# Patient Record
Sex: Male | Born: 1945 | Race: White | Hispanic: No | Marital: Married | Smoking: Former smoker
Health system: Southern US, Community
[De-identification: ages and names within clinical notes are randomized; demographics above are authoritative.]

## PROBLEM LIST (undated history)

## (undated) DIAGNOSIS — R911 Solitary pulmonary nodule: Secondary | ICD-10-CM

## (undated) DIAGNOSIS — I8390 Asymptomatic varicose veins of unspecified lower extremity: Secondary | ICD-10-CM

## (undated) DIAGNOSIS — I700 Atherosclerosis of aorta: Secondary | ICD-10-CM

## (undated) DIAGNOSIS — J449 Chronic obstructive pulmonary disease, unspecified: Secondary | ICD-10-CM

## (undated) DIAGNOSIS — T8859XA Other complications of anesthesia, initial encounter: Secondary | ICD-10-CM

## (undated) DIAGNOSIS — T4145XA Adverse effect of unspecified anesthetic, initial encounter: Secondary | ICD-10-CM

## (undated) DIAGNOSIS — I219 Acute myocardial infarction, unspecified: Secondary | ICD-10-CM

## (undated) DIAGNOSIS — Z87442 Personal history of urinary calculi: Secondary | ICD-10-CM

## (undated) DIAGNOSIS — E785 Hyperlipidemia, unspecified: Secondary | ICD-10-CM

## (undated) DIAGNOSIS — I2510 Atherosclerotic heart disease of native coronary artery without angina pectoris: Secondary | ICD-10-CM

## (undated) DIAGNOSIS — I4510 Unspecified right bundle-branch block: Secondary | ICD-10-CM

## (undated) DIAGNOSIS — Z951 Presence of aortocoronary bypass graft: Secondary | ICD-10-CM

## (undated) DIAGNOSIS — I10 Essential (primary) hypertension: Secondary | ICD-10-CM

## (undated) DIAGNOSIS — I739 Peripheral vascular disease, unspecified: Secondary | ICD-10-CM

## (undated) HISTORY — PX: EYE SURGERY: SHX253

## (undated) HISTORY — DX: Asymptomatic varicose veins of unspecified lower extremity: I83.90

## (undated) HISTORY — PX: CHOLECYSTECTOMY: SHX55

---

## 1995-07-14 HISTORY — PX: OTHER SURGICAL HISTORY: SHX169

## 2000-07-13 HISTORY — PX: SHOULDER SURGERY: SHX246

## 2002-11-03 ENCOUNTER — Emergency Department (HOSPITAL_COMMUNITY): Admission: EM | Admit: 2002-11-03 | Discharge: 2002-11-03 | Payer: Self-pay | Admitting: Internal Medicine

## 2002-11-03 ENCOUNTER — Encounter: Payer: Self-pay | Admitting: Internal Medicine

## 2003-03-23 ENCOUNTER — Inpatient Hospital Stay (HOSPITAL_COMMUNITY): Admission: EM | Admit: 2003-03-23 | Discharge: 2003-03-27 | Payer: Self-pay | Admitting: *Deleted

## 2003-03-23 ENCOUNTER — Encounter: Payer: Self-pay | Admitting: Emergency Medicine

## 2003-03-26 HISTORY — PX: CARDIAC CATHETERIZATION: SHX172

## 2011-04-06 ENCOUNTER — Ambulatory Visit: Payer: Self-pay | Admitting: Family Medicine

## 2012-03-08 ENCOUNTER — Ambulatory Visit: Payer: Self-pay | Admitting: Emergency Medicine

## 2012-10-18 ENCOUNTER — Emergency Department (HOSPITAL_COMMUNITY): Payer: Medicare Other

## 2012-10-18 ENCOUNTER — Encounter (HOSPITAL_COMMUNITY): Payer: Self-pay

## 2012-10-18 ENCOUNTER — Inpatient Hospital Stay (HOSPITAL_COMMUNITY)
Admission: EM | Admit: 2012-10-18 | Discharge: 2012-10-25 | DRG: 234 | Disposition: A | Discharge status: Home / Self Care | Payer: Medicare Other | Attending: Thoracic Surgery (Cardiothoracic Vascular Surgery) | Admitting: Thoracic Surgery (Cardiothoracic Vascular Surgery)

## 2012-10-18 DIAGNOSIS — I200 Unstable angina: Secondary | ICD-10-CM | POA: Diagnosis present

## 2012-10-18 DIAGNOSIS — Z79899 Other long term (current) drug therapy: Secondary | ICD-10-CM

## 2012-10-18 DIAGNOSIS — J4489 Other specified chronic obstructive pulmonary disease: Secondary | ICD-10-CM | POA: Diagnosis present

## 2012-10-18 DIAGNOSIS — D62 Acute posthemorrhagic anemia: Secondary | ICD-10-CM | POA: Diagnosis not present

## 2012-10-18 DIAGNOSIS — J449 Chronic obstructive pulmonary disease, unspecified: Secondary | ICD-10-CM | POA: Diagnosis present

## 2012-10-18 DIAGNOSIS — R079 Chest pain, unspecified: Secondary | ICD-10-CM

## 2012-10-18 DIAGNOSIS — F17200 Nicotine dependence, unspecified, uncomplicated: Secondary | ICD-10-CM | POA: Diagnosis present

## 2012-10-18 DIAGNOSIS — Z951 Presence of aortocoronary bypass graft: Secondary | ICD-10-CM

## 2012-10-18 DIAGNOSIS — I4949 Other premature depolarization: Secondary | ICD-10-CM | POA: Diagnosis not present

## 2012-10-18 DIAGNOSIS — E8779 Other fluid overload: Secondary | ICD-10-CM | POA: Diagnosis not present

## 2012-10-18 DIAGNOSIS — E785 Hyperlipidemia, unspecified: Secondary | ICD-10-CM | POA: Diagnosis present

## 2012-10-18 DIAGNOSIS — I10 Essential (primary) hypertension: Secondary | ICD-10-CM | POA: Diagnosis present

## 2012-10-18 DIAGNOSIS — I2582 Chronic total occlusion of coronary artery: Secondary | ICD-10-CM | POA: Diagnosis present

## 2012-10-18 DIAGNOSIS — Z7982 Long term (current) use of aspirin: Secondary | ICD-10-CM

## 2012-10-18 DIAGNOSIS — J90 Pleural effusion, not elsewhere classified: Secondary | ICD-10-CM | POA: Diagnosis not present

## 2012-10-18 DIAGNOSIS — I2510 Atherosclerotic heart disease of native coronary artery without angina pectoris: Principal | ICD-10-CM | POA: Diagnosis present

## 2012-10-18 HISTORY — DX: Chronic obstructive pulmonary disease, unspecified: J44.9

## 2012-10-18 HISTORY — DX: Hyperlipidemia, unspecified: E78.5

## 2012-10-18 HISTORY — DX: Presence of aortocoronary bypass graft: Z95.1

## 2012-10-18 HISTORY — DX: Essential (primary) hypertension: I10

## 2012-10-18 HISTORY — DX: Atherosclerotic heart disease of native coronary artery without angina pectoris: I25.10

## 2012-10-18 LAB — CBC WITH DIFFERENTIAL/PLATELET
Basophils Absolute: 0 10*3/uL (ref 0.0–0.1)
HCT: 46.8 % (ref 39.0–52.0)
Lymphocytes Relative: 18 % (ref 12–46)
Lymphs Abs: 1.5 10*3/uL (ref 0.7–4.0)
Monocytes Absolute: 0.7 10*3/uL (ref 0.1–1.0)
Neutro Abs: 6.1 10*3/uL (ref 1.7–7.7)
Platelets: 210 10*3/uL (ref 150–400)
RBC: 5.07 MIL/uL (ref 4.22–5.81)
RDW: 12.2 % (ref 11.5–15.5)
WBC: 8.5 10*3/uL (ref 4.0–10.5)

## 2012-10-18 LAB — BASIC METABOLIC PANEL
CO2: 31 mEq/L (ref 19–32)
Chloride: 103 mEq/L (ref 96–112)
Glucose, Bld: 103 mg/dL — ABNORMAL HIGH (ref 70–99)
Sodium: 140 mEq/L (ref 135–145)

## 2012-10-18 LAB — TROPONIN I
Troponin I: <0.3 ng/mL (ref <0.30)
Troponin I: <0.3 ng/mL (ref <0.30)
Troponin I: <0.3 ng/mL (ref <0.30)

## 2012-10-18 LAB — HEPARIN LEVEL (UNFRACTIONATED): Heparin Unfractionated: 0.18 IU/mL — ABNORMAL LOW (ref 0.30–0.70)

## 2012-10-18 MED ORDER — ASPIRIN 325 MG PO TABS: 325.0000 mg | ORAL_TABLET | Freq: Once | ORAL | Status: DC

## 2012-10-18 MED ORDER — ASPIRIN 300 MG RE SUPP
300.0000 mg | RECTAL | Status: AC
  Filled 2012-10-18: qty 1

## 2012-10-18 MED ORDER — ACETAMINOPHEN 325 MG PO TABS
650.0000 mg | ORAL_TABLET | ORAL | Status: DC | PRN
  Administered 2012-10-19 – 2012-10-20 (×3): 650 mg via ORAL
  Filled 2012-10-18 (×3): qty 2

## 2012-10-18 MED ORDER — ASPIRIN 81 MG PO CHEW: 324.0000 mg | CHEWABLE_TABLET | ORAL | Status: AC

## 2012-10-18 MED ORDER — ONDANSETRON HCL 4 MG/2ML IJ SOLN: 4.0000 mg | Freq: Four times a day (QID) | INTRAMUSCULAR | Status: DC | PRN

## 2012-10-18 MED ORDER — ATORVASTATIN CALCIUM 20 MG PO TABS
20.0000 mg | ORAL_TABLET | Freq: Every day | ORAL | Status: DC
  Administered 2012-10-18 – 2012-10-19 (×2): 20 mg via ORAL
  Filled 2012-10-18 (×3): qty 1

## 2012-10-18 MED ORDER — SODIUM CHLORIDE 0.9 % IV SOLN
1.0000 mL/kg/h | INTRAVENOUS | Status: DC
Start: 2012-10-19 — End: 2012-10-19
  Administered 2012-10-19: 1 mL/kg/h via INTRAVENOUS

## 2012-10-18 MED ORDER — AMLODIPINE BESYLATE 5 MG PO TABS
5.0000 mg | ORAL_TABLET | Freq: Every day | ORAL | Status: DC
  Administered 2012-10-18: 5 mg via ORAL
  Filled 2012-10-18 (×3): qty 1

## 2012-10-18 MED ORDER — METOPROLOL TARTRATE 25 MG PO TABS
25.0000 mg | ORAL_TABLET | Freq: Every day | ORAL | Status: DC
  Administered 2012-10-18: 25 mg via ORAL
  Filled 2012-10-18 (×3): qty 1

## 2012-10-18 MED ORDER — NITROGLYCERIN IN D5W 200-5 MCG/ML-% IV SOLN
5.0000 ug/min | INTRAVENOUS | Status: DC
  Administered 2012-10-18: 5 ug/min via INTRAVENOUS
  Filled 2012-10-18: qty 250

## 2012-10-18 MED ORDER — HEPARIN BOLUS VIA INFUSION
4000.0000 [IU] | Freq: Once | INTRAVENOUS | Status: AC
  Administered 2012-10-18: 4000 [IU] via INTRAVENOUS

## 2012-10-18 MED ORDER — HEPARIN BOLUS VIA INFUSION
2500.0000 [IU] | Freq: Once | INTRAVENOUS | Status: AC
  Administered 2012-10-18: 2500 [IU] via INTRAVENOUS
  Filled 2012-10-18: qty 2500

## 2012-10-18 MED ORDER — SODIUM CHLORIDE 0.9 % IJ SOLN: 3.0000 mL | INTRAMUSCULAR | Status: DC | PRN

## 2012-10-18 MED ORDER — HEPARIN (PORCINE) IN NACL 100-0.45 UNIT/ML-% IJ SOLN
10.0000 [IU]/kg/h | Freq: Once | INTRAMUSCULAR | Status: AC
  Administered 2012-10-18: 10 [IU]/kg/h via INTRAVENOUS
  Filled 2012-10-18: qty 250

## 2012-10-18 MED ORDER — SODIUM CHLORIDE 0.9 % IJ SOLN
3.0000 mL | Freq: Two times a day (BID) | INTRAMUSCULAR | Status: DC
  Administered 2012-10-18: 3 mL via INTRAVENOUS

## 2012-10-18 MED ORDER — HEPARIN (PORCINE) IN NACL 100-0.45 UNIT/ML-% IJ SOLN
1250.0000 [IU]/h | INTRAMUSCULAR | Status: DC
  Administered 2012-10-18 – 2012-10-19 (×2): 1250 [IU]/h via INTRAVENOUS
  Filled 2012-10-18 (×3): qty 250

## 2012-10-18 MED ORDER — ASPIRIN 81 MG PO CHEW
324.0000 mg | CHEWABLE_TABLET | ORAL | Status: AC
  Administered 2012-10-19: 324 mg via ORAL
  Filled 2012-10-18: qty 4

## 2012-10-18 MED ORDER — ISOSORBIDE MONONITRATE ER 30 MG PO TB24
30.0000 mg | ORAL_TABLET | Freq: Every day | ORAL | Status: DC
  Administered 2012-10-18: 30 mg via ORAL
  Filled 2012-10-18 (×3): qty 1

## 2012-10-18 MED ORDER — SODIUM CHLORIDE 0.9 % IV SOLN: 250.0000 mL | INTRAVENOUS | Status: DC | PRN

## 2012-10-18 MED ORDER — QUINAPRIL HCL 10 MG PO TABS
20.0000 mg | ORAL_TABLET | Freq: Every day | ORAL | Status: DC
  Administered 2012-10-18 – 2012-10-19 (×2): 20 mg via ORAL
  Filled 2012-10-18 (×3): qty 2

## 2012-10-18 NOTE — Progress Notes (Signed)
Utilization Review Completed.[name] T4/02/2013

## 2012-10-18 NOTE — Progress Notes (Addendum)
ANTICOAGULATION CONSULT NOTE - Initial Consult  Pharmacy Consult for Heparin Indication: chest pain/ACS  No Known Allergies  Patient Measurements: Height: 5\' 7"  (170.2 cm) Weight: 210 lb (95.255 kg) IBW/kg (Calculated) : 66.1 Heparin Dosing Weight: 86.4kg  Vital Signs: Temp: 97.7 F (36.5 C) (04/08 1516) Temp src: Oral (04/08 1700) BP: 158/90 mmHg (04/08 1700) Pulse Rate: 70 (04/08 1700)  Labs:  Recent Labs  10/18/12 1029  HGB 15.5  HCT 46.8  PLT 210  CREATININE 0.80  TROPONINI <0.30    Estimated Creatinine Clearance: 97.3 ml/min (by C-G formula based on Cr of 0.8).   Medical History: Past Medical History  Diagnosis Date  . Coronary artery disease   . Hypertension     Medications:  Prescriptions prior to admission  Medication Sig Dispense Refill  . amLODipine (NORVASC) 5 MG tablet Take 5 mg by mouth daily.      Marland Kitchen aspirin 81 MG chewable tablet Chew 81 mg by mouth daily.      Marland Kitchen atorvastatin (LIPITOR) 20 MG tablet Take 20 mg by mouth daily.      . isosorbide mononitrate (IMDUR) 30 MG 24 hr tablet Take 30 mg by mouth daily.      . metoprolol (LOPRESSOR) 50 MG tablet Take 25 mg by mouth daily.      . nitroGLYCERIN (NITROSTAT) 0.4 MG SL tablet Place 0.4 mg under the tongue every 5 (five) minutes as needed for chest pain.      Marland Kitchen quinapril (ACCUPRIL) 20 MG tablet Take 20 mg by mouth at bedtime.        Assessment: 66yom continuing heparin for CP/ACS. Patient was started on heparin (4000 unit bolus, then 950 units/hr) at [facility] ~ 1330 today. Patient reports no bleeding and not on any anticoagulants at home.  - H/H and Plts wnl - No baseline INR - Heparin weight: 86.4kg - CrCl 97 ml/min  Goal of Therapy:  Heparin level 0.3-0.7 units/ml Monitor platelets by anticoagulation protocol: Yes   Plan:  1. Continue heparin drip 950 units/hr (9.5 ml/hr) 2. Check heparin level 6 hours after initiation (~1930) 3. Daily heparin level and CBC  Cleon Dew [phone] 10/18/2012,5:20 PM   Addendum: Initial heparin level (0.18) is subtherapeutic for ACS dosing - will bolus and increase heparin rate. - No problems with line/infusion per RN  Plan: 1. Heparin IV bolus 2500 units x 1 2. Increase heparin drip to 1250 units/hr (12.5 ml/hr) 3. Check heparin level 6 hours after rate increase  Wilfred Lacy, PharmD Clinical Pharmacist [phone] 10/18/2012, 8:26 PM

## 2012-10-18 NOTE — ED Provider Notes (Signed)
History     This chart was scribed for Donnetta Hutching, MD, MD by Smitty Pluck, ED Scribe. The patient was seen in room APA07/APA07 and the patient's care was started at 10:50 AM.   CSN: [id number]  Arrival date & time 10/18/12  1010       Chief Complaint  Patient presents with  . Chest Pain     The history is provided by the patient, medical records and a relative. No language interpreter was used.   [name] is a 67 y.o. male with h/o CAD and HTN who presents to the Emergency Department via EMS complaining of intermittent, sharp, substernal chest pain radiating to neck for 2-3 weeks. 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 He reports that excersion aggravates the pain. He reports hx of catheretization in 2004 by [facility] showing a blockage of uncertain anatomical location and severity. He denies hx of stents, MI, stroke and any other heart complications. He states that he takes nitro sometimes but it is very rare. Pt denies diaphoresis, fever, chills, nausea, vomiting, diarrhea, weakness, cough, SOB and any other pain. He reports that he smokes cigarettes.    Cardiologist is Dr. Rennis Golden    Past Medical History  Diagnosis Date  . Coronary artery disease   . Hypertension     Past Surgical History  Procedure Laterality Date  . Cholecystectomy      No family history on file.  History  Substance Use Topics  . Smoking status: Current Every Day Smoker  . Smokeless tobacco: Not on file  . Alcohol Use: No      Review of Systems 10 Systems reviewed and all are negative for acute change except as noted in the HPI.   Allergies  Review of patient's allergies indicates no known allergies.  Home Medications  No current outpatient prescriptions on file.  BP 159/88  Pulse 65  Temp(Src) 97.9 F (36.6 C) (Oral)  Resp 20  Ht 5\' 7"  (1.702 m)  Wt 210 lb (95.255 kg)  BMI 32.88 kg/m2  SpO2  96%  Physical Exam  Nursing note and vitals reviewed. Constitutional: He is oriented to person, place, and time. He appears well-developed and well-nourished.  HENT:  Head: Normocephalic and atraumatic.  Eyes: Conjunctivae and EOM are normal. Pupils are equal, round, and reactive to light.  Neck: Normal range of motion. Neck supple.  Cardiovascular: Normal rate, regular rhythm and normal heart sounds.   Pulmonary/Chest: Effort normal and breath sounds normal.  Abdominal: Soft. Bowel sounds are normal.  Musculoskeletal: Normal range of motion.  Neurological: He is alert and oriented to person, place, and time.  Skin: Skin is warm and dry.  Psychiatric: He has a normal mood and affect.    ED Course  Procedures (including critical care time) DIAGNOSTIC STUDIES: Oxygen Saturation is 96% on [place], adequate by my interpretation.    COORDINATION OF CARE: 10:57 AM Discussed ED treatment with pt and pt agrees.    Results for orders placed during the hospital encounter of 10/18/12  CBC WITH DIFFERENTIAL      Result Value Range   WBC 8.5  4.0 - 10.5 K/uL   RBC 5.07  4.22 - 5.81 MIL/uL   Hemoglobin 15.5  13.0 - 17.0 g/dL   HCT 40.9  81.1 - 91.4 %   MCV 92.3  78.0 - 100.0 fL   MCH 30.6  26.0 - 34.0 pg  MCHC 33.1  30.0 - 36.0 g/dL   RDW 46.9  62.9 - 52.8 %   Platelets 210  150 - 400 K/uL   Neutrophils Relative 72  43 - 77 %   Neutro Abs 6.1  1.7 - 7.7 K/uL   Lymphocytes Relative 18  12 - 46 %   Lymphs Abs 1.5  0.7 - 4.0 K/uL   Monocytes Relative 8  3 - 12 %   Monocytes Absolute 0.7  0.1 - 1.0 K/uL   Eosinophils Relative 2  0 - 5 %   Eosinophils Absolute 0.2  0.0 - 0.7 K/uL   Basophils Relative 0  0 - 1 %   Basophils Absolute 0.0  0.0 - 0.1 K/uL  BASIC METABOLIC PANEL      Result Value Range   Sodium 140  135 - 145 mEq/L   Potassium 4.0  3.5 - 5.1 mEq/L   Chloride 103  96 - 112 mEq/L   CO2 31  19 - 32 mEq/L   Glucose, Bld 103 (*) 70 - 99 mg/dL   BUN 9  6 - 23 mg/dL    Creatinine, Ser 4.13  0.50 - 1.35 mg/dL   Calcium 9.1  8.4 - 24.4 mg/dL   GFR calc non Af Amer 90 (*) >90 mL/min   GFR calc Af Amer >90  >90 mL/min  TROPONIN I      Result Value Range   Troponin I <0.30  <0.30 ng/mL     Dg Chest Portable 1 View  10/18/2012  *RADIOLOGY REPORT*  Clinical Data: Chest pain.  Current history of hypertension and coronary artery disease.  PORTABLE CHEST - 1 VIEW 1014 1030 hours:  Comparison: None.  Findings: Cardiac silhouette enlarged.  Thoracic aorta atherosclerotic.  Hilar and mediastinal contours otherwise unremarkable.  Lungs clear.  Bronchovascular markings normal. Pulmonary vascularity normal.  No pneumothorax.  No pleural effusions.  Elevation of the right hemidiaphragm which is likely chronic.  IMPRESSION: Cardiomegaly.  No acute cardiopulmonary disease.   Original Report Authenticated By: Hulan Saas, M.D.      No diagnosis found.  Date: 10/18/2012  Rate: 65  Rhythm: normal sinus rhythm  QRS Axis: rightward  Intervals: normal  ST/T Wave abnormalities: normal  Conduction Disutrbances: none  Narrative Interpretation: unremarkable  CRITICAL CARE Performed by: Donnetta Hutching  ?  Total critical care time: 30  Critical care time was exclusive of separately billable procedures and treating other patients.  Critical care was necessary to treat or prevent imminent or life-threatening deterioration.  Critical care was time spent personally by me on the following activities: development of treatment plan with patient and/or surrogate as well as nursing, discussions with consultants, evaluation of patient's response to treatment, examination of patient, obtaining history from patient or surrogate, ordering and performing treatments and interventions, ordering and review of laboratory studies, ordering and review of radiographic studies, pulse oximetry and re-evaluation of patient's condition.    MDM  Patient with known coronary artery disease and risk  factors presents with substernal chest pain for 3 weeks, worse with exertion. EKG and troponin negative. Discussed with Dr. Allyson Sabal.  IV nitroglycerin and IV heparin started. Transfer to [facility].   I personally performed the services described in this documentation, which was scribed in my presence. The recorded information has been reviewed and is accurate.       Donnetta Hutching, MD 10/18/12 [id number]

## 2012-10-18 NOTE — H&P (Signed)
[name] is an 67 y.o. male.   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  He has a history of CAD, HTN, dyslipidemia, varicose veins with LEE and kidney stones.  He had a coronary angiogram in 2004 which showed an occluded RCA with left to right collaterals.  He also had a 60% lesion in the mid circumflex and 40% in the OM.  He presented to [facility] with chest pain.   He reports progressive anginal symptoms for the last 3-4 weeks which are responsive to NTG.  He reports radiation to neck and jaw.  He denies N, V, fever, diaphoresis, dizziness, orthopnea, PND, cough congestion, Abd pain, hematuria, hematochezia, melena.   EKG wit subtle ST depression in V5-6.  Medications: Prior to Admission medications   Medication Sig Start Date End Date Taking? Authorizing Provider  amLODipine (NORVASC) 5 MG tablet Take 5 mg by mouth daily.   Yes Historical Provider, MD  aspirin 81 MG chewable tablet Chew 81 mg by mouth daily.   Yes Historical Provider, MD  atorvastatin (LIPITOR) 20 MG tablet Take 20 mg by mouth daily.   Yes Historical Provider, MD  isosorbide mononitrate (IMDUR) 30 MG 24 hr tablet Take 30 mg by mouth daily.   Yes Historical Provider, MD  metoprolol (LOPRESSOR) 50 MG tablet Take 25 mg by mouth daily.   Yes Historical Provider, MD  nitroGLYCERIN (NITROSTAT) 0.4 MG SL tablet Place 0.4 mg under the tongue every 5 (five) minutes as needed for chest pain.   Yes Historical Provider, MD  quinapril (ACCUPRIL) 20 MG tablet Take 20 mg by mouth at bedtime.   Yes Historical Provider, MD    Past Medical History  Diagnosis Date  . Coronary artery disease   . Hypertension     Past Surgical History  Procedure Laterality Date  . Cholecystectomy      No family history on file. Social History:  reports that he has been smoking.  He does not have any smokeless tobacco history on file. He reports that he does not drink alcohol  or use illicit drugs.  Allergies: No Known Allergies   (Not in a hospital admission)  Results for orders placed during the hospital encounter of 10/18/12 (from the past 48 hour(s))  CBC WITH DIFFERENTIAL     Status: None   Collection Time    10/18/12 10:29 AM      Result Value Range   WBC 8.5  4.0 - 10.5 K/uL   RBC 5.07  4.22 - 5.81 MIL/uL   Hemoglobin 15.5  13.0 - 17.0 g/dL   HCT 16.1  09.6 - 04.5 %   MCV 92.3  78.0 - 100.0 fL   MCH 30.6  26.0 - 34.0 pg   MCHC 33.1  30.0 - 36.0 g/dL   RDW 40.9  81.1 - 91.4 %   Platelets 210  150 - 400 K/uL   Neutrophils Relative 72  43 - 77 %   Neutro Abs 6.1  1.7 - 7.7 K/uL   Lymphocytes Relative 18  12 - 46 %   Lymphs Abs 1.5  0.7 - 4.0 K/uL   Monocytes Relative 8  3 - 12 %   Monocytes Absolute 0.7  0.1 - 1.0 K/uL   Eosinophils Relative 2  0 - 5 %   Eosinophils Absolute 0.2  0.0 - 0.7 K/uL   Basophils Relative 0  0 - 1 %   Basophils Absolute 0.0  0.0 - 0.1 K/uL  BASIC METABOLIC PANEL     Status: Abnormal   Collection Time    10/18/12 10:29 AM      Result Value Range   Sodium 140  135 - 145 mEq/L   Potassium 4.0  3.5 - 5.1 mEq/L   Chloride 103  96 - 112 mEq/L   CO2 31  19 - 32 mEq/L   Glucose, Bld 103 (*) 70 - 99 mg/dL   BUN 9  6 - 23 mg/dL   Creatinine, Ser 4.74  0.50 - 1.35 mg/dL   Calcium 9.1  8.4 - 25.9 mg/dL   GFR calc non Af Amer 90 (*) >90 mL/min   GFR calc Af Amer >90  >90 mL/min   Comment:            The eGFR has been calculated     using the CKD EPI equation.     This calculation has not been     validated in all clinical     situations.     eGFR's persistently     <90 mL/min signify     possible Chronic Kidney Disease.  TROPONIN I     Status: None   Collection Time    10/18/12 10:29 AM      Result Value Range   Troponin I <0.30  <0.30 ng/mL   Comment:            Due to the release kinetics of cTnI,     a negative result within the first hours     of the onset of symptoms does not rule out     myocardial  infarction with certainty.     If myocardial infarction is still suspected,     repeat the test at appropriate intervals.   Dg Chest Portable 1 View  10/18/2012  *RADIOLOGY REPORT*  Clinical Data: Chest pain.  Current history of hypertension and coronary artery disease.  PORTABLE CHEST - 1 VIEW 1014 1030 hours:  Comparison: None.  Findings: Cardiac silhouette enlarged.  Thoracic aorta atherosclerotic.  Hilar and mediastinal contours otherwise unremarkable.  Lungs clear.  Bronchovascular markings normal. Pulmonary vascularity normal.  No pneumothorax.  No pleural effusions.  Elevation of the right hemidiaphragm which is likely chronic.  IMPRESSION: Cardiomegaly.  No acute cardiopulmonary disease.   Original Report Authenticated By: Hulan Saas, M.D.     Review of Systems  Constitutional: Negative for fever and diaphoresis.  HENT: Positive for neck pain. Negative for congestion and sore throat.   Respiratory: Negative for cough and shortness of breath.   Cardiovascular: Positive for chest pain and leg swelling. Negative for orthopnea and PND.  Gastrointestinal: Negative for nausea, vomiting, abdominal pain, diarrhea, constipation, blood in stool and melena.  Genitourinary: Negative for dysuria and hematuria.  Neurological: Negative for dizziness.    Blood pressure 152/77, pulse 68, temperature 97.9 F (36.6 C), temperature source Oral, resp. rate 20, height 5\' 7"  (1.702 m), weight 95.255 kg (210 lb), SpO2 96.00%. Physical Exam  Constitutional: He is oriented to person, place, and time. He appears well-developed and well-nourished. No distress.  HENT:  Head: Normocephalic and atraumatic.  Eyes: EOM are normal. Pupils are equal, round, and reactive to light.  Neck: Normal range of motion. Neck supple. No JVD present.  Cardiovascular: Normal rate, regular rhythm and S1 normal.  Exam reveals gallop and S3.   No murmur heard. Pulses:  Radial pulses are 2+ on the right side, and 2+ on the  left side.       Dorsalis pedis pulses are 2+ on the right side, and 2+ on the left side.  No Carotid Bruits  Respiratory: Effort normal. He has wheezes (Left base).  GI: Soft. Bowel sounds are normal. He exhibits no distension. There is no tenderness.  Musculoskeletal: He exhibits no edema.  Lymphadenopathy:    He has no cervical adenopathy.  Neurological: He is alert and oriented to person, place, and time. He exhibits normal muscle tone.  Skin: Skin is warm and dry.  Psychiatric: He has a normal mood and affect.     Assessment/Plan  Principal Problem:   Chest pain Active Problems:   HTN (hypertension)   Dyslipidemia   CAD (coronary artery disease)  Plan:  The patient was transferred to [facility] stepdown on IV heparin and NTG.  We will have the lab draw troponin x3 Q6hr, TSH, lipids, A1C.  EKG in the morning.   Left heart cath tomorrow.    Wilburt Finlay 10/18/2012, 2:47 PM    Agree with note written by Jones Skene PAC  Pt with known CAD by cath 10 years ago, + CRF including ongoing tobacco abuse and recent crescendo CP c/w [place]. Currently pain free on iv hep/ntg. Exam benign. Labs OK. Enz neg. EKG w/o acute change. Plan cardiac cath Wed. Pt agreeable.   Runell Gess 10/19/2012 5:45 AM

## 2012-10-18 NOTE — ED Notes (Addendum)
Pt reports had cath done in 2004.  Reports has had very little chest pain since then.  Reports once in a while would have some chest pain with exertion.  Reports for the past couple of weeks has had chest pain more frequently.  Reports has taken more nitro in the past 2 weeks than he has since 2004.  EMS reports pt took one nitro at home and went to his doctor's office.   His pcp gave him one nitro and 4 baby aspirins  in the office today and called EMS.    BP 158/80.  HR 69, 100% on 2liters.  PT says pain has been radiating into both jaws.

## 2012-10-19 ENCOUNTER — Encounter (HOSPITAL_COMMUNITY): Payer: Self-pay | Admitting: Thoracic Surgery (Cardiothoracic Vascular Surgery)

## 2012-10-19 ENCOUNTER — Inpatient Hospital Stay (HOSPITAL_COMMUNITY): Payer: Medicare Other

## 2012-10-19 ENCOUNTER — Encounter (HOSPITAL_COMMUNITY)
Admission: EM | Disposition: A | Discharge status: Home / Self Care | Payer: Self-pay | Source: Home / Self Care | Attending: Thoracic Surgery (Cardiothoracic Vascular Surgery)

## 2012-10-19 ENCOUNTER — Other Ambulatory Visit: Payer: Self-pay | Admitting: *Deleted

## 2012-10-19 DIAGNOSIS — Z01810 Encounter for preprocedural cardiovascular examination: Secondary | ICD-10-CM

## 2012-10-19 DIAGNOSIS — I2510 Atherosclerotic heart disease of native coronary artery without angina pectoris: Secondary | ICD-10-CM

## 2012-10-19 DIAGNOSIS — F17200 Nicotine dependence, unspecified, uncomplicated: Secondary | ICD-10-CM | POA: Diagnosis present

## 2012-10-19 HISTORY — PX: LEFT HEART CATHETERIZATION WITH CORONARY ANGIOGRAM: SHX5451

## 2012-10-19 LAB — CBC
HCT: 44.5 % (ref 39.0–52.0)
Hemoglobin: 14.9 g/dL (ref 13.0–17.0)
MCV: 91.6 fL (ref 78.0–100.0)
RDW: 12.3 % (ref 11.5–15.5)
WBC: 10.6 10*3/uL — ABNORMAL HIGH (ref 4.0–10.5)

## 2012-10-19 LAB — URINALYSIS, ROUTINE W REFLEX MICROSCOPIC
Bilirubin Urine: NEGATIVE
Hgb urine dipstick: NEGATIVE
Ketones, ur: NEGATIVE mg/dL
Specific Gravity, Urine: 1.012 (ref 1.005–1.030)
Urobilinogen, UA: 1 mg/dL (ref 0.0–1.0)
pH: 6.5 (ref 5.0–8.0)

## 2012-10-19 LAB — BASIC METABOLIC PANEL
BUN: 10 mg/dL (ref 6–23)
CO2: 29 mEq/L (ref 19–32)
Calcium: 8.6 mg/dL (ref 8.4–10.5)
Creatinine, Ser: 0.82 mg/dL (ref 0.50–1.35)
Glucose, Bld: 97 mg/dL (ref 70–99)

## 2012-10-19 LAB — HEPARIN LEVEL (UNFRACTIONATED): Heparin Unfractionated: 0.42 IU/mL (ref 0.30–0.70)

## 2012-10-19 LAB — LIPID PANEL
HDL: 33 mg/dL — ABNORMAL LOW (ref >39)
LDL Cholesterol: 67 mg/dL (ref 0–99)
Total CHOL/HDL Ratio: 3.5 RATIO
Triglycerides: 83 mg/dL (ref <150)
VLDL: 17 mg/dL (ref 0–40)

## 2012-10-19 LAB — TROPONIN I: Troponin I: <0.3 ng/mL (ref <0.30)

## 2012-10-19 LAB — TSH: TSH: 0.985 u[IU]/mL (ref 0.350–4.500)

## 2012-10-19 LAB — POCT ACTIVATED CLOTTING TIME: Activated Clotting Time: 138 seconds

## 2012-10-19 LAB — HEMOGLOBIN A1C: Hgb A1c MFr Bld: 5.5 % (ref <5.7)

## 2012-10-19 LAB — SURGICAL PCR SCREEN: Staphylococcus aureus: NEGATIVE

## 2012-10-19 LAB — TYPE AND SCREEN: ABO/RH(D): A POS

## 2012-10-19 SURGERY — LEFT HEART CATHETERIZATION WITH CORONARY ANGIOGRAM
Anesthesia: LOCAL

## 2012-10-19 MED ORDER — EPINEPHRINE HCL 1 MG/ML IJ SOLN
0.5000 ug/min | INTRAVENOUS | Status: DC
  Filled 2012-10-19: qty 4

## 2012-10-19 MED ORDER — VANCOMYCIN HCL 10 G IV SOLR
1250.0000 mg | INTRAVENOUS | Status: AC
  Administered 2012-10-20: 1250 mg via INTRAVENOUS
  Filled 2012-10-19: qty 1250

## 2012-10-19 MED ORDER — CHLORHEXIDINE GLUCONATE 4 % EX LIQD
60.0000 mL | Freq: Once | CUTANEOUS | Status: AC
  Administered 2012-10-19: 4 via TOPICAL
  Filled 2012-10-19: qty 60

## 2012-10-19 MED ORDER — FENTANYL CITRATE 0.05 MG/ML IJ SOLN
INTRAMUSCULAR | Status: AC
  Filled 2012-10-19: qty 2

## 2012-10-19 MED ORDER — SODIUM CHLORIDE 0.9 % IJ SOLN
3.0000 mL | Freq: Two times a day (BID) | INTRAMUSCULAR | Status: DC
  Administered 2012-10-19: 19:00:00 via INTRAVENOUS

## 2012-10-19 MED ORDER — LIDOCAINE HCL (PF) 1 % IJ SOLN
INTRAMUSCULAR | Status: AC
  Filled 2012-10-19: qty 30

## 2012-10-19 MED ORDER — PHENYLEPHRINE HCL 10 MG/ML IJ SOLN
30.0000 ug/min | INTRAVENOUS | Status: DC
  Filled 2012-10-19: qty 2

## 2012-10-19 MED ORDER — VANCOMYCIN HCL 1000 MG IV SOLR
INTRAVENOUS | Status: AC
  Administered 2012-10-20: 12:00:00
  Filled 2012-10-19: qty 1000

## 2012-10-19 MED ORDER — SODIUM CHLORIDE 0.9 % IV SOLN
INTRAVENOUS | Status: AC
  Administered 2012-10-20: 1 [IU]/h via INTRAVENOUS
  Filled 2012-10-19: qty 1

## 2012-10-19 MED ORDER — HEPARIN (PORCINE) IN NACL 2-0.9 UNIT/ML-% IJ SOLN
INTRAMUSCULAR | Status: AC
  Filled 2012-10-19: qty 1000

## 2012-10-19 MED ORDER — HEPARIN (PORCINE) IN NACL 100-0.45 UNIT/ML-% IJ SOLN
1250.0000 [IU]/h | INTRAMUSCULAR | Status: DC
Start: 2012-10-19 — End: 2012-10-20
  Administered 2012-10-19: 1250 [IU]/h via INTRAVENOUS
  Filled 2012-10-19: qty 250

## 2012-10-19 MED ORDER — OXYCODONE-ACETAMINOPHEN 5-325 MG PO TABS: 1.0000 | ORAL_TABLET | ORAL | Status: DC | PRN

## 2012-10-19 MED ORDER — NITROGLYCERIN IN D5W 200-5 MCG/ML-% IV SOLN
2.0000 ug/min | INTRAVENOUS | Status: AC
  Administered 2012-10-20 (×2): 10 ug/min via INTRAVENOUS
  Filled 2012-10-19: qty 250

## 2012-10-19 MED ORDER — SODIUM CHLORIDE 0.9 % IV SOLN
INTRAVENOUS | Status: AC
  Administered 2012-10-20: 14 mL/h via INTRAVENOUS
  Filled 2012-10-19: qty 40

## 2012-10-19 MED ORDER — DEXTROSE 5 % IV SOLN
750.0000 mg | INTRAVENOUS | Status: DC
  Filled 2012-10-19 (×2): qty 750

## 2012-10-19 MED ORDER — MIDAZOLAM HCL 2 MG/2ML IJ SOLN
INTRAMUSCULAR | Status: AC
  Filled 2012-10-19: qty 2

## 2012-10-19 MED ORDER — CHLORHEXIDINE GLUCONATE 4 % EX LIQD
60.0000 mL | Freq: Once | CUTANEOUS | Status: AC
  Administered 2012-10-20: 4 via TOPICAL
  Filled 2012-10-19 (×2): qty 60

## 2012-10-19 MED ORDER — PLASMA-LYTE 148 IV SOLN
INTRAVENOUS | Status: AC
  Administered 2012-10-20: 10:00:00
  Filled 2012-10-19: qty 2.5

## 2012-10-19 MED ORDER — DEXMEDETOMIDINE HCL IN NACL 400 MCG/100ML IV SOLN
0.1000 ug/kg/h | INTRAVENOUS | Status: AC
  Administered 2012-10-20: 0.2 ug/kg/h via INTRAVENOUS
  Filled 2012-10-19: qty 100

## 2012-10-19 MED ORDER — ALBUTEROL SULFATE (5 MG/ML) 0.5% IN NEBU
2.5000 mg | INHALATION_SOLUTION | Freq: Once | RESPIRATORY_TRACT | Status: AC
  Administered 2012-10-19: 2.5 mg via RESPIRATORY_TRACT

## 2012-10-19 MED ORDER — POTASSIUM CHLORIDE 2 MEQ/ML IV SOLN
80.0000 meq | INTRAVENOUS | Status: DC
  Filled 2012-10-19: qty 40

## 2012-10-19 MED ORDER — SODIUM CHLORIDE 0.9 % IJ SOLN
3.0000 mL | INTRAMUSCULAR | Status: DC | PRN
  Administered 2012-10-19: 19:00:00 via INTRAVENOUS

## 2012-10-19 MED ORDER — CEFUROXIME SODIUM 1.5 G IJ SOLR
1.5000 g | INTRAMUSCULAR | Status: AC
  Administered 2012-10-20: 1.5 g via INTRAVENOUS
  Administered 2012-10-20: .75 g via INTRAVENOUS
  Filled 2012-10-19: qty 1.5

## 2012-10-19 MED ORDER — BISACODYL 5 MG PO TBEC: 5.0000 mg | DELAYED_RELEASE_TABLET | Freq: Once | ORAL | Status: DC

## 2012-10-19 MED ORDER — METOPROLOL TARTRATE 12.5 MG HALF TABLET
12.5000 mg | ORAL_TABLET | Freq: Once | ORAL | Status: AC
  Administered 2012-10-20: 12.5 mg via ORAL
  Filled 2012-10-19: qty 1

## 2012-10-19 MED ORDER — MAGNESIUM SULFATE 50 % IJ SOLN
40.0000 meq | INTRAMUSCULAR | Status: DC
  Filled 2012-10-19: qty 10

## 2012-10-19 MED ORDER — SODIUM CHLORIDE 0.9 % IV SOLN: 1.0000 mL/kg/h | INTRAVENOUS | Status: AC

## 2012-10-19 MED ORDER — SODIUM CHLORIDE 0.9 % IV SOLN: 250.0000 mL | INTRAVENOUS | Status: DC | PRN

## 2012-10-19 MED ORDER — DOPAMINE-DEXTROSE 3.2-5 MG/ML-% IV SOLN
2.0000 ug/kg/min | INTRAVENOUS | Status: DC
  Filled 2012-10-19: qty 250

## 2012-10-19 MED ORDER — TEMAZEPAM 15 MG PO CAPS: 15.0000 mg | ORAL_CAPSULE | Freq: Once | ORAL | Status: AC | PRN

## 2012-10-19 MED ORDER — NITROGLYCERIN IN D5W 200-5 MCG/ML-% IV SOLN
2.0000 ug/min | INTRAVENOUS | Status: AC
  Administered 2012-10-19: 20 ug/min via INTRAVENOUS

## 2012-10-19 NOTE — Progress Notes (Signed)
ANTICOAGULATION CONSULT NOTE - Follow-Up Consult  Pharmacy Consult for Heparin Indication: chest pain/ACS  No Known Allergies  Patient Measurements: Height: 5\' 7"  (170.2 cm) Weight: 208 lb 15.9 oz (94.8 kg) IBW/kg (Calculated) : 66.1 Heparin Dosing Weight: 86.4kg  Vital Signs: Temp: 97.6 F (36.4 C) (04/09 1140) Temp src: Oral (04/09 1140) BP: 144/81 mmHg (04/09 1230) Pulse Rate: 63 (04/09 1245)  Labs:  Recent Labs  10/18/12 1029 10/18/12 1731 10/18/12 1913 10/18/12 2213 10/19/12 0455  HGB 15.5  --   --   --  14.9  HCT 46.8  --   --   --  44.5  PLT 210  --   --   --  214  LABPROT  --   --   --   --  13.4  INR  --   --   --   --  1.03  HEPARINUNFRC  --   --  0.18*  --  0.42  CREATININE 0.80  --   --   --  0.82  TROPONINI <0.30 <0.30  --  <0.30 <0.30    Estimated Creatinine Clearance: 94.6 ml/min (by C-G formula based on Cr of 0.82).   Medical History: Past Medical History  Diagnosis Date  . Coronary artery disease   . Hypertension     Medications:  Prescriptions prior to admission  Medication Sig Dispense Refill  . amLODipine (NORVASC) 5 MG tablet Take 5 mg by mouth daily.      Marland Kitchen aspirin 81 MG chewable tablet Chew 81 mg by mouth daily.      Marland Kitchen atorvastatin (LIPITOR) 20 MG tablet Take 20 mg by mouth daily.      . isosorbide mononitrate (IMDUR) 30 MG 24 hr tablet Take 30 mg by mouth daily.      . metoprolol (LOPRESSOR) 50 MG tablet Take 25 mg by mouth daily.      . nitroGLYCERIN (NITROSTAT) 0.4 MG SL tablet Place 0.4 mg under the tongue every 5 (five) minutes as needed for chest pain.      Marland Kitchen quinapril (ACCUPRIL) 20 MG tablet Take 20 mg by mouth at bedtime.       . sodium chloride    . heparin    . nitroGLYCERIN 10 mcg/min (10/19/12 0440)  . [DISCONTINUED] sodium chloride 1 mL/kg/hr (10/19/12 0353)  . [DISCONTINUED] heparin 1,250 Units/hr (10/19/12 0353)    Assessment: 66yom to resume heparin post-cath due to CAD, CABG referral.  Heparin level previously  therapeutic this AM on 1250 units/hr.  Heparin was turned off prior to cath lab.  Per RN, groin site looks okay so far.  No other bleeding or complications noted, CBC stable.  Sheath pulled in cath lab holding area at 1040 AM.  Goal of Therapy:  Heparin level 0.3-0.7 units/ml Monitor platelets by anticoagulation protocol: Yes   Plan:  1. Will resume IV heparin at 1845 PM at 1250 units/hr.  No bolus. 2. Check a heparin level 6 hrs after gtt resumes. 3. Daily heparin level and CBC. 4. F/U for plans for CABG.  Tad Moore, BCPS  Clinical Pharmacist Pager [phone]  10/19/2012 12:52 PM

## 2012-10-19 NOTE — Progress Notes (Signed)
PFT completed at bedside w [facility] @ 27 degrees due to Femoral Cath requirements. Unconfirmed results to be  placed in Shadow Chart.

## 2012-10-19 NOTE — Progress Notes (Addendum)
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  Left:  No evidence of hemodynamically significant internal carotid artery stenosis.  Bilateral:  Vertebral artery flow is antegrade.     [name] , RVT 10/19/2012 1:21 PM     Upper Extremity Right Left  Brachial Pressures 147  Triphasic  147  Triphasic   Radial Waveforms Triphasic  Triphasic   Ulnar Waveforms Monophasic  Monophasic   Palmar Arch (Allen's Test) Doppler obliterates with radial compression, normal with ulnar compression. Doppler obliterates with radial compression, normal with ulnar compression.   [name], RVT 10/19/2012 4:44 PM

## 2012-10-19 NOTE — CV Procedure (Addendum)
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 He has a history of CAD, HTN, dyslipidemia, varicose veins with LEE and kidney stones. He had a coronary angiogram in 2004 which showed an occluded RCA with left to right collaterals. He also had a 60% lesion in the mid circumflex and 40% in the OM. He presented to [name] with chest pain. He reports progressive anginal symptoms for the last 3-4 weeks which are responsive to NTG. He reports radiation to neck and jaw. He denies N, V, fever, diaphoresis, dizziness, orthopnea, PND, cough congestion, Abd pain, hematuria, hematochezia, melena. EKG wit subtle ST depression in V5-6.  Consent: The procedure with Risks/Benefits/Alternatives and Indications were reviewed with the patient (and family).  All questions were answered.    Risks / Complications include, but not limited to: Death, MI, CVA/TIA, VF/VT (with defibrillation), Bradycardia (need for temporary pacer placement), contrast induced nephropathy, bleeding / bruising / hematoma / pseudoaneurysm, vascular or coronary injury (with possible emergent CT or Vascular Surgery), adverse medication reactions, infection.    Consent: Risks of procedure as well as the alternatives and risks of each were explained to the (patient/caregiver).  Consent for procedure obtained.  Procedure: The patient was brought to the 2nd Floor [name] in the fasting state and prepped and draped in the usual sterile fashion for (Left groin access).    Time Out: Verified patient identification, verified procedure, site/side was marked, verified correct patient position, special equipment/implants available, radiation safety measures in place (including badges and shielding), medications/allergies/relevent history reviewed, required imaging and test results available.  Performed  Procedure: The right femoral head was identified using tactile and fluoroscopic technique.  The right groin was anesthetized with 1% subcutaneous Lidocaine.  The right Common Femoral Artery was accessed using the Modified Seldinger Technique. The wire was advanced however it was noted to have some resistance.  Fluoroscopy demonstrated the wire was tortuous with a 90 degree bend that blocked advancement of the dilator and sheath.  The wire was then withdrawn and direct pressure was held on the right femoral artery for 10 minutes.  Once hemostasis was achieved, the left groin was prepped and draped in the usual sterile fashion. Using the same described technique, the vessel was easily accessed and placement of (5 Fr) sheath was performed using the Seldinger technique.  The sheath was aspirated and flushed.  A 5 Fr JL4 Catheter was advanced of over a Standard J wire into the ascending Aorta.  The catheter was used to engage the left coronary artery.  Multiple cineangiographic views of the left coronary artery system(s) were performed. The right coronary artery was not engaged as it is known to be occluded.  This catheter was then exchanged over the Standard J wire for an angled Pigtail catheter that was advanced across the Aortic Valve.  LV hemodynamics were measured (and Left Ventriculography was performed).  LV hemodynamics were then re-sampled, and the catheter was pulled back across the Aortic Valve for  measurement of "pull-back" gradient.  The catheter and the  wire was removed completely out of the body. The patient was transferred to the holding area where the sheath was removed with manual pressure held for hemostasis.   Recovery: The patient was transported to the cath lab holding area in stable condition.   The patient  was stable before, during and following the procedure.   Patient did tolerate procedure well. There were not complications.  EBL: Minimal  Medications:  Premedication: None  Sedation:  1 mg IV Versed, 50 mcg IV Fentanyl  Contrast:  100 ml Omnipaque  5 cc 1% lidocaine  Hemodynamics:  Central Aortic Pressure / Mean Aortic Pressure: 130/71  LV Pressure / LV End diastolic Pressure:  20  Left Ventriculography:  EF:  55-60%  Wall Motion: Normal wall motion, 1+ MR  Coronary Angiographic Data:  Left Main:  Large caliber 4.21mm vessel, tapers distally to >90% stenosis.  Left Anterior Descending (LAD):  There is 95% ostial stenosis of the LAD which reaches the apex. There is diffuse mild stenosis of the LAD. There are 3 large septal perforator branches which give off collaterals to the right coronary artery system.  1st diagonal (D1): Large diagonal branch which does not appear to have significant stenosis.  Circumflex (LCx):  There is a 95% ostial bifurcation stenosis.  1st obtuse marginal:  Large mid-vessel branch which has minor luminal irregularities.  2nd obtuse marginal:  Distal moderate sized branch without significant stenosis.  Ramus Intermedius:  Small ramus branch with severe ostial stenosis.  Right Coronary Artery: (Not engaged due to known mid-vessel occlusion)  Impression: 1.  Severe distal LM / ostial LAD/LCX bifurcation stenosis. 2.  Known occluded RCA with left to right collaterals, appears on old films to be a smaller, probably non-dominant vessel. 3.  LVEF 55-60%, 1+ MR, normal wall motion 4.  LVEDP = 20 mmHg  Plan: 1.  Mr. [name] has surgical disease with a preserved LVEF.  Would benefit most from  CABG. 2.  Family and patient have requested Dr. Cornel[name] - I will contact him for consultation. 3.  Bedrest for 5 hours to keep both legs straight. Return to 2900.  Keep on heparin and nitroglycerin.   The case and results was discussed with the patient and family if available.  The case and results was not discussed with the patient's PCP. The case and results was discussed with the patient's Cardiologist.  Time Spent Directly with the Patient:  60 minutes  Levi Nose, MD, [facility] Attending Cardiologist [name]  Gaylon Bentz C 10/19/2012, 10:42 AM

## 2012-10-19 NOTE — Consult Note (Signed)
CARDIOTHORACIC SURGERY CONSULTATION REPORT  PCP is Leanna Sato, MD Referring Provider is Chrystie Nose, MD   Reason for consultation:  Left main disease, 3-vessel coronary artery disease  HPI:  Patient is a 67 year old male with known history of coronary artery disease, hypertension, hyperlipidemia, and long-standing tobacco abuse. The patient underwent cardiac catheterization in 2004 with symptoms of stable angina and was found to have chronic occlusion of the right coronary artery with left-to-right collaterals and insignificant disease in the left coronary system. He was treated medically. He has done well until approximately 3 or 4 weeks ago when he first began to develop classical symptoms of exertional angina. At the time he was helping a friend frame a house and he began to experience some substernal chest pain radiating to the jaw that was usually promptly relieved by either rest or sublingual nitroglycerin. Over the last 3 weeks symptoms continued to accelerate until yesterday when the patient developed more severe episode of pain just while walking a short distance. The pain again was relieved by sublingual nitroglycerin.  He subsequently presented to the emergency department at [facility] where EKG was performed demonstrating subtle ST segment depression in the anterolateral leads. Troponin levels have remained negative. He underwent diagnostic cardiac catheterization earlier today by Dr. Rennis Golden demonstrating left main disease with severe three-vessel coronary artery disease.  Left ventricular function is fairly well preserved.  Cardiothoracic surgical consultation was requested.  Patient reports that he otherwise has been feeling well. He denies any problems with exertional shortness of breath, resting shortness of breath, PND, orthopnea, or lower extremity edema. He has not had palpitations no syncope.  Past Medical History  Diagnosis Date  . Coronary artery  disease   . Hypertension   . Dyslipidemia 10/18/2012    Past Surgical History  Procedure Laterality Date  . Cholecystectomy      No family history on file.  History   Social History  . Marital Status: Married    Spouse Name: N/A    Number of Children: N/A  . Years of Education: N/A   Occupational History  . Not on file.   Social History Main Topics  . Smoking status: Current Every Day Smoker  . Smokeless tobacco: Not on file  . Alcohol Use: No  . Drug Use: No  . Sexually Active: Not on file   Other Topics Concern  . Not on file   Social History Narrative  . No narrative on file    Prior to Admission medications   Medication Sig Start Date End Date Taking? Authorizing Provider  amLODipine (NORVASC) 5 MG tablet Take 5 mg by mouth daily.   Yes Historical Provider, MD  aspirin 81 MG chewable tablet Chew 81 mg by mouth daily.   Yes Historical Provider, MD  atorvastatin (LIPITOR) 20 MG tablet Take 20 mg by mouth daily.   Yes Historical Provider, MD  isosorbide mononitrate (IMDUR) 30 MG 24 hr tablet Take 30 mg by mouth daily.   Yes Historical Provider, MD  metoprolol (LOPRESSOR) 50 MG tablet Take 25 mg by mouth daily.   Yes Historical Provider, MD  nitroGLYCERIN (NITROSTAT) 0.4 MG SL tablet Place 0.4 mg under the tongue every 5 (five) minutes as needed for chest pain.   Yes Historical Provider, MD  quinapril (ACCUPRIL) 20 MG tablet Take 20 mg by mouth at bedtime.   Yes Historical Provider, MD    Current Facility-Administered Medications  Medication Dose Route Frequency  Provider Last Rate Last Dose  . 0.9 %  sodium chloride infusion  250 mL Intravenous PRN Chrystie Nose, MD      . acetaminophen (TYLENOL) tablet 650 mg  650 mg Oral Q4H PRN Wilburt Finlay, PA-C   650 mg at 10/19/12 1252  . [START ON 10/20/2012] aminocaproic acid (AMICAR) 10 g in sodium chloride 0.9 % 100 mL infusion   Intravenous To OR Runell Gess, MD      . amLODipine (NORVASC) tablet 5 mg  5 mg Oral  Daily Wilburt Finlay, PA-C   5 mg at 10/18/12 1755  . atorvastatin (LIPITOR) tablet 20 mg  20 mg Oral q1800 Wilburt Finlay, PA-C   20 mg at 10/18/12 1755  . [START ON 10/20/2012] cefUROXime (ZINACEF) 1.5 g in dextrose 5 % 50 mL IVPB  1.5 g Intravenous To OR Runell Gess, MD      . Melene [facility] ON 10/20/2012] cefUROXime (ZINACEF) 750 mg in dextrose 5 % 50 mL IVPB  750 mg Intravenous To OR Runell Gess, MD      . Melene [facility] ON 10/20/2012] dexmedetomidine (PRECEDEX) 400 MCG/100ML infusion  0.1-0.7 mcg/kg/hr Intravenous To OR Runell Gess, MD      . Melene [facility] ON 10/20/2012] DOPamine (INTROPIN) 800 mg in dextrose 5 % 250 mL infusion  2-20 mcg/kg/min Intravenous To OR Runell Gess, MD      . Melene [facility] ON 10/20/2012] EPINEPHrine (ADRENALIN) 4,000 mcg in dextrose 5 % 250 mL infusion  0.5-20 mcg/min Intravenous To OR Runell Gess, MD      . Melene [facility] ON 10/20/2012] heparin 2,500 Units, papaverine 30 mg in electrolyte-148 (PLASMALYTE-148) 500 mL irrigation   Irrigation To OR Runell Gess, MD      . heparin ADULT infusion 100 units/mL (25000 units/250 mL)  1,250 Units/hr Intravenous Continuous Purcell Nails, MD      . Melene [facility]er ON 10/20/2012] insulin regular (NOVOLIN R,HUMULIN R) 1 Units/mL in sodium chloride 0.9 % 100 mL infusion   Intravenous To OR Runell Gess, MD      . isosorbide mononitrate (IMDUR) 24 hr tablet 30 mg  30 mg Oral Daily Wilburt Finlay, PA-C   30 mg at 10/18/12 1755  . [START ON 10/20/2012] magnesium sulfate (IV Push/IM) injection 40 mEq  40 mEq Other To OR Runell Gess, MD      . metoprolol tartrate (LOPRESSOR) tablet 25 mg  25 mg Oral Daily Wilburt Finlay, PA-C   25 mg at 10/18/12 1757  . nitroGLYCERIN 0.2 mg/mL in dextrose 5 % infusion  5 mcg/min Intravenous Titrated Donnetta Hutching, MD 3 mL/hr at 10/19/12 0440 10 mcg/min at 10/19/12 0440  . [START ON 10/20/2012] nitroGLYCERIN 0.2 mg/mL in dextrose 5 % infusion  2-200 mcg/min Intravenous To OR Runell Gess, MD      . ondansetron [facility]) injection 4  mg  4 mg Intravenous Q6H PRN Wilburt Finlay, PA-C      . oxyCODONE-acetaminophen (PERCOCET/ROXICET) 5-325 MG per tablet 1-2 tablet  1-2 tablet Oral Q4H PRN Chrystie Nose, MD      . Melene Muller ON 10/20/2012] phenylephrine (NEO-SYNEPHRINE) 20,000 mcg in dextrose 5 % 250 mL infusion  30-200 mcg/min Intravenous To OR Runell Gess, MD      . Melene Muller ON 10/20/2012] potassium chloride injection 80 mEq  80 mEq Other To OR Runell Gess, MD      . quinapril (ACCUPRIL) tablet 20 mg  20 mg Oral QHS Wilburt Finlay, PA-C  20 mg at 10/18/12 2140  . sodium chloride 0.9 % injection 3 mL  3 mL Intravenous Q12H Chrystie Nose, MD      . sodium chloride 0.9 % injection 3 mL  3 mL Intravenous PRN Chrystie Nose, MD      . Melene Muller ON 10/20/2012] vancomycin (VANCOCIN) 1,000 mg in sodium chloride 0.9 % 1,000 mL irrigation   Irrigation To OR Runell Gess, MD      . Melene Muller ON 10/20/2012] vancomycin (VANCOCIN) 1,250 mg in sodium chloride 0.9 % 250 mL IVPB  1,250 mg Intravenous To OR Runell Gess, MD        No Known Allergies    Review of Systems:   General:  normal appetite, normal energy, no weight gain, no weight loss, no fever  Cardiac:  + chest pain with exertion, + chest pain at rest, no SOB with exertion, no resting SOB, no PND, no orthopnea, no palpitations, no arrhythmia, no atrial fibrillation, no LE edema, no dizzy spells, no syncope  Respiratory:  no shortness of breath, no home oxygen, no productive cough, + occasional dry cough, no bronchitis, no wheezing, no hemoptysis, no asthma, no pain with inspiration or cough, no sleep apnea, no CPAP at night  GI:   no difficulty swallowing, no reflux, no frequent heartburn, no hiatal hernia, no abdominal pain, no constipation, no diarrhea, no hematochezia, no hematemesis, no melena  GU:   no dysuria,  no frequency, no urinary tract infection, no hematuria, no enlarged prostate, no kidney stones, no kidney disease  Vascular:  no pain suggestive of claudication,  no pain in feet, no leg cramps, + varicose veins, no DVT, no non-healing foot ulcer  Neuro:   no stroke, no TIA's, no seizures, no headaches, no temporary blindness one eye,  no slurred speech, no peripheral neuropathy, no chronic pain, no instability of gait, no memory/cognitive dysfunction  Musculoskeletal: no arthritis, no joint swelling, no myalgias, no difficulty walking, normal mobility   Skin:   no rash, no itching, no skin infections, no pressure sores or ulcerations  Psych:   no anxiety, no depression, no nervousness, no unusual recent stress  Eyes:   no blurry vision, no floaters, no recent vision changes, + wears glasses or contacts  ENT:   no hearing loss, no loose or painful teeth, no dentures  Hematologic:  no easy bruising, no abnormal bleeding, no clotting disorder, no frequent epistaxis  Endocrine:  no diabetes, does not check CBG's at home     Physical Exam:   BP 149/73  Pulse 65  Temp(Src) 97.6 F (36.4 C) (Oral)  Resp 21  Ht 5\' 7"  (1.702 m)  Wt 94.8 kg (208 lb 15.9 oz)  BMI 32.73 kg/m2  SpO2 92%  General:  Mildly obese,  well-appearing  HEENT:  Unremarkable   Neck:   no JVD, no bruits, no adenopathy   Chest:   clear to auscultation, symmetrical breath sounds, no wheezes, no rhonchi   CV:   RRR, no  murmur   Abdomen:  soft, non-tender, no masses   Extremities:  warm, well-perfused, pulses palpable, mild-moderate varicosities both lower legs  Rectal/GU  Deferred  Neuro:   Grossly non-focal and symmetrical throughout  Skin:   Clean and dry, no rashes, no breakdown  Diagnostic Tests:  CARDIAC CATHETERIZATION REPORT  [name] [id number]  11-15-1945  Performing Cardiologist: Chrystie Nose  Primary Physician: Leanna Sato, MD  Primary Cardiologist: [place]  Procedures Performed:  Left Heart Catheterization  via 5 Fr right femoral artery access  Left Ventriculography, (RAO/LAO) 15 ml/sec for 30 ml total contrast  Native Coronary Angiography  Internal  Mammary Graft Angiography  Saphenous Vein Graft / Free Radial Artery Graft Angiography Indication(s): unstable angina  Pre-Procedural Non-invasive testing: known CAD with occluded RCA and left to right collaterals  History: 67 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 He has a history of CAD, HTN, dyslipidemia, varicose veins with LEE and kidney stones. He had a coronary angiogram in 2004 which showed an occluded RCA with left to right collaterals. He also had a 60% lesion in the mid circumflex and 40% in the OM. He presented to [facility] with chest pain. He reports progressive anginal symptoms for the last 3-4 weeks which are responsive to NTG. He reports radiation to neck and jaw. He denies N, V, fever, diaphoresis, dizziness, orthopnea, PND, cough congestion, Abd pain, hematuria, hematochezia, melena. EKG wit subtle ST depression in V5-6.  Consent: The procedure with Risks/Benefits/Alternatives and Indications were reviewed with the patient (and family). All questions were answered.  Risks / Complications include, but not limited to: Death, MI, CVA/TIA, VF/VT (with defibrillation), Bradycardia (need for temporary pacer placement), contrast induced nephropathy, bleeding / bruising / hematoma / pseudoaneurysm, vascular or coronary injury (with possible emergent CT or Vascular Surgery), adverse medication reactions, infection.  Consent:  Risks of procedure as well as the alternatives and risks of each were explained to the (patient/caregiver). Consent for procedure obtained.  Procedure: The patient was brought to the 2nd Floor [facility] in the fasting state and prepped and draped in the usual sterile fashion for (Left groin access).  Time Out: Verified patient identification, verified procedure, site/side was marked, verified correct patient position, special equipment/implants available, radiation safety measures in place (including badges and shielding),  medications/allergies/relevent history reviewed, required imaging and test results available. Performed  Procedure:  The right femoral head was identified using tactile and fluoroscopic technique. The right groin was anesthetized with 1% subcutaneous Lidocaine. The right Common Femoral Artery was accessed using the Modified Seldinger Technique. The wire was advanced however it was noted to have some resistance. Fluoroscopy demonstrated the wire was tortuous with a 90 degree bend that blocked advancement of the dilator and sheath. The wire was then withdrawn and direct pressure was held on the right femoral artery for 10 minutes. Once hemostasis was achieved, the left groin was prepped and draped in the usual sterile fashion. Using the same described technique, the vessel was easily accessed and placement of (5 Fr) sheath was performed using the Seldinger technique. The sheath was aspirated and flushed. A 5 Fr JL4 Catheter was advanced of over a Standard J wire into the ascending Aorta. The catheter was used to engage the left coronary artery. Multiple cineangiographic views of the left coronary artery system(s) were performed. The right coronary artery was not engaged as it is known to be occluded. This catheter was then exchanged over the Standard J wire for an angled Pigtail catheter that was advanced across the Aortic Valve. LV hemodynamics were measured (and Left Ventriculography was performed). LV hemodynamics were then re-sampled, and the catheter was pulled back across the Aortic Valve for measurement of "pull-back" gradient. The catheter and the wire was removed completely out of the body. The patient was transferred to the holding area where the sheath was removed with manual pressure held for hemostasis.  Recovery:  The patient was transported to the  cath lab holding area in stable condition.  The patient was stable before, during and following the procedure.  Patient did tolerate procedure well.    There were not complications.  EBL: Minimal  Medications:  Premedication: None  Sedation: 1 mg IV Versed, 50 mcg IV Fentanyl  Contrast: 100 ml Omnipaque  5 cc 1% lidocaine  Hemodynamics:  Central Aortic Pressure / Mean Aortic Pressure: 130/71  LV Pressure / LV End diastolic Pressure: 20  Left Ventriculography:  EF: 55-60%  Wall Motion: Normal wall motion, 1+ MR  Coronary Angiographic Data:  Left Main: Large caliber 4.75mm vessel, tapers distally to >90% stenosis.  Left Anterior Descending (LAD): There is 95% ostial stenosis of the LAD which reaches the apex. There is diffuse mild stenosis of the LAD. There are 3 large septal perforator branches which give off collaterals to the right coronary artery system.  1st diagonal (D1): Large diagonal branch which does not appear to have significant stenosis.  Circumflex (LCx): There is a 95% ostial bifurcation stenosis.  1st obtuse marginal: Large mid-vessel branch which has minor luminal irregularities.  2nd obtuse marginal: Distal moderate sized branch without significant stenosis.  Ramus Intermedius: Small ramus branch with severe ostial stenosis.  Right Coronary Artery: (Not engaged due to known mid-vessel occlusion) Impression:  1. Severe distal LM / ostial LAD/LCX bifurcation stenosis.  2. Known occluded RCA with left to right collaterals, appears on old films to be a smaller, probably non-dominant vessel.  3. LVEF 55-60%, 1+ MR, normal wall motion  4. LVEDP = 20 mmHg  Plan:  1. [name] has surgical disease with a preserved LVEF. Would benefit most from CABG.  2. Family and patient have requested Dr. Cornelius Moras - I will contact him for consultation.  3. Bedrest for 5 hours to keep both legs straight. Return to 2900. Keep on heparin and nitroglycerin.  The case and results was discussed with the patient and family if available.  The case and results was not discussed with the patient's PCP.  The case and results was discussed with the  patient's Cardiologist.  Time Spent Directly with the Patient:  60 minutes  Chrystie Nose, MD, [facility]  Attending Cardiologist  [facility]  HILTY,Kenneth C  10/19/2012, 10:42 AM       Impression:  Left main disease with high grade ostial left anterior descending coronary artery and left circumflex coronary artery stenosis and chronic occlusion of the right coronary artery. The patient presents with acute coronary syndrome. Left ventricular function is reasonably well preserved. I agree that prompt surgical revascularization is the best course of treatment.    Plan:  I reviewed the indications, risks, and potential benefits of surgery at length with the patient and his family this evening. Alternative treatment strategies been discussed. They understand and accept all potential associated risks of surgery including but not limited to risk of death, stroke, myocardial infarction, congestive heart failure, respiratory failure, renal failure, bleeding requiring blood transfusion, arrhythmia, infection, and late recurrence of symptomatic coronary artery disease. We have discussed the need for smoking cessation. All their questions been addressed. We plan to proceed with surgery first thing tomorrow morning.    Salvatore Decent. Cornelius Moras, MD 10/19/2012 6:26 PM  I spent in excess of 60 minutes of time directly involved in the conduct of this consultation.

## 2012-10-19 NOTE — H&P (Signed)
     [facility]          INTERVAL PROCEDURE H&P   History and Physical Interval Note:  10/19/2012 8:03 AM  [name] has presented today for their planned procedure. The various methods of treatment have been discussed with the patient and family. After consideration of risks, benefits and other options for treatment, the patient has consented to the procedure.  The patients' outpatient history has been reviewed, patient examined, and no change in status from most recent office note within the past 30 days. I have reviewed the patients' chart and labs and will proceed as planned. Questions were answered to the patient's satisfaction.   Chrystie Nose, MD, [facility] Attending Cardiologist [facility]  [name] C 10/19/2012, 8:03 AM

## 2012-10-20 ENCOUNTER — Encounter (HOSPITAL_COMMUNITY)
Admission: EM | Disposition: A | Discharge status: Home / Self Care | Payer: Self-pay | Source: Home / Self Care | Attending: Thoracic Surgery (Cardiothoracic Vascular Surgery)

## 2012-10-20 ENCOUNTER — Encounter (HOSPITAL_COMMUNITY): Payer: Self-pay | Admitting: Certified Registered Nurse Anesthetist

## 2012-10-20 ENCOUNTER — Inpatient Hospital Stay (HOSPITAL_COMMUNITY): Payer: Medicare Other

## 2012-10-20 ENCOUNTER — Inpatient Hospital Stay (HOSPITAL_COMMUNITY): Payer: Medicare Other | Admitting: Anesthesiology

## 2012-10-20 ENCOUNTER — Encounter (HOSPITAL_COMMUNITY): Payer: Self-pay | Admitting: Anesthesiology

## 2012-10-20 DIAGNOSIS — I2510 Atherosclerotic heart disease of native coronary artery without angina pectoris: Secondary | ICD-10-CM

## 2012-10-20 DIAGNOSIS — Z951 Presence of aortocoronary bypass graft: Secondary | ICD-10-CM

## 2012-10-20 DIAGNOSIS — J449 Chronic obstructive pulmonary disease, unspecified: Secondary | ICD-10-CM

## 2012-10-20 HISTORY — PX: INTRAOPERATIVE TRANSESOPHAGEAL ECHOCARDIOGRAM: SHX5062

## 2012-10-20 HISTORY — DX: Chronic obstructive pulmonary disease, unspecified: J44.9

## 2012-10-20 HISTORY — DX: Presence of aortocoronary bypass graft: Z95.1

## 2012-10-20 HISTORY — PX: CORONARY ARTERY BYPASS GRAFT: SHX141

## 2012-10-20 LAB — POCT I-STAT 3, ART BLOOD GAS (G3+)
Acid-Base Excess: 2 mmol/L (ref 0.0–2.0)
Acid-base deficit: 3 mmol/L — ABNORMAL HIGH (ref 0.0–2.0)
Acid-base deficit: 3 mmol/L — ABNORMAL HIGH (ref 0.0–2.0)
Bicarbonate: 27 mEq/L — ABNORMAL HIGH (ref 20.0–24.0)
Bicarbonate: 25.7 mEq/L — ABNORMAL HIGH (ref 20.0–24.0)
O2 Saturation: 100 %
Patient temperature: 36.5
TCO2: 28 mmol/L (ref 0–100)
pCO2 arterial: 59.5 mmHg (ref 35.0–45.0)
pCO2 arterial: 47.2 mmHg — ABNORMAL HIGH (ref 35.0–45.0)
pO2, Arterial: 400 mmHg — ABNORMAL HIGH (ref 80.0–100.0)
pO2, Arterial: 444 mmHg — ABNORMAL HIGH (ref 80.0–100.0)
pO2, Arterial: 70 mmHg — ABNORMAL LOW (ref 80.0–100.0)
pO2, Arterial: 66 mmHg — ABNORMAL LOW (ref 80.0–100.0)
pO2, Arterial: 76 mmHg — ABNORMAL LOW (ref 80.0–100.0)

## 2012-10-20 LAB — BLOOD GAS, ARTERIAL
Drawn by: 331761
FIO2: 0.36 %
Patient temperature: 98
TCO2: 29.9 mmol/L (ref 0–100)
pCO2 arterial: 54 mmHg — ABNORMAL HIGH (ref 35.0–45.0)
pH, Arterial: 7.336 — ABNORMAL LOW (ref 7.350–7.450)

## 2012-10-20 LAB — BASIC METABOLIC PANEL
BUN: 8 mg/dL (ref 6–23)
CO2: 31 mEq/L (ref 19–32)
Chloride: 103 mEq/L (ref 96–112)
GFR calc non Af Amer: >90 mL/min (ref >90)
Glucose, Bld: 99 mg/dL (ref 70–99)
Potassium: 3.9 mEq/L (ref 3.5–5.1)
Sodium: 137 mEq/L (ref 135–145)

## 2012-10-20 LAB — CBC
HCT: 43.1 % (ref 39.0–52.0)
Hemoglobin: 13.1 g/dL (ref 13.0–17.0)
MCH: 31.5 pg (ref 26.0–34.0)
MCHC: 34.1 g/dL (ref 30.0–36.0)
MCV: 88.7 fL (ref 78.0–100.0)
Platelets: 206 10*3/uL (ref 150–400)
Platelets: 127 10*3/uL — ABNORMAL LOW (ref 150–400)
RBC: 4.19 MIL/uL — ABNORMAL LOW (ref 4.22–5.81)
RDW: 12.2 % (ref 11.5–15.5)
RDW: 12.1 % (ref 11.5–15.5)
WBC: 9.1 10*3/uL (ref 4.0–10.5)
WBC: 17.9 10*3/uL — ABNORMAL HIGH (ref 4.0–10.5)
WBC: 15.5 10*3/uL — ABNORMAL HIGH (ref 4.0–10.5)

## 2012-10-20 LAB — APTT: aPTT: 29 seconds (ref 24–37)

## 2012-10-20 LAB — POCT I-STAT 4, (NA,K, GLUC, HGB,HCT)
Glucose, Bld: 101 mg/dL — ABNORMAL HIGH (ref 70–99)
Glucose, Bld: 114 mg/dL — ABNORMAL HIGH (ref 70–99)
HCT: 41 % (ref 39.0–52.0)
HCT: 36 % — ABNORMAL LOW (ref 39.0–52.0)
HCT: 33 % — ABNORMAL LOW (ref 39.0–52.0)
HCT: 38 % — ABNORMAL LOW (ref 39.0–52.0)
Hemoglobin: 13.9 g/dL (ref 13.0–17.0)
Hemoglobin: 12.2 g/dL — ABNORMAL LOW (ref 13.0–17.0)
Hemoglobin: 11.2 g/dL — ABNORMAL LOW (ref 13.0–17.0)
Hemoglobin: 12.9 g/dL — ABNORMAL LOW (ref 13.0–17.0)
Potassium: 3.9 mEq/L (ref 3.5–5.1)
Potassium: 4.3 mEq/L (ref 3.5–5.1)
Sodium: 139 mEq/L (ref 135–145)
Sodium: 138 mEq/L (ref 135–145)

## 2012-10-20 LAB — POCT I-STAT, CHEM 8
Calcium, Ion: 1.21 mmol/L (ref 1.13–1.30)
HCT: 37 % — ABNORMAL LOW (ref 39.0–52.0)
Hemoglobin: 12.6 g/dL — ABNORMAL LOW (ref 13.0–17.0)
TCO2: 25 mmol/L (ref 0–100)

## 2012-10-20 LAB — CREATININE, SERUM
Creatinine, Ser: 0.73 mg/dL (ref 0.50–1.35)
GFR calc Af Amer: >90 mL/min (ref >90)
GFR calc non Af Amer: >90 mL/min (ref >90)

## 2012-10-20 LAB — HEMOGLOBIN AND HEMATOCRIT, BLOOD: Hemoglobin: 11.2 g/dL — ABNORMAL LOW (ref 13.0–17.0)

## 2012-10-20 LAB — ABO/RH: ABO/RH(D): A POS

## 2012-10-20 LAB — PROTIME-INR: Prothrombin Time: 15.9 seconds — ABNORMAL HIGH (ref 11.6–15.2)

## 2012-10-20 LAB — MAGNESIUM: Magnesium: 3.1 mg/dL — ABNORMAL HIGH (ref 1.5–2.5)

## 2012-10-20 SURGERY — CORONARY ARTERY BYPASS GRAFTING (CABG)
Anesthesia: General | Site: Chest | Wound class: Clean

## 2012-10-20 MED ORDER — METOPROLOL TARTRATE 25 MG/10 ML ORAL SUSPENSION
12.5000 mg | Freq: Two times a day (BID) | ORAL | Status: DC
  Filled 2012-10-20 (×3): qty 5

## 2012-10-20 MED ORDER — METOPROLOL TARTRATE 12.5 MG HALF TABLET
12.5000 mg | ORAL_TABLET | Freq: Two times a day (BID) | ORAL | Status: DC
  Administered 2012-10-21 – 2012-10-23 (×6): 12.5 mg via ORAL
  Filled 2012-10-20 (×9): qty 1

## 2012-10-20 MED ORDER — IPRATROPIUM BROMIDE 0.02 % IN SOLN: 0.5000 mg | Freq: Four times a day (QID) | RESPIRATORY_TRACT | Status: DC | PRN

## 2012-10-20 MED ORDER — LACTATED RINGERS IV SOLN
INTRAVENOUS | Status: DC | PRN
  Administered 2012-10-20: 07:00:00 via INTRAVENOUS

## 2012-10-20 MED ORDER — FAMOTIDINE IN NACL 20-0.9 MG/50ML-% IV SOLN
20.0000 mg | Freq: Two times a day (BID) | INTRAVENOUS | Status: AC
  Administered 2012-10-20 (×2): 20 mg via INTRAVENOUS
  Filled 2012-10-20: qty 50

## 2012-10-20 MED ORDER — BISACODYL 10 MG RE SUPP: 10.0000 mg | Freq: Every day | RECTAL | Status: DC

## 2012-10-20 MED ORDER — LACTATED RINGERS IV SOLN
INTRAVENOUS | Status: DC | PRN
  Administered 2012-10-20 (×3): via INTRAVENOUS

## 2012-10-20 MED ORDER — EPHEDRINE SULFATE 50 MG/ML IJ SOLN
INTRAMUSCULAR | Status: DC | PRN
  Administered 2012-10-20: 10 mg via INTRAVENOUS

## 2012-10-20 MED ORDER — 0.9 % SODIUM CHLORIDE (POUR BTL) OPTIME
TOPICAL | Status: DC | PRN
  Administered 2012-10-20: 6000 mL

## 2012-10-20 MED ORDER — VANCOMYCIN HCL IN DEXTROSE 1-5 GM/200ML-% IV SOLN
1000.0000 mg | Freq: Once | INTRAVENOUS | Status: AC
  Administered 2012-10-20: 1000 mg via INTRAVENOUS
  Filled 2012-10-20: qty 200

## 2012-10-20 MED ORDER — ASPIRIN 81 MG PO CHEW: 324.0000 mg | CHEWABLE_TABLET | Freq: Every day | ORAL | Status: DC

## 2012-10-20 MED ORDER — ONDANSETRON HCL 4 MG/2ML IJ SOLN: 4.0000 mg | Freq: Four times a day (QID) | INTRAMUSCULAR | Status: DC | PRN

## 2012-10-20 MED ORDER — PROPOFOL 10 MG/ML IV BOLUS
INTRAVENOUS | Status: DC | PRN
  Administered 2012-10-20: 40 mg via INTRAVENOUS
  Administered 2012-10-20: 50 mg via INTRAVENOUS
  Administered 2012-10-20: 100 mg via INTRAVENOUS

## 2012-10-20 MED ORDER — ASPIRIN EC 325 MG PO TBEC
325.0000 mg | DELAYED_RELEASE_TABLET | Freq: Every day | ORAL | Status: DC
  Administered 2012-10-21 – 2012-10-25 (×5): 325 mg via ORAL
  Filled 2012-10-20 (×5): qty 1

## 2012-10-20 MED ORDER — DOCUSATE SODIUM 100 MG PO CAPS
200.0000 mg | ORAL_CAPSULE | Freq: Every day | ORAL | Status: DC
  Administered 2012-10-21 – 2012-10-25 (×5): 200 mg via ORAL
  Filled 2012-10-20 (×5): qty 2

## 2012-10-20 MED ORDER — MICROFIBRILLAR COLL HEMOSTAT EX PADS
MEDICATED_PAD | CUTANEOUS | Status: DC | PRN
  Administered 2012-10-20: 1 via TOPICAL

## 2012-10-20 MED ORDER — POTASSIUM CHLORIDE 10 MEQ/50ML IV SOLN
10.0000 meq | INTRAVENOUS | Status: AC
  Administered 2012-10-20 (×3): 10 meq via INTRAVENOUS

## 2012-10-20 MED ORDER — ALBUMIN HUMAN 5 % IV SOLN
250.0000 mL | INTRAVENOUS | Status: AC | PRN
  Administered 2012-10-20 (×2): 250 mL via INTRAVENOUS

## 2012-10-20 MED ORDER — MAGNESIUM SULFATE 40 MG/ML IJ SOLN
4.0000 g | Freq: Once | INTRAMUSCULAR | Status: AC
  Administered 2012-10-20: 4 g via INTRAVENOUS
  Filled 2012-10-20: qty 100

## 2012-10-20 MED ORDER — NITROGLYCERIN IN D5W 200-5 MCG/ML-% IV SOLN: 0.0000 ug/min | INTRAVENOUS | Status: DC

## 2012-10-20 MED ORDER — ACETAMINOPHEN 160 MG/5ML PO SOLN
975.0000 mg | Freq: Four times a day (QID) | ORAL | Status: DC
  Administered 2012-10-21 (×2): 975 mg
  Filled 2012-10-20 (×2): qty 40.6

## 2012-10-20 MED ORDER — CALCIUM CHLORIDE 10 % IV SOLN
1.0000 g | Freq: Once | INTRAVENOUS | Status: AC | PRN
  Filled 2012-10-20: qty 10

## 2012-10-20 MED ORDER — HEPARIN SODIUM (PORCINE) 1000 UNIT/ML IJ SOLN
INTRAMUSCULAR | Status: DC | PRN
  Administered 2012-10-20: 27000 [IU] via INTRAVENOUS

## 2012-10-20 MED ORDER — SODIUM CHLORIDE 0.9 % IV SOLN
INTRAVENOUS | Status: AC
  Administered 2012-10-21: 2 [IU]/h via INTRAVENOUS
  Filled 2012-10-20: qty 1

## 2012-10-20 MED ORDER — LACTATED RINGERS IV SOLN
INTRAVENOUS | Status: DC | PRN
  Administered 2012-10-20 (×2): via INTRAVENOUS

## 2012-10-20 MED ORDER — THROMBIN 20000 UNITS EX SOLR
OROMUCOSAL | Status: DC | PRN
  Administered 2012-10-20: 10:00:00 via TOPICAL

## 2012-10-20 MED ORDER — BISACODYL 5 MG PO TBEC
10.0000 mg | DELAYED_RELEASE_TABLET | Freq: Every day | ORAL | Status: DC
  Administered 2012-10-21 – 2012-10-25 (×5): 10 mg via ORAL
  Filled 2012-10-20 (×5): qty 2

## 2012-10-20 MED ORDER — PROTAMINE SULFATE 10 MG/ML IV SOLN
INTRAVENOUS | Status: DC | PRN
  Administered 2012-10-20 (×5): 50 mg via INTRAVENOUS
  Administered 2012-10-20: 30 mg via INTRAVENOUS

## 2012-10-20 MED ORDER — SODIUM CHLORIDE 0.9 % IJ SOLN: 3.0000 mL | Freq: Two times a day (BID) | INTRAMUSCULAR | Status: DC

## 2012-10-20 MED ORDER — PANTOPRAZOLE SODIUM 40 MG PO TBEC
40.0000 mg | DELAYED_RELEASE_TABLET | Freq: Every day | ORAL | Status: DC
  Administered 2012-10-22 – 2012-10-25 (×4): 40 mg via ORAL
  Filled 2012-10-20 (×4): qty 1

## 2012-10-20 MED ORDER — VECURONIUM BROMIDE 10 MG IV SOLR
INTRAVENOUS | Status: DC | PRN
  Administered 2012-10-20: 5 mg via INTRAVENOUS
  Administered 2012-10-20: 10 mg via INTRAVENOUS
  Administered 2012-10-20: 5 mg via INTRAVENOUS

## 2012-10-20 MED ORDER — ALBUMIN HUMAN 5 % IV SOLN
INTRAVENOUS | Status: DC | PRN
  Administered 2012-10-20: 12:00:00 via INTRAVENOUS

## 2012-10-20 MED ORDER — MORPHINE SULFATE 2 MG/ML IJ SOLN
1.0000 mg | INTRAMUSCULAR | Status: AC | PRN
  Administered 2012-10-20 (×2): 4 mg via INTRAVENOUS
  Filled 2012-10-20 (×4): qty 2

## 2012-10-20 MED ORDER — LACTATED RINGERS IV SOLN: INTRAVENOUS | Status: DC

## 2012-10-20 MED ORDER — OXYCODONE HCL 5 MG PO TABS
5.0000 mg | ORAL_TABLET | ORAL | Status: DC | PRN
  Administered 2012-10-21 – 2012-10-22 (×7): 5 mg via ORAL
  Administered 2012-10-22: 10 mg via ORAL
  Administered 2012-10-22 – 2012-10-25 (×3): 5 mg via ORAL
  Filled 2012-10-20: qty 1
  Filled 2012-10-20: qty 2
  Filled 2012-10-20: qty 1
  Filled 2012-10-20: qty 2
  Filled 2012-10-20 (×7): qty 1

## 2012-10-20 MED ORDER — ROCURONIUM BROMIDE 100 MG/10ML IV SOLN
INTRAVENOUS | Status: DC | PRN
  Administered 2012-10-20: 50 mg via INTRAVENOUS
  Administered 2012-10-20: 30 mg via INTRAVENOUS
  Administered 2012-10-20: 50 mg via INTRAVENOUS

## 2012-10-20 MED ORDER — LIDOCAINE HCL (CARDIAC) 20 MG/ML IV SOLN
INTRAVENOUS | Status: DC | PRN
  Administered 2012-10-20: 100 mg via INTRAVENOUS

## 2012-10-20 MED ORDER — ACETAMINOPHEN 10 MG/ML IV SOLN
1000.0000 mg | Freq: Once | INTRAVENOUS | Status: AC
  Administered 2012-10-20: 1000 mg via INTRAVENOUS
  Filled 2012-10-20: qty 100

## 2012-10-20 MED ORDER — MORPHINE SULFATE 2 MG/ML IJ SOLN
2.0000 mg | INTRAMUSCULAR | Status: DC | PRN
  Administered 2012-10-21 (×4): 4 mg via INTRAVENOUS
  Filled 2012-10-20 (×2): qty 2

## 2012-10-20 MED ORDER — ACETAMINOPHEN 500 MG PO TABS
1000.0000 mg | ORAL_TABLET | Freq: Four times a day (QID) | ORAL | Status: DC
  Administered 2012-10-21 – 2012-10-25 (×16): 1000 mg via ORAL
  Filled 2012-10-20 (×19): qty 2

## 2012-10-20 MED ORDER — INSULIN REGULAR BOLUS VIA INFUSION
0.0000 [IU] | Freq: Three times a day (TID) | INTRAVENOUS | Status: AC
  Filled 2012-10-20: qty 10

## 2012-10-20 MED ORDER — DEXMEDETOMIDINE HCL IN NACL 200 MCG/50ML IV SOLN
0.1000 ug/kg/h | INTRAVENOUS | Status: DC
  Filled 2012-10-20: qty 50

## 2012-10-20 MED ORDER — MIDAZOLAM HCL 2 MG/2ML IJ SOLN: 2.0000 mg | INTRAMUSCULAR | Status: DC | PRN

## 2012-10-20 MED ORDER — SODIUM CHLORIDE 0.45 % IV SOLN
INTRAVENOUS | Status: DC
  Administered 2012-10-20 – 2012-10-21 (×2): via INTRAVENOUS

## 2012-10-20 MED ORDER — METOPROLOL TARTRATE 1 MG/ML IV SOLN: 2.5000 mg | INTRAVENOUS | Status: DC | PRN

## 2012-10-20 MED ORDER — DEXTROSE 5 % IV SOLN
1.5000 g | Freq: Two times a day (BID) | INTRAVENOUS | Status: AC
  Administered 2012-10-20 – 2012-10-22 (×4): 1.5 g via INTRAVENOUS
  Filled 2012-10-20 (×4): qty 1.5

## 2012-10-20 MED ORDER — AMINOCAPROIC ACID 250 MG/ML IV SOLN
INTRAVENOUS | Status: DC | PRN
  Administered 2012-10-20: 5 g via INTRAVENOUS

## 2012-10-20 MED ORDER — FENTANYL CITRATE 0.05 MG/ML IJ SOLN
INTRAMUSCULAR | Status: DC | PRN
  Administered 2012-10-20: 100 ug via INTRAVENOUS
  Administered 2012-10-20: 50 ug via INTRAVENOUS
  Administered 2012-10-20: 250 ug via INTRAVENOUS
  Administered 2012-10-20: 100 ug via INTRAVENOUS
  Administered 2012-10-20: 150 ug via INTRAVENOUS
  Administered 2012-10-20: 100 ug via INTRAVENOUS
  Administered 2012-10-20: 250 ug via INTRAVENOUS
  Administered 2012-10-20 (×2): 50 ug via INTRAVENOUS
  Administered 2012-10-20 (×2): 100 ug via INTRAVENOUS
  Administered 2012-10-20: 250 ug via INTRAVENOUS
  Administered 2012-10-20 (×2): 100 ug via INTRAVENOUS

## 2012-10-20 MED ORDER — PHENYLEPHRINE HCL 10 MG/ML IJ SOLN
0.0000 ug/min | INTRAVENOUS | Status: DC
  Filled 2012-10-20: qty 2

## 2012-10-20 MED ORDER — SODIUM CHLORIDE 0.9 % IV SOLN: INTRAVENOUS | Status: DC

## 2012-10-20 MED ORDER — SODIUM CHLORIDE 0.9 % IJ SOLN
3.0000 mL | INTRAMUSCULAR | Status: DC | PRN
  Administered 2012-10-21: 3 mL via INTRAVENOUS

## 2012-10-20 MED ORDER — MIDAZOLAM HCL 5 MG/5ML IJ SOLN
INTRAMUSCULAR | Status: DC | PRN
  Administered 2012-10-20 (×2): 5 mg via INTRAVENOUS
  Administered 2012-10-20: 2 mg via INTRAVENOUS
  Administered 2012-10-20: 3 mg via INTRAVENOUS

## 2012-10-20 MED ORDER — SODIUM CHLORIDE 0.9 % IV SOLN: 250.0000 mL | INTRAVENOUS | Status: DC

## 2012-10-20 MED ORDER — ALBUTEROL SULFATE (5 MG/ML) 0.5% IN NEBU
2.5000 mg | INHALATION_SOLUTION | RESPIRATORY_TRACT | Status: DC | PRN
  Administered 2012-10-20 – 2012-10-21 (×2): 2.5 mg via RESPIRATORY_TRACT
  Filled 2012-10-20 (×2): qty 0.5

## 2012-10-20 MED ORDER — LACTATED RINGERS IV SOLN
INTRAVENOUS | Status: DC | PRN
  Administered 2012-10-20: 09:00:00 via INTRAVENOUS

## 2012-10-20 SURGICAL SUPPLY — 118 items
ADAPTER CARDIO PERF ANTE/RETRO (ADAPTER) IMPLANT
APPLIER CLIP 9.375 MED OPEN (MISCELLANEOUS)
APPLIER CLIP 9.375 SM OPEN (CLIP)
ATTRACTOMAT 16X20 MAGNETIC DRP (DRAPES) ×3 IMPLANT
BAG DECANTER FOR FLEXI CONT (MISCELLANEOUS) ×3 IMPLANT
BANDAGE ELASTIC 4 VELCRO ST LF (GAUZE/BANDAGES/DRESSINGS) ×3 IMPLANT
BANDAGE ELASTIC 6 VELCRO ST LF (GAUZE/BANDAGES/DRESSINGS) ×3 IMPLANT
BANDAGE GAUZE ELAST BULKY 4 IN (GAUZE/BANDAGES/DRESSINGS) ×3 IMPLANT
BASKET HEART (ORDER IN 25'S) (MISCELLANEOUS) ×1
BASKET HEART (ORDER IN 25S) (MISCELLANEOUS) ×2 IMPLANT
BENZOIN TINCTURE PRP APPL 2/3 (GAUZE/BANDAGES/DRESSINGS) ×3 IMPLANT
BLADE STERNUM SYSTEM 6 (BLADE) ×3 IMPLANT
BLADE SURG 11 STRL SS (BLADE) ×3 IMPLANT
BLADE SURG ROTATE 9660 (MISCELLANEOUS) IMPLANT
CANISTER SUCTION 2500CC (MISCELLANEOUS) ×3 IMPLANT
CANNULA GUNDRY RCSP 15FR (MISCELLANEOUS) IMPLANT
CATH CPB KIT OWEN (MISCELLANEOUS) ×3 IMPLANT
CATH THORACIC 28FR (CATHETERS) IMPLANT
CATH THORACIC 28FR RT ANG (CATHETERS) IMPLANT
CATH THORACIC 36FR (CATHETERS) ×3 IMPLANT
CATH THORACIC 36FR RT ANG (CATHETERS) ×3 IMPLANT
CLIP APPLIE 9.375 MED OPEN (MISCELLANEOUS) IMPLANT
CLIP APPLIE 9.375 SM OPEN (CLIP) IMPLANT
CLIP FOGARTY SPRING 6M (CLIP) IMPLANT
CLIP TI MEDIUM 24 (CLIP) IMPLANT
CLIP TI WIDE RED SMALL 24 (CLIP) IMPLANT
CLOTH BEACON ORANGE TIMEOUT ST (SAFETY) ×3 IMPLANT
CLSR STERI-STRIP ANTIMIC 1/2X4 (GAUZE/BANDAGES/DRESSINGS) ×3 IMPLANT
CONN Y 3/8X3/8X3/8  BEN (MISCELLANEOUS)
CONN Y 3/8X3/8X3/8 BEN (MISCELLANEOUS) IMPLANT
COVER SURGICAL LIGHT HANDLE (MISCELLANEOUS) ×3 IMPLANT
CRADLE DONUT ADULT HEAD (MISCELLANEOUS) ×3 IMPLANT
DRAIN CHANNEL 32F RND 10.7 FF (WOUND CARE) ×3 IMPLANT
DRAPE CARDIOVASCULAR INCISE (DRAPES) ×1
DRAPE INCISE IOBAN 66X45 STRL (DRAPES) ×3 IMPLANT
DRAPE SLUSH/WARMER DISC (DRAPES) ×3 IMPLANT
DRAPE SRG 135X102X78XABS (DRAPES) ×2 IMPLANT
DRSG COVADERM 4X14 (GAUZE/BANDAGES/DRESSINGS) ×3 IMPLANT
ELECT BLADE 4.0 EZ CLEAN MEGAD (MISCELLANEOUS) ×3
ELECT REM PT RETURN 9FT ADLT (ELECTROSURGICAL) ×6
ELECTRODE BLDE 4.0 EZ CLN MEGD (MISCELLANEOUS) ×2 IMPLANT
ELECTRODE REM PT RTRN 9FT ADLT (ELECTROSURGICAL) ×4 IMPLANT
GLOVE BIO SURGEON STRL SZ 6 (GLOVE) ×18 IMPLANT
GLOVE BIO SURGEON STRL SZ 6.5 (GLOVE) IMPLANT
GLOVE BIO SURGEON STRL SZ7 (GLOVE) ×18 IMPLANT
GLOVE BIO SURGEON STRL SZ7.5 (GLOVE) IMPLANT
GLOVE BIOGEL PI IND STRL 6 (GLOVE) ×2 IMPLANT
GLOVE BIOGEL PI IND STRL 6.5 (GLOVE) IMPLANT
GLOVE BIOGEL PI IND STRL 7.0 (GLOVE) IMPLANT
GLOVE BIOGEL PI INDICATOR 6 (GLOVE) ×1
GLOVE BIOGEL PI INDICATOR 6.5 (GLOVE)
GLOVE BIOGEL PI INDICATOR 7.0 (GLOVE)
GLOVE EUDERMIC 7 POWDERFREE (GLOVE) IMPLANT
GLOVE ORTHO TXT STRL SZ7.5 (GLOVE) ×6 IMPLANT
GOWN STRL NON-REIN LRG LVL3 (GOWN DISPOSABLE) ×24 IMPLANT
HEMOSTAT POWDER SURGIFOAM 1G (HEMOSTASIS) ×9 IMPLANT
INSERT FOGARTY 61MM (MISCELLANEOUS) IMPLANT
INSERT FOGARTY XLG (MISCELLANEOUS) ×3 IMPLANT
KIT BASIN OR (CUSTOM PROCEDURE TRAY) ×3 IMPLANT
KIT ROOM TURNOVER OR (KITS) ×3 IMPLANT
KIT SUCTION CATH 14FR (SUCTIONS) ×15 IMPLANT
KIT VASOVIEW W/TROCAR VH 2000 (KITS) ×3 IMPLANT
LEAD PACING MYOCARDI (MISCELLANEOUS) ×3 IMPLANT
MARKER GRAFT CORONARY BYPASS (MISCELLANEOUS) ×9 IMPLANT
NS IRRIG 1000ML POUR BTL (IV SOLUTION) ×15 IMPLANT
PACK OPEN HEART (CUSTOM PROCEDURE TRAY) ×3 IMPLANT
PAD ARMBOARD 7.5X6 YLW CONV (MISCELLANEOUS) ×3 IMPLANT
PENCIL BUTTON HOLSTER BLD 10FT (ELECTRODE) ×3 IMPLANT
PUNCH AORTIC ROT 4.0MM RCL 40 (MISCELLANEOUS) ×3 IMPLANT
PUNCH AORTIC ROTATE 4.0MM (MISCELLANEOUS) IMPLANT
PUNCH AORTIC ROTATE 4.5MM 8IN (MISCELLANEOUS) IMPLANT
PUNCH AORTIC ROTATE 5MM 8IN (MISCELLANEOUS) IMPLANT
SOLUTION ANTI FOG 6CC (MISCELLANEOUS) IMPLANT
SPONGE GAUZE 4X4 12PLY (GAUZE/BANDAGES/DRESSINGS) ×6 IMPLANT
SPONGE LAP 18X18 X RAY DECT (DISPOSABLE) ×3 IMPLANT
SPONGE LAP 4X18 X RAY DECT (DISPOSABLE) ×6 IMPLANT
SUT BONE WAX W31G (SUTURE) ×6 IMPLANT
SUT ETHIBOND X763 2 0 SH 1 (SUTURE) ×6 IMPLANT
SUT MNCRL AB 3-0 PS2 18 (SUTURE) ×6 IMPLANT
SUT MNCRL AB 4-0 PS2 18 (SUTURE) ×3 IMPLANT
SUT PDS AB 1 CTX 36 (SUTURE) ×6 IMPLANT
SUT PROLENE 2 0 SH DA (SUTURE) IMPLANT
SUT PROLENE 3 0 SH DA (SUTURE) ×3 IMPLANT
SUT PROLENE 3 0 SH1 36 (SUTURE) IMPLANT
SUT PROLENE 4 0 RB 1 (SUTURE)
SUT PROLENE 4 0 SH DA (SUTURE) IMPLANT
SUT PROLENE 4-0 RB1 .5 CRCL 36 (SUTURE) IMPLANT
SUT PROLENE 5 0 C 1 36 (SUTURE) IMPLANT
SUT PROLENE 6 0 C 1 30 (SUTURE) ×12 IMPLANT
SUT PROLENE 7.0 RB 3 (SUTURE) ×18 IMPLANT
SUT PROLENE 8 0 BV175 6 (SUTURE) ×3 IMPLANT
SUT PROLENE BLUE 7 0 (SUTURE) ×6 IMPLANT
SUT PROLENE POLY MONO (SUTURE) ×3 IMPLANT
SUT SILK  1 MH (SUTURE) ×1
SUT SILK 1 MH (SUTURE) ×2 IMPLANT
SUT STEEL 6MS V (SUTURE) IMPLANT
SUT STEEL STERNAL CCS#1 18IN (SUTURE) ×3 IMPLANT
SUT STEEL SZ 6 DBL 3X14 BALL (SUTURE) ×6 IMPLANT
SUT VIC AB 1 CTX 18 (SUTURE) ×3 IMPLANT
SUT VIC AB 1 CTX 36 (SUTURE) ×1
SUT VIC AB 1 CTX36XBRD ANBCTR (SUTURE) ×2 IMPLANT
SUT VIC AB 2-0 CT1 27 (SUTURE) ×1
SUT VIC AB 2-0 CT1 TAPERPNT 27 (SUTURE) ×2 IMPLANT
SUT VIC AB 2-0 CTX 27 (SUTURE) IMPLANT
SUT VIC AB 3-0 SH 27 (SUTURE)
SUT VIC AB 3-0 SH 27X BRD (SUTURE) IMPLANT
SUT VIC AB 3-0 X1 27 (SUTURE) IMPLANT
SUT VICRYL 4-0 PS2 18IN ABS (SUTURE) IMPLANT
SUTURE E-PAK OPEN HEART (SUTURE) ×3 IMPLANT
SYSTEM SAHARA CHEST DRAIN ATS (WOUND CARE) ×3 IMPLANT
TAPE CLOTH SURG 4X10 WHT LF (GAUZE/BANDAGES/DRESSINGS) ×3 IMPLANT
TOWEL OR 17X24 6PK STRL BLUE (TOWEL DISPOSABLE) ×6 IMPLANT
TOWEL OR 17X26 10 PK STRL BLUE (TOWEL DISPOSABLE) ×6 IMPLANT
TRAY FOLEY IC TEMP SENS 14FR (CATHETERS) ×3 IMPLANT
TUBE SUCT INTRACARD DLP 20F (MISCELLANEOUS) ×3 IMPLANT
TUBING INSUFFLATION 10FT LAP (TUBING) ×3 IMPLANT
UNDERPAD 30X30 INCONTINENT (UNDERPADS AND DIAPERS) ×3 IMPLANT
WATER STERILE IRR 1000ML POUR (IV SOLUTION) ×6 IMPLANT

## 2012-10-20 NOTE — Progress Notes (Signed)
Patient placed back on full support, failed SICU wean again due to abg results pH (7.26).  Will reassess in one hour.

## 2012-10-20 NOTE — Progress Notes (Signed)
Dr Cornelius Moras called and updated on pre - extubation ABG, pH 7.23, CO2 62.2, spO2 66, BEB -3. Patient flipped over to full support and rate adjusted to 14 per MD.

## 2012-10-20 NOTE — Op Note (Signed)
CARDIOTHORACIC SURGERY OPERATIVE NOTE  Date of Procedure: 10/20/2012  Preoperative Diagnosis: Severe 3-vessel Coronary Artery Disease  Postoperative Diagnosis: Same  Procedure:   Coronary Artery Bypass Grafting x 3  Left Internal Mammary Artery to Distal Left Anterior Descending Coronary Artery Saphenous Vein Graft to Posterior Descending Coronary Artery Saphenous Vein Graft to Obtuse Marginal Branch of Left Circumflex Coronary Artery Endoscopic Vein Harvest from Right Thigh and Lower Leg  Surgeon: Salvatore Decent. Cornelius Moras, MD  Assistant: Coral Ceo, PA-C  Anesthesia: Judie Petit, MD   Operative Findings:  Normal LV systolic function  Mild-moderate LV hypertrophy  Fair to good quality LIMA for grafting  Good quality SVG for grafting  Intramyocardial LAD  Good quality LAD and OM branch for grafting  Poor quality target vessel in distal RCA distribution    BRIEF CLINICAL NOTE AND INDICATIONS FOR SURGERY  Patient is a 67 year old male with known history of coronary artery disease, hypertension, hyperlipidemia, and long-standing tobacco abuse. The patient underwent cardiac catheterization in 2004 with symptoms of stable angina and was found to have chronic occlusion of the right coronary artery with left-to-right collaterals and insignificant disease in the left coronary system. He was treated medically. He has done well until approximately 3 or 4 weeks ago when he first began to develop classical symptoms of exertional angina. At the time he was helping a friend frame a house and he began to experience some substernal chest pain radiating to the jaw that was usually promptly relieved by either rest or sublingual nitroglycerin. Over the last 3 weeks symptoms continued to accelerate until yesterday when the patient developed more severe episode of pain just while walking a short distance. The pain again was relieved by sublingual nitroglycerin.  He subsequently presented to the  emergency department at [facility] where EKG was performed demonstrating subtle ST segment depression in the anterolateral leads. Troponin levels have remained negative. He underwent diagnostic cardiac catheterization earlier today by Dr. Rennis Golden demonstrating left main disease with severe three-vessel coronary artery disease.  Left ventricular function is fairly well preserved.  Cardiothoracic surgical consultation was requested.  The patient has been seen in consultation and counseled at length regarding the indications, risks and potential benefits of surgery.  All questions have been answered, and the patient provides full informed consent for the operation as described.     DETAILS OF THE OPERATIVE PROCEDURE  The patient is brought to the operating room on the above mentioned date and central monitoring was established by the anesthesia team including placement of Swan-Ganz catheter and radial arterial line. The patient is placed in the supine position on the operating table.  Intravenous antibiotics are administered. General endotracheal anesthesia is induced uneventfully. A Foley catheter is placed.  Baseline transesophageal echocardiogram was performed.  Findings were notable for normal LV systolic function.  There was trivial mitral regurgitation.  The patient's chest, abdomen, both groins, and both lower extremities are prepared and draped in a sterile manner. A time out procedure is performed.  A median sternotomy incision was performed and the left internal mammary artery is dissected from the chest wall and prepared for bypass grafting. The left internal mammary artery is notably good quality conduit with exception of one area with non-obstructive atherosclerotic plaque. Simultaneously, saphenous vein is obtained from the patient's using endoscopic vein harvest technique. The saphenous vein is notably good quality conduit. After removal of the saphenous vein, the small surgical  incisions in the lower extremity are closed with absorbable suture. Following systemic  heparinization, the left internal mammary artery was transected distally noted to have excellent flow.  The pericardium is opened. The ascending aorta is normal in appearance. The ascending aorta and the right atrium are cannulated for cardioplegia bypass.  Adequate heparinization is verified.    The entire pre-bypass portion of the operation was notable for stable hemodynamics.  Cardiopulmonary bypass was begun and the surface of the heart is inspected. Distal target vessels are selected for coronary artery bypass grafting. A cardioplegia cannula is placed in the ascending aorta.  A temperature probe was placed in the interventricular septum.  The patient is allowed to cool passively to [facility] systemic temperature.  The aortic cross clamp is applied and cold blood cardioplegia is delivered initially in an antegrade fashion through the aortic root.  Iced saline slush is applied for topical hypothermia.  The initial cardioplegic arrest is rapid with early diastolic arrest.  Repeat doses of cardioplegia are administered intermittently throughout the entire cross clamp portion of the operation through the aortic root and through subsequently placed vein grafts in order to maintain completely flat electrocardiogram and septal myocardial temperature below 15C.  Myocardial protection was felt to be excellent.  The following distal coronary artery bypass grafts were performed:   The posterior descending branch of the right coronary artery was grafted using a reversed saphenous vein graft in an end-to-side fashion.  At the site of distal anastomosis the target vessel was poor quality and measured approximately 1.0 mm in diameter.  The obtuse marginal branch of the left circumflex coronary artery was grafted using a reversed saphenous vein graft in an end-to-side fashion.  At the site of distal anastomosis the target vessel  was good quality and measured approximately 2.0 mm in diameter.  The distal left anterior coronary artery was grafted with the left internal mammary artery in an end-to-side fashion.  At the site of distal anastomosis the target vessel was deeply intramyocardial but otherwise good quality and measured approximately 2.0 mm in diameter.   All proximal vein graft anastomoses were placed directly to the ascending aorta prior to removal of the aortic cross clamp.  The septal myocardial temperature rose rapidly after reperfusion of the left internal mammary artery graft.  The aortic cross clamp was removed after a total cross clamp time of 80 minutes.  All proximal and distal coronary anastomoses were inspected for hemostasis and appropriate graft orientation. Epicardial pacing wires are fixed to the right ventricular outflow tract and to the right atrial appendage. The patient is rewarmed to 37C temperature. The patient is weaned and disconnected from cardiopulmonary bypass.  The patient's rhythm at separation from bypass was sinus.  The patient was weaned from cardioplegic bypass  without any inotropic support. Total cardiopulmonary bypass time for the operation was 97 minutes.  Followup transesophageal echocardiogram performed after separation from bypass revealed no changes from the preoperative exam.  The aortic and venous cannula were removed uneventfully. Protamine was administered to reverse the anticoagulation. The mediastinum and pleural space were inspected for hemostasis and irrigated with saline solution. The mediastinum and the left pleural space were drained using 3 chest tubes placed through separate stab incisions inferiorly.  The soft tissues anterior to the aorta were reapproximated loosely. The sternum is closed with double strength sternal wire. The soft tissues anterior to the sternum were closed in multiple layers and the skin is closed with a running subcuticular skin closure.  The  post-bypass portion of the operation was notable for stable rhythm and hemodynamics.  No blood products were administered during the operation.  The patient tolerated the procedure well and is transported to the surgical intensive care in stable condition. There are no intraoperative complications. All sponge instrument and needle counts are verified correct at completion of the operation.    Salvatore Decent. Cornelius Moras MD 10/20/2012 1:09 PM

## 2012-10-20 NOTE — Anesthesia Postprocedure Evaluation (Signed)
  Anesthesia Post-op Note  Patient: [name]  Procedure(s) Performed: Procedure(s): CORONARY ARTERY BYPASS GRAFTING (CABG) (N/A) INTRAOPERATIVE TRANSESOPHAGEAL ECHOCARDIOGRAM (N/A)  Patient Location: PACU and SICU  Anesthesia Type:General  Level of Consciousness: sedated  Airway and Oxygen Therapy: Patient remains intubated per anesthesia plan  Post-op Pain: mild  Post-op Assessment: Post-op Vital signs reviewed  Post-op Vital Signs: Reviewed  Complications: No apparent anesthesia complications

## 2012-10-20 NOTE — Progress Notes (Signed)
  Echocardiogram Echocardiogram Transesophageal has been performed.  [name] A 10/20/2012, 9:07 AM

## 2012-10-20 NOTE — Transfer of Care (Signed)
Immediate Anesthesia Transfer of Care Note  Patient: [name]  Procedure(s) Performed: Procedure(s): CORONARY ARTERY BYPASS GRAFTING (CABG) (N/A) INTRAOPERATIVE TRANSESOPHAGEAL ECHOCARDIOGRAM (N/A)  Patient Location: ICU 2305  Anesthesia Type:General  Level of Consciousness: sedated  Airway & Oxygen Therapy: Patient remains intubated per anesthesia plan and Patient placed on Ventilator (see vital sign flow sheet for setting)  Post-op Assessment: Post -op Vital signs reviewed and stable and report given to [employer], [place] 1610  Post vital signs: Reviewed and stable  Complications: No apparent anesthesia complications

## 2012-10-20 NOTE — Progress Notes (Signed)
PT back to full support due to ABG results, CO2 62

## 2012-10-20 NOTE — Brief Op Note (Addendum)
10/18/2012 - 10/20/2012  11:43 AM  PATIENT:  [name]  67 y.o. male  PRE-OPERATIVE DIAGNOSIS:  CAD  POST-OPERATIVE DIAGNOSIS:  CAD  PROCEDURE:    CORONARY ARTERY BYPASS GRAFTING x 3 (LIMA-LAD, SVG-OM, SVG-PDA)  EVH RIGHT LEG  SURGEON:    Purcell Nails, MD  ASSISTANTS:  Coral Ceo, PA-C  ANESTHESIA:   Judie Petit, MD  CROSSCLAMP TIME:   80  CARDIOPULMONARY BYPASS TIME: 97  FINDINGS:  Normal LV systolic function  Mild-moderate LV hypertrophy  Fair to good quality LIMA for grafting  Good quality SVG for grafting  Intramyocardial LAD  Good quality LAD and OM branch for grafting  Poor quality target vessel in distal RCA distribution  COMPLICATIONS: none  PRE-OPERATIVE WEIGHT: 94.1 kg  PATIENT DISPOSITION:   TO SICU IN STABLE CONDITION  [name],[name] 10/20/2012 1:02 PM

## 2012-10-20 NOTE — Progress Notes (Signed)
Patient placed back on full support, fails SICU wean with acidotic abg (pH 7.28).

## 2012-10-20 NOTE — Anesthesia Preprocedure Evaluation (Addendum)
Anesthesia Evaluation  Patient identified by MRN, date of birth, ID band Patient awake    Reviewed: Allergy & Precautions, H&P , NPO status , Patient's Chart, lab work & pertinent test results, reviewed documented beta blocker date and time   History of Anesthesia Complications Negative for: history of anesthetic complications  Airway Mallampati: II      Dental   Pulmonary Current Smoker,  breath sounds clear to auscultation        Cardiovascular hypertension, Pt. on medications and Pt. on home beta blockers + angina with exertion + CAD Rhythm:Regular Rate:Normal     Neuro/Psych negative neurological ROS  negative psych ROS   GI/Hepatic negative GI ROS, Neg liver ROS,   Endo/Other  negative endocrine ROS  Renal/GU negative Renal ROS     Musculoskeletal   Abdominal   Peds  Hematology negative hematology ROS (+)   Anesthesia Other Findings   Reproductive/Obstetrics                          Anesthesia Physical Anesthesia Plan  ASA: III  Anesthesia Plan: General   Post-op Pain Management:    Induction: Intravenous  Airway Management Planned: Oral ETT  Additional Equipment: Arterial line, PA Cath and TEE  Intra-op Plan:   Post-operative Plan: Post-operative intubation/ventilation  Informed Consent: I have reviewed the patients History and Physical, chart, labs and discussed the procedure including the risks, benefits and alternatives for the proposed anesthesia with the patient or authorized representative who has indicated his/her understanding and acceptance.   Dental advisory given  Plan Discussed with: CRNA, Anesthesiologist and Surgeon  Anesthesia Plan Comments:         Anesthesia Quick Evaluation

## 2012-10-20 NOTE — Preoperative (Signed)
Beta Blockers   Reason not to administer Beta Blockers:Not Applicable, took metoprolol this am 

## 2012-10-20 NOTE — Progress Notes (Signed)
Patient ID: [name], male   DOB: 09/30/1945, 66 y.o.   MRN: [id number]   SICU Evening Rounds:  Hemodynamically stable  DDD paced at 80  Awake but not quite ready for extubation yet.  Urine output good  CT output low.  CBC    Component Value Date/Time   WBC 15.5* 10/20/2012 2000   RBC 4.19* 10/20/2012 2000   HGB 12.6* 10/20/2012 2001   HCT 37.0* 10/20/2012 2001   PLT 136* 10/20/2012 2000   MCV 89.3 10/20/2012 2000   MCH 31.3 10/20/2012 2000   MCHC 35.0 10/20/2012 2000   RDW 12.1 10/20/2012 2000   LYMPHSABS 1.5 10/18/2012 1029   MONOABS 0.7 10/18/2012 1029   EOSABS 0.2 10/18/2012 1029   BASOSABS 0.0 10/18/2012 1029    BMET    Component Value Date/Time   NA 139 10/20/2012 2001   K 4.8 10/20/2012 2001   CL 106 10/20/2012 2001   CO2 31 10/20/2012 0449   GLUCOSE 120* 10/20/2012 2001   BUN 8 10/20/2012 2001   CREATININE 0.60 10/20/2012 2001   CALCIUM 8.7 10/20/2012 0449   GFRNONAA >90 10/20/2012 2000   GFRAA >90 10/20/2012 2000

## 2012-10-21 ENCOUNTER — Inpatient Hospital Stay (HOSPITAL_COMMUNITY): Payer: Medicare Other

## 2012-10-21 ENCOUNTER — Encounter (HOSPITAL_COMMUNITY): Payer: Self-pay | Admitting: Thoracic Surgery (Cardiothoracic Vascular Surgery)

## 2012-10-21 LAB — POCT I-STAT 3, ART BLOOD GAS (G3+)
Acid-base deficit: 2 mmol/L (ref 0.0–2.0)
Acid-base deficit: 3 mmol/L — ABNORMAL HIGH (ref 0.0–2.0)
Acid-base deficit: 2 mmol/L (ref 0.0–2.0)
Acid-base deficit: 3 mmol/L — ABNORMAL HIGH (ref 0.0–2.0)
Bicarbonate: 25.8 mEq/L — ABNORMAL HIGH (ref 20.0–24.0)
Bicarbonate: 24.5 mEq/L — ABNORMAL HIGH (ref 20.0–24.0)
O2 Saturation: 93 %
O2 Saturation: 93 %
O2 Saturation: 90 %
Patient temperature: 36.7
Patient temperature: 36.9
Patient temperature: 37.2
TCO2: 27 mmol/L (ref 0–100)
TCO2: 26 mmol/L (ref 0–100)
pO2, Arterial: 79 mmHg — ABNORMAL LOW (ref 80.0–100.0)
pO2, Arterial: 90 mmHg (ref 80.0–100.0)

## 2012-10-21 LAB — CREATININE, SERUM
Creatinine, Ser: 1.23 mg/dL (ref 0.50–1.35)
GFR calc non Af Amer: 59 mL/min — ABNORMAL LOW (ref >90)

## 2012-10-21 LAB — MAGNESIUM
Magnesium: 2.6 mg/dL — ABNORMAL HIGH (ref 1.5–2.5)
Magnesium: 2.5 mg/dL (ref 1.5–2.5)

## 2012-10-21 LAB — CBC
MCHC: 34.2 g/dL (ref 30.0–36.0)
MCV: 89.9 fL (ref 78.0–100.0)
Platelets: 125 10*3/uL — ABNORMAL LOW (ref 150–400)
RDW: 12.2 % (ref 11.5–15.5)
RDW: 12.5 % (ref 11.5–15.5)
WBC: 12.9 10*3/uL — ABNORMAL HIGH (ref 4.0–10.5)

## 2012-10-21 LAB — POCT I-STAT, CHEM 8
BUN: 17 mg/dL (ref 6–23)
Creatinine, Ser: 1.1 mg/dL (ref 0.50–1.35)
Glucose, Bld: 132 mg/dL — ABNORMAL HIGH (ref 70–99)
Hemoglobin: 12.9 g/dL — ABNORMAL LOW (ref 13.0–17.0)
TCO2: 27 mmol/L (ref 0–100)

## 2012-10-21 LAB — GLUCOSE, CAPILLARY
Glucose-Capillary: 150 mg/dL — ABNORMAL HIGH (ref 70–99)
Glucose-Capillary: 121 mg/dL — ABNORMAL HIGH (ref 70–99)
Glucose-Capillary: 101 mg/dL — ABNORMAL HIGH (ref 70–99)
Glucose-Capillary: 113 mg/dL — ABNORMAL HIGH (ref 70–99)
Glucose-Capillary: 115 mg/dL — ABNORMAL HIGH (ref 70–99)
Glucose-Capillary: 120 mg/dL — ABNORMAL HIGH (ref 70–99)
Glucose-Capillary: 121 mg/dL — ABNORMAL HIGH (ref 70–99)
Glucose-Capillary: 148 mg/dL — ABNORMAL HIGH (ref 70–99)

## 2012-10-21 LAB — BASIC METABOLIC PANEL
Chloride: 108 mEq/L (ref 96–112)
Creatinine, Ser: 0.82 mg/dL (ref 0.50–1.35)
GFR calc Af Amer: >90 mL/min (ref >90)
Potassium: 4.6 mEq/L (ref 3.5–5.1)
Sodium: 138 mEq/L (ref 135–145)

## 2012-10-21 LAB — TROPONIN I: Troponin I: 2.27 ng/mL (ref <0.30)

## 2012-10-21 LAB — CK TOTAL AND CKMB (NOT AT ARMC): Relative Index: 2.6 — ABNORMAL HIGH (ref 0.0–2.5)

## 2012-10-21 MED ORDER — ATORVASTATIN CALCIUM 20 MG PO TABS
20.0000 mg | ORAL_TABLET | Freq: Every day | ORAL | Status: DC
  Administered 2012-10-21 – 2012-10-24 (×4): 20 mg via ORAL
  Filled 2012-10-21 (×5): qty 1

## 2012-10-21 MED ORDER — INSULIN ASPART 100 UNIT/ML ~~LOC~~ SOLN
0.0000 [IU] | SUBCUTANEOUS | Status: AC
  Administered 2012-10-21: 2 [IU] via SUBCUTANEOUS

## 2012-10-21 MED ORDER — FUROSEMIDE 10 MG/ML IJ SOLN
20.0000 mg | Freq: Four times a day (QID) | INTRAMUSCULAR | Status: AC
  Administered 2012-10-21: 10:00:00 via INTRAVENOUS
  Administered 2012-10-21 (×2): 20 mg via INTRAVENOUS
  Filled 2012-10-21 (×2): qty 2

## 2012-10-21 MED ORDER — NITROGLYCERIN 0.4 MG SL SUBL
SUBLINGUAL_TABLET | SUBLINGUAL | Status: AC
  Administered 2012-10-21: 0.4 mg via SUBLINGUAL
  Filled 2012-10-21: qty 25

## 2012-10-21 MED ORDER — INSULIN ASPART 100 UNIT/ML ~~LOC~~ SOLN: 0.0000 [IU] | SUBCUTANEOUS | Status: DC

## 2012-10-21 MED ORDER — MORPHINE SULFATE 2 MG/ML IJ SOLN
2.0000 mg | INTRAMUSCULAR | Status: DC | PRN
  Administered 2012-10-21 (×4): 2 mg via INTRAVENOUS
  Filled 2012-10-21 (×4): qty 1

## 2012-10-21 MED ORDER — NITROGLYCERIN 0.4 MG SL SUBL: 0.4000 mg | SUBLINGUAL_TABLET | SUBLINGUAL | Status: DC | PRN

## 2012-10-21 MED ORDER — INSULIN DETEMIR 100 UNIT/ML ~~LOC~~ SOLN
20.0000 [IU] | Freq: Every day | SUBCUTANEOUS | Status: DC
  Administered 2012-10-21 – 2012-10-22 (×2): 20 [IU] via SUBCUTANEOUS
  Filled 2012-10-21 (×3): qty 0.2

## 2012-10-21 MED FILL — Mannitol IV Soln 20%: INTRAVENOUS | Qty: 500 | Status: AC

## 2012-10-21 MED FILL — Sodium Bicarbonate IV Soln 8.4%: INTRAVENOUS | Qty: 50 | Status: AC

## 2012-10-21 MED FILL — Sodium Chloride Irrigation Soln 0.9%: Qty: 3000 | Status: AC

## 2012-10-21 MED FILL — Heparin Sodium (Porcine) Inj 1000 Unit/ML: INTRAMUSCULAR | Qty: 30 | Status: AC

## 2012-10-21 MED FILL — Electrolyte-R (PH 7.4) Solution: INTRAVENOUS | Qty: 4000 | Status: AC

## 2012-10-21 MED FILL — Magnesium Sulfate Inj 50%: INTRAMUSCULAR | Qty: 10 | Status: AC

## 2012-10-21 MED FILL — Lidocaine HCl IV Inj 20 MG/ML: INTRAVENOUS | Qty: 5 | Status: AC

## 2012-10-21 MED FILL — Potassium Chloride Inj 2 mEq/ML: INTRAVENOUS | Qty: 40 | Status: AC

## 2012-10-21 MED FILL — Sodium Chloride IV Soln 0.9%: INTRAVENOUS | Qty: 1000 | Status: AC

## 2012-10-21 MED FILL — Heparin Sodium (Porcine) Inj 1000 Unit/ML: INTRAMUSCULAR | Qty: 10 | Status: AC

## 2012-10-21 NOTE — Progress Notes (Signed)
Patients fourth attempted wean at 0300 was failed due to another acidotic ABG of 7.26, with a CO2 of 56.2.  Patient placed back on full support.

## 2012-10-21 NOTE — Progress Notes (Signed)
[facility]  Subjective: Moderate incisional pain. Moderate acute onset of chest tightness. Mild SOB. Using supplemental O2.  Objective: Vital signs in last 24 hours: Temp:  [97 F (36.1 C)-99 F (37.2 C)] 98.5 F (36.9 C) (04/11 1139) Pulse Rate:  [69-92] 69 (04/11 1100) Resp:  [0-26] 24 (04/11 1100) BP: (88-130)/(50-72) 118/59 mmHg (04/11 1100) SpO2:  [93 %-100 %] 95 % (04/11 1100) Arterial Line BP: (83-142)/(48-76) 131/55 mmHg (04/11 1100) FiO2 (%):  [40 %-50 %] 40 % (04/11 0800) Weight:  [225 lb (102.059 kg)] 225 lb (102.059 kg) (04/11 0500) Last BM Date: 10/19/12  Intake/Output from previous day: 04/10 0701 - 04/11 0700 In: 7563.2 [I.V.:5581.2; Blood:482; IV Piggyback:1500] Out: 3440 [Urine:2285; Emesis/NG output:100; Blood:600; Chest Tube:455] Intake/Output this shift: Total I/O In: 100 [I.V.:100] Out: 305 [Urine:155; Chest Tube:150]  Medications Current Facility-Administered Medications  Medication Dose Route Frequency Provider Last Rate Last Dose  . 0.45 % sodium chloride infusion   Intravenous Continuous Purcell Nails, MD 20 mL/hr at 10/20/12 1400    . 0.9 %  sodium chloride infusion   Intravenous Continuous Purcell Nails, MD 20 mL/hr at 10/20/12 1400    . 0.9 %  sodium chloride infusion  250 mL Intravenous Continuous Purcell Nails, MD      . acetaminophen (TYLENOL) tablet 1,000 mg  1,000 mg Oral Q6H Purcell Nails, MD   1,000 mg at 10/21/12 1159  . albumin human 5 % solution 250 mL  250 mL Intravenous Q15 min PRN Purcell Nails, MD   250 mL at 10/20/12 1604  . albuterol (PROVENTIL) (5 MG/ML) 0.5% nebulizer solution 2.5 mg  2.5 mg Nebulization Q4H PRN Purcell Nails, MD   2.5 mg at 10/20/12 1802  . aspirin EC tablet 325 mg  325 mg Oral Daily Purcell Nails, MD   325 mg at 10/21/12 1159  . atorvastatin (LIPITOR) tablet 20 mg  20 mg Oral q1800 Purcell Nails, MD      . bisacodyl (DULCOLAX) EC tablet 10 mg  10 mg Oral Daily  Purcell Nails, MD   10 mg at 10/21/12 1158   Or  . bisacodyl (DULCOLAX) suppository 10 mg  10 mg Rectal Daily Purcell Nails, MD      . cefUROXime (ZINACEF) 1.5 g in dextrose 5 % 50 mL IVPB  1.5 g Intravenous Q12H Purcell Nails, MD   1.5 g at 10/21/12 8295  . docusate sodium (COLACE) capsule 200 mg  200 mg Oral Daily Purcell Nails, MD   200 mg at 10/21/12 1200  . furosemide (LASIX) injection 20 mg  20 mg Intravenous Q6H Purcell Nails, MD      . insulin aspart (novoLOG) injection 0-24 Units  0-24 Units Subcutaneous Q2H Purcell Nails, MD   2 Units at 10/21/12 6213   Followed by  . insulin aspart (novoLOG) injection 0-24 Units  0-24 Units Subcutaneous Q4H Purcell Nails, MD      . insulin detemir (LEVEMIR) injection 20 Units  20 Units Subcutaneous Q1200 Purcell Nails, MD   20 Units at 10/21/12 1200  . insulin regular (NOVOLIN R,HUMULIN R) 1 Units/mL in sodium chloride 0.9 % 100 mL infusion   Intravenous Continuous Purcell Nails, MD   0.8 Units/hr at 10/21/12 0600  . insulin regular bolus via infusion 0-10 Units  0-10 Units Intravenous TID WC Purcell Nails, MD      . ipratropium (ATROVENT) nebulizer  solution 0.5 mg  0.5 mg Nebulization Q6H PRN Purcell Nails, MD      . metoprolol (LOPRESSOR) injection 2.5-5 mg  2.5-5 mg Intravenous Q2H PRN Purcell Nails, MD      . metoprolol tartrate (LOPRESSOR) tablet 12.5 mg  12.5 mg Oral BID Purcell Nails, MD   12.5 mg at 10/21/12 1000  . morphine 2 MG/ML injection 2 mg  2 mg Intravenous Q1H PRN Purcell Nails, MD   2 mg at 10/21/12 1220  . ondansetron (ZOFRAN) injection 4 mg  4 mg Intravenous Q6H PRN Purcell Nails, MD      . oxyCODONE (Oxy IR/ROXICODONE) immediate release tablet 5-10 mg  5-10 mg Oral Q3H PRN Purcell Nails, MD   5 mg at 10/21/12 1148  . [START ON 10/22/2012] pantoprazole (PROTONIX) EC tablet 40 mg  40 mg Oral Daily Purcell Nails, MD      . sodium chloride 0.9 % injection 3 mL  3 mL Intravenous PRN Purcell Nails, MD    3 mL at 10/21/12 1220    PE: General appearance: alert, cooperative and no distress Lungs: bilateral bibasilar rales. Heart: regular rate and rhythm Extremities: no LEE Pulses: 2+ and symmetric Skin: warm and dry Neurologic: Grossly normal  Lab Results:   Recent Labs  10/20/12 1343  10/20/12 2000 10/20/12 2001 10/21/12 0407  WBC 17.9*  --  15.5*  --  12.9*  HGB 13.1  < > 13.1 12.6* 12.4*  HCT 36.9*  < > 37.4* 37.0* 35.7*  PLT 127*  --  136*  --  125*  < > = values in this interval not displayed. BMET  Recent Labs  10/19/12 0455 10/20/12 0449  10/20/12 1352 10/20/12 2000 10/20/12 2001 10/21/12 0407  NA 140 137  < > 140  --  139 138  K 4.2 3.9  < > 3.9  --  4.8 4.6  CL 104 103  --   --   --  106 108  CO2 29 31  --   --   --   --  27  GLUCOSE 97 99  < > 108*  --  120* 118*  BUN 10 8  --   --   --  8 12  CREATININE 0.82 0.79  --   --  0.73 0.60 0.82  CALCIUM 8.6 8.7  --   --   --   --  8.2*  < > = values in this interval not displayed. PT/INR  Recent Labs  10/19/12 0455 10/20/12 1343  LABPROT 13.4 15.9*  INR 1.03 1.30   Cholesterol  Recent Labs  10/19/12 0455  CHOL 117   Cardiac Enzymes Cardiac Panel (last 3 results)  Recent Labs  10/18/12 1731 10/18/12 2213 10/19/12 0455  TROPONINI <0.30 <0.30 <0.30    Assessment/Plan  Principal Problem:   S/P CABG x 3 Active Problems:   Unstable angina   HTN (hypertension)   Dyslipidemia   CAD (coronary artery disease), significant requiring CABG per cath 10/19/12   Tobacco use disorder   COPD, severe  Plan: S/P CABG x 3 (LIMA to LAD, SVG to PDA, SVG to OM of LCx). POD#1. Surgeon was Dr. Cornelius Moras. Normal progression. + incisional soreness plus new onset of chest tightness. Will order 12-lead EKG. Nurse notified and will f/u with CT surgery. No post-operative arrhthymias. NSR on telemetry. BP is stable. Keep on BID dosing of Lopressor. Also on ASA and statin. Consider resuming ACE-I prior to  discharge. Will  continue to follow.     LOS: 3 days    [name] 10/21/2012 12:30 PM   Patient seen and examined. Agree with assessment and plan. Pt had chest pain earlier. He tells me this was a different pain than previously, intensified with deep breathing. Now essentially resolved. Positive rub on exam suggestive of pleuritic/pericardial discomfort. Mild J point elevation in lead 3 with suggestion of downsloping PR segment suggestive of pericardial.  Obtain serial ECG.   Lennette Bihari, MD, [facility] 10/21/2012 1:50 PM

## 2012-10-21 NOTE — Procedures (Signed)
Extubation Procedure Note  Patient Details:   Name: [name] DOB: 12-Dec-1945 MRN: [id number]   Airway Documentation:     Evaluation  O2 sats: stable throughout and currently acceptable Complications: No apparent complications Patient did tolerate procedure well. Bilateral Breath Sounds: Clear   Yes Pt awake and alert. Extubated per MD order, pH and CO2 outside of parameters. Positive cuff leak, NIF -20, VC 700. Pt able to vocalize.  Arloa Koh 10/21/2012, 9:00 AM

## 2012-10-21 NOTE — Progress Notes (Signed)
Patient c/o chest pain 8/10 squeezing/tightening like his anginal pain that he has had in the past.  Cardiology PA here and 12 lead EKG ordered stat.  Coral Ceo, PA notified concerning above.  NTG .4mg  SL given as per orders for chest pain.  12 lead done and patient assisted back to bed.  CXR ordered.  Awaiting to be done.  BP @ 1315  75/45  HR 64.  Repeat BP now 109/49 @ 1330.

## 2012-10-21 NOTE — Progress Notes (Signed)
   CARDIOTHORACIC SURGERY PROGRESS NOTE   R1 Day Post-Op Procedure(s) (LRB): CORONARY ARTERY BYPASS GRAFTING (CABG) (N/A) INTRAOPERATIVE TRANSESOPHAGEAL ECHOCARDIOGRAM (N/A)  Subjective: Wide awake and alert on vent set CPAP/PS.  Denies pain.  Looks comfortable.  Pre-extubation parameters look good.  Objective: Vital signs: BP Readings from Last 1 Encounters:  10/21/12 121/59   Pulse Readings from Last 1 Encounters:  10/21/12 79   Resp Readings from Last 1 Encounters:  10/21/12 17   Temp Readings from Last 1 Encounters:  10/21/12 99 F (37.2 C)     Hemodynamics: PAP: (24-58)/(10-36) 48/29 mmHg CO:  [2.9 L/min-5.1 L/min] 5.1 L/min CI:  [1.4 L/min/m2-2.5 L/min/m2] 2.5 L/min/m2  Physical Exam:  Rhythm:   sinus  Breath sounds: Scattered rhonchi  Heart sounds:  RRR  Incisions:  Dressings dry  Abdomen:  Soft, non-distended, non-tender  Extremities:  Warm, well-perfused   Intake/Output from previous day: 04/10 0701 - 04/11 0700 In: 7563.2 [I.V.:5581.2; Blood:482; IV Piggyback:1500] Out: 3440 [Urine:2285; Emesis/NG output:100; Blood:600; Chest Tube:455] Intake/Output this shift:    Lab Results:  Recent Labs  10/20/12 2000 10/20/12 2001 10/21/12 0407  WBC 15.5*  --  12.9*  HGB 13.1 12.6* 12.4*  HCT 37.4* 37.0* 35.7*  PLT 136*  --  125*   BMET:  Recent Labs  10/20/12 0449  10/20/12 2001 10/21/12 0407  NA 137  < > 139 138  K 3.9  < > 4.8 4.6  CL 103  --  106 108  CO2 31  --   --  27  GLUCOSE 99  < > 120* 118*  BUN 8  --  8 12  CREATININE 0.79  < > 0.60 0.82  CALCIUM 8.7  --   --  8.2*  < > = values in this interval not displayed.  CBG (last 3)   Recent Labs  10/21/12 0100 10/21/12 0755  GLUCAP 120* 150*   ABG    Component Value Date/Time   PHART 7.260* 10/21/2012 0245   HCO3 25.3* 10/21/2012 0245   TCO2 27 10/21/2012 0245   ACIDBASEDEF 3.0* 10/21/2012 0245   O2SAT 93.0 10/21/2012 0245   CXR:   Assessment/Plan: S/P Procedure(s)  (LRB): CORONARY ARTERY BYPASS GRAFTING (CABG) (N/A) INTRAOPERATIVE TRANSESOPHAGEAL ECHOCARDIOGRAM (N/A)  Doing well POD1 Looks ready for extubation which has been held due to hypercarbia on ABG Severe COPD with pre-op ABG w/ pCO2 54 pH 7.3 pO2 62 and FEV1 < 1 L Sinus rhythm w/ stable hemodynamics off all drips Expected post op acute blood loss anemia, mild, stable Expected post op volume excess, mild   Extubate  Mobilize  D/C tubes and lines  Diuresis  Albuterol and atrovent nebs  Stop smoking    Zyen Triggs H 10/21/2012 8:39 AM

## 2012-10-21 NOTE — Progress Notes (Signed)
Had some CP earlier today  Resolved by the time I got out of OR.  He felt it was similar to his angina, but ECG showed no change  Troponin mildly elevated but not unexpected POD #1  Will check enzymes again at 2100 and 0500 to see trend  Currently comfortable

## 2012-10-21 NOTE — Progress Notes (Signed)
                    [address]            [name] [address]          [phone]        Subjective: CTSP re: chest pain.  Pt had been progressing well this am, got CTs out, etc.  Got OOB to chair and developed 5/10 chest discomfort radiating to his neck, which he states "felt like my angina."  He had just finished working on incentive spirometer and coughing when CP started. Denies SOB.  Was given Morphine and NTG with relief.     Objective: Physical Exam:  BP 103/53 HR 69 Sat 94% on 4L General appearance: alert, cooperative and no distress Heart: regular rate and rhythm, S1, S2 normal, no murmur, click, rub or gallop Lungs: Slightly diminished BS bilateral bases Extremities: Mild LE edema Wound: Dressed and dry   Lab Results: CBC: Recent Labs  10/20/12 2000 10/20/12 2001 10/21/12 0407  WBC 15.5*  --  12.9*  HGB 13.1 12.6* 12.4*  HCT 37.4* 37.0* 35.7*  PLT 136*  --  125*   BMET:  Recent Labs  10/20/12 0449  10/20/12 2001 10/21/12 0407  NA 137  < > 139 138  K 3.9  < > 4.8 4.6  CL 103  --  106 108  CO2 31  --   --  27  GLUCOSE 99  < > 120* 118*  BUN 8  --  8 12  CREATININE 0.79  < > 0.60 0.82  CALCIUM 8.7  --   --  8.2*  < > = values in this interval not displayed.  PT/INR:  Recent Labs  10/20/12 1343  LABPROT 15.9*  INR 1.30     Assessment/Plan: Chest discomfort, etiology unclear.  Improved after Morphine and NTG. Stat EKG is within normal limits, no ST/T wave changes, SR in 60s. Will check stat CXR, cardiac enzymes and follow.  COLLINS,GINA H 10/21/2012 1:16 PM

## 2012-10-21 NOTE — Progress Notes (Signed)
Critical Lab Result Received:  CK 13.6; Trop 2.27  Coral Ceo, PA notified Dr. Tresa Endo notified Dr. Dorris Fetch notified

## 2012-10-22 ENCOUNTER — Inpatient Hospital Stay (HOSPITAL_COMMUNITY): Payer: Medicare Other

## 2012-10-22 LAB — BASIC METABOLIC PANEL
BUN: 21 mg/dL (ref 6–23)
CO2: 28 mEq/L (ref 19–32)
Chloride: 101 mEq/L (ref 96–112)
Creatinine, Ser: 1.19 mg/dL (ref 0.50–1.35)
Glucose, Bld: 110 mg/dL — ABNORMAL HIGH (ref 70–99)
Potassium: 4.6 mEq/L (ref 3.5–5.1)

## 2012-10-22 LAB — CBC
HCT: 34.8 % — ABNORMAL LOW (ref 39.0–52.0)
Hemoglobin: 12.1 g/dL — ABNORMAL LOW (ref 13.0–17.0)
MCH: 31.5 pg (ref 26.0–34.0)
MCHC: 34.8 g/dL (ref 30.0–36.0)
MCV: 90.6 fL (ref 78.0–100.0)
RDW: 12.6 % (ref 11.5–15.5)

## 2012-10-22 LAB — GLUCOSE, CAPILLARY
Glucose-Capillary: 103 mg/dL — ABNORMAL HIGH (ref 70–99)
Glucose-Capillary: 101 mg/dL — ABNORMAL HIGH (ref 70–99)
Glucose-Capillary: 90 mg/dL (ref 70–99)
Glucose-Capillary: 108 mg/dL — ABNORMAL HIGH (ref 70–99)

## 2012-10-22 LAB — TROPONIN I: Troponin I: 1.55 ng/mL (ref <0.30)

## 2012-10-22 MED ORDER — FUROSEMIDE 40 MG PO TABS
40.0000 mg | ORAL_TABLET | Freq: Every day | ORAL | Status: DC
  Administered 2012-10-22 – 2012-10-25 (×4): 40 mg via ORAL
  Filled 2012-10-22 (×5): qty 1

## 2012-10-22 NOTE — Progress Notes (Signed)
[facility]  Subjective: Still some incisional discomfort. No further chest pain since yesterday. Still a bit short of breath.   Objective: Vital signs in last 24 hours: Temp:  [97.5 F (36.4 C)-98.8 F (37.1 C)] 97.6 F (36.4 C) (04/12 0813) Pulse Rate:  [62-90] 90 (04/12 0500) Resp:  [17-28] 20 (04/12 0700) BP: (78-134)/(45-83) 134/57 mmHg (04/12 0700) SpO2:  [93 %-100 %] 95 % (04/12 0500) Arterial Line BP: (119-142)/(53-56) 119/56 mmHg (04/11 1200) Weight:  [223 lb 4.8 oz (101.288 kg)] 223 lb 4.8 oz (101.288 kg) (04/12 0500) Last BM Date: 10/19/12  Intake/Output from previous day: 04/11 0701 - 04/12 0700 In: 1040 [P.O.:440; I.V.:500; IV Piggyback:100] Out: 1090 [Urine:940; Chest Tube:150] Intake/Output this shift:    Medications Current Facility-Administered Medications  Medication Dose Route Frequency Provider Last Rate Last Dose  . 0.45 % sodium chloride infusion   Intravenous Continuous Purcell Nails, MD 20 mL/hr at 10/21/12 1919    . 0.9 %  sodium chloride infusion   Intravenous Continuous Purcell Nails, MD 20 mL/hr at 10/20/12 1400    . 0.9 %  sodium chloride infusion  250 mL Intravenous Continuous Purcell Nails, MD      . acetaminophen (TYLENOL) tablet 1,000 mg  1,000 mg Oral Q6H Purcell Nails, MD   1,000 mg at 10/22/12 2130  . albuterol (PROVENTIL) (5 MG/ML) 0.5% nebulizer solution 2.5 mg  2.5 mg Nebulization Q4H PRN Purcell Nails, MD   2.5 mg at 10/21/12 1944  . aspirin EC tablet 325 mg  325 mg Oral Daily Purcell Nails, MD   325 mg at 10/21/12 1159  . atorvastatin (LIPITOR) tablet 20 mg  20 mg Oral q1800 Purcell Nails, MD   20 mg at 10/21/12 1722  . bisacodyl (DULCOLAX) EC tablet 10 mg  10 mg Oral Daily Purcell Nails, MD   10 mg at 10/21/12 1158   Or  . bisacodyl (DULCOLAX) suppository 10 mg  10 mg Rectal Daily Purcell Nails, MD      . docusate sodium (COLACE) capsule 200 mg  200 mg Oral Daily Purcell Nails, MD    200 mg at 10/21/12 1200  . furosemide (LASIX) tablet 40 mg  40 mg Oral Daily Loreli Slot, MD      . insulin aspart (novoLOG) injection 0-24 Units  0-24 Units Subcutaneous Q4H Purcell Nails, MD      . insulin detemir (LEVEMIR) injection 20 Units  20 Units Subcutaneous Q1200 Purcell Nails, MD   20 Units at 10/21/12 1200  . ipratropium (ATROVENT) nebulizer solution 0.5 mg  0.5 mg Nebulization Q6H PRN Purcell Nails, MD      . metoprolol (LOPRESSOR) injection 2.5-5 mg  2.5-5 mg Intravenous Q2H PRN Purcell Nails, MD      . metoprolol tartrate (LOPRESSOR) tablet 12.5 mg  12.5 mg Oral BID Purcell Nails, MD   12.5 mg at 10/21/12 2120  . morphine 2 MG/ML injection 2 mg  2 mg Intravenous Q1H PRN Purcell Nails, MD   2 mg at 10/21/12 2303  . nitroGLYCERIN (NITROSTAT) SL tablet 0.4 mg  0.4 mg Sublingual Q5 min PRN Wilmon Pali, PA-C   0.4 mg at 10/21/12 1317  . ondansetron (ZOFRAN) injection 4 mg  4 mg Intravenous Q6H PRN Purcell Nails, MD      . oxyCODONE (Oxy IR/ROXICODONE) immediate release tablet 5-10 mg  5-10 mg Oral Q3H PRN  Purcell Nails, MD   5 mg at 10/22/12 0113  . pantoprazole (PROTONIX) EC tablet 40 mg  40 mg Oral Daily Purcell Nails, MD      . sodium chloride 0.9 % injection 3 mL  3 mL Intravenous PRN Purcell Nails, MD   3 mL at 10/21/12 1220    PE: General appearance: alert, cooperative, no distress and moderately obese Lungs: bibasilar rales Heart: regular rate and rhythm Extremities: no LEE Pulses: 2+ and symmetric Skin: warm and dry Neurologic: Grossly normal  Lab Results:   Recent Labs  10/21/12 0407 10/21/12 1750 10/21/12 1800 10/22/12 0350  WBC 12.9*  --  20.3* 19.3*  HGB 12.4* 12.9* 12.7* 12.1*  HCT 35.7* 38.0* 37.1* 34.8*  PLT 125*  --  141* 109*   BMET  Recent Labs  10/20/12 0449  10/21/12 0407 10/21/12 1750 10/21/12 1800 10/22/12 0350  NA 137  < > 138 138  --  134*  K 3.9  < > 4.6 4.4  --  4.6  CL 103  < > 108 102  --  101  CO2  31  --  27  --   --  28  GLUCOSE 99  < > 118* 132*  --  110*  BUN 8  < > 12 17  --  21  CREATININE 0.79  < > 0.82 1.10 1.23 1.19  CALCIUM 8.7  --  8.2*  --   --  8.8  < > = values in this interval not displayed. PT/INR  Recent Labs  10/20/12 1343  LABPROT 15.9*  INR 1.30    Assessment/Plan  Principal Problem:   S/P CABG x 3 Active Problems:   Unstable angina   HTN (hypertension)   Dyslipidemia   CAD (coronary artery disease), significant requiring CABG per cath 10/19/12   Tobacco use disorder   COPD, severe  Plan:  S/P CABG x 3 (LIMA to LAD, SVG to PDA, SVG to OM of LCx). POD#2. Progressing well. Pt had several episodes of chest pain yesterday. EKGs showed no acute changes. No further chest pain this am. + Mild incisional discomfort. BP and HR both stable. He has had some PVCs but no post-operative a-fib or other arrhythmias. Keep on BID BB. Will continue to monitor.      LOS: 4 days    Brittainy M. Delmer Islam 10/22/2012 8:28 AM  Agree with note written by Boyce Medici  PAC  POD #2 CABG X 3. Looks great!!!. Some atypical CP yesterday. Trop trending down. Exam benign except for some right basilar crackles. NSR. Nl progression. transfer to tele per TCTS.   Runell Gess 10/22/2012 8:49 AM

## 2012-10-22 NOTE — Progress Notes (Signed)
Feels well this afternoon  No CP other than mild incisional discomfort  BP 127/54  Pulse 81  Temp(Src) 98.2 F (36.8 C) (Oral)  Resp 25  Ht 5\' 7"  (1.702 m)  Wt 223 lb 4.8 oz (101.288 kg)  BMI 34.97 kg/m2  SpO2 97%   Intake/Output Summary (Last 24 hours) at 10/22/12 1832 Last data filed at 10/22/12 1700  Gross per 24 hour  Intake    930 ml  Output   1285 ml  Net   -355 ml    Continue current care

## 2012-10-22 NOTE — Progress Notes (Signed)
2 Days Post-Op Procedure(s) (LRB): CORONARY ARTERY BYPASS GRAFTING (CABG) (N/A) INTRAOPERATIVE TRANSESOPHAGEAL ECHOCARDIOGRAM (N/A) Subjective: Feels better this AM Some more CP last PM- sounds more like incisional rather than anginal pain  Objective: Vital signs in last 24 hours: Temp:  [97.5 F (36.4 C)-98.8 F (37.1 C)] 97.6 F (36.4 C) (04/12 0813) Pulse Rate:  [62-90] 90 (04/12 0500) Cardiac Rhythm:  [-] Normal sinus rhythm (04/12 0700) Resp:  [17-28] 20 (04/12 0700) BP: (78-134)/(45-83) 134/57 mmHg (04/12 0700) SpO2:  [93 %-100 %] 95 % (04/12 0500) Arterial Line BP: (119-142)/(53-56) 119/56 mmHg (04/11 1200) Weight:  [223 lb 4.8 oz (101.288 kg)] 223 lb 4.8 oz (101.288 kg) (04/12 0500)  Hemodynamic parameters for last 24 hours: PAP: (41)/(17) 41/17 mmHg  Intake/Output from previous day: 04/11 0701 - 04/12 0700 In: 1040 [P.O.:440; I.V.:500; IV Piggyback:100] Out: 1090 [Urine:940; Chest Tube:150] Intake/Output this shift:    General appearance: alert and no distress Neurologic: intact Heart: regular rate and rhythm Lungs: diminished breath sounds bibasilar Abdomen: normal findings: soft, non-tender  Lab Results:  Recent Labs  10/21/12 1800 10/22/12 0350  WBC 20.3* 19.3*  HGB 12.7* 12.1*  HCT 37.1* 34.8*  PLT 141* 109*   BMET:  Recent Labs  10/21/12 0407 10/21/12 1750 10/21/12 1800 10/22/12 0350  NA 138 138  --  134*  K 4.6 4.4  --  4.6  CL 108 102  --  101  CO2 27  --   --  28  GLUCOSE 118* 132*  --  110*  BUN 12 17  --  21  CREATININE 0.82 1.10 1.23 1.19  CALCIUM 8.2*  --   --  8.8    PT/INR:  Recent Labs  10/20/12 1343  LABPROT 15.9*  INR 1.30   ABG    Component Value Date/Time   PHART 7.265* 10/21/2012 1021   HCO3 24.5* 10/21/2012 1021   TCO2 27 10/21/2012 1750   ACIDBASEDEF 3.0* 10/21/2012 1021   O2SAT 90.0 10/21/2012 1021   CBG (last 3)   Recent Labs  10/21/12 1933 10/22/12 0034 10/22/12 0400  GLUCAP 106* 103* 103*     Assessment/Plan: S/P Procedure(s) (LRB): CORONARY ARTERY BYPASS GRAFTING (CABG) (N/A) INTRAOPERATIVE TRANSESOPHAGEAL ECHOCARDIOGRAM (N/A) -POD # 2 CABG  CV- stable, troponins trending down- c/w post CABG, I think his pain was probably incisional in natyure but will observe closely  RESP- pulmonary hygiene  RENAL- diurese  Dc foley  Ambulate  Keep in ICU today to mobilize, observe for further CP   LOS: 4 days    HENDRICKSON,STEVEN C 10/22/2012

## 2012-10-23 LAB — GLUCOSE, CAPILLARY
Glucose-Capillary: 81 mg/dL (ref 70–99)
Glucose-Capillary: 88 mg/dL (ref 70–99)
Glucose-Capillary: 92 mg/dL (ref 70–99)

## 2012-10-23 LAB — CBC
Hemoglobin: 11.9 g/dL — ABNORMAL LOW (ref 13.0–17.0)
RBC: 3.76 MIL/uL — ABNORMAL LOW (ref 4.22–5.81)

## 2012-10-23 LAB — BASIC METABOLIC PANEL
GFR calc Af Amer: >90 mL/min (ref >90)
GFR calc non Af Amer: 85 mL/min — ABNORMAL LOW (ref >90)
Potassium: 4.3 mEq/L (ref 3.5–5.1)
Sodium: 135 mEq/L (ref 135–145)

## 2012-10-23 MED ORDER — POTASSIUM CHLORIDE CRYS ER 20 MEQ PO TBCR
20.0000 meq | EXTENDED_RELEASE_TABLET | Freq: Every day | ORAL | Status: DC
  Administered 2012-10-23 – 2012-10-25 (×3): 20 meq via ORAL
  Filled 2012-10-23 (×3): qty 1

## 2012-10-23 MED ORDER — SODIUM CHLORIDE 0.9 % IJ SOLN: 3.0000 mL | INTRAMUSCULAR | Status: DC | PRN

## 2012-10-23 MED ORDER — SODIUM CHLORIDE 0.9 % IV SOLN: 250.0000 mL | INTRAVENOUS | Status: DC | PRN

## 2012-10-23 MED ORDER — ZOLPIDEM TARTRATE 5 MG PO TABS: 5.0000 mg | ORAL_TABLET | Freq: Every evening | ORAL | Status: DC | PRN

## 2012-10-23 MED ORDER — GUAIFENESIN-DM 100-10 MG/5ML PO SYRP: 15.0000 mL | ORAL_SOLUTION | ORAL | Status: DC | PRN

## 2012-10-23 MED ORDER — MOVING RIGHT ALONG BOOK
Freq: Once | Status: AC
  Administered 2012-10-23: 16:00:00
  Filled 2012-10-23: qty 1

## 2012-10-23 MED ORDER — SODIUM CHLORIDE 0.9 % IJ SOLN
3.0000 mL | Freq: Two times a day (BID) | INTRAMUSCULAR | Status: DC
  Administered 2012-10-23 – 2012-10-24 (×3): 3 mL via INTRAVENOUS

## 2012-10-23 NOTE — Progress Notes (Signed)
3 Days Post-Op Procedure(s) (LRB): CORONARY ARTERY BYPASS GRAFTING (CABG) (N/A) INTRAOPERATIVE TRANSESOPHAGEAL ECHOCARDIOGRAM (N/A) Subjective: No complaints this AM   Objective: Vital signs in last 24 hours: Temp:  [97.4 F (36.3 C)-99 F (37.2 C)] 97.4 F (36.3 C) (04/13 0818) Pulse Rate:  [62-85] 72 (04/13 0804) Cardiac Rhythm:  [-] Normal sinus rhythm (04/13 0745) Resp:  [9-33] 21 (04/13 0804) BP: (103-135)/(47-90) 106/90 mmHg (04/13 0804) SpO2:  [90 %-100 %] 98 % (04/13 0804) Weight:  [222 lb 4.8 oz (100.835 kg)] 222 lb 4.8 oz (100.835 kg) (04/13 0600)  Hemodynamic parameters for last 24 hours:    Intake/Output from previous day: 04/12 0701 - 04/13 0700 In: 400 [P.O.:240; I.V.:160] Out: 1505 [Urine:1505] Intake/Output this shift:    General appearance: alert and no distress Neurologic: intact Heart: regular rate and rhythm Lungs: diminished breath sounds left base Wound: clean and dry  Lab Results:  Recent Labs  10/22/12 0350 10/23/12 0548  WBC 19.3* 15.3*  HGB 12.1* 11.9*  HCT 34.8* 34.1*  PLT 109* 129*   BMET:  Recent Labs  10/22/12 0350 10/23/12 0548  NA 134* 135  K 4.6 4.3  CL 101 99  CO2 28 30  GLUCOSE 110* 92  BUN 21 23  CREATININE 1.19 0.90  CALCIUM 8.8 8.9    PT/INR:  Recent Labs  10/20/12 1343  LABPROT 15.9*  INR 1.30   ABG    Component Value Date/Time   PHART 7.265* 10/21/2012 1021   HCO3 24.5* 10/21/2012 1021   TCO2 27 10/21/2012 1750   ACIDBASEDEF 3.0* 10/21/2012 1021   O2SAT 90.0 10/21/2012 1021   CBG (last 3)   Recent Labs  10/22/12 2005 10/23/12 10/23/12 0427  GLUCAP 108* 88 92    Assessment/Plan: S/P Procedure(s) (LRB): CORONARY ARTERY BYPASS GRAFTING (CABG) (N/A) INTRAOPERATIVE TRANSESOPHAGEAL ECHOCARDIOGRAM (N/A) Plan for transfer to step-down: see transfer orders POD # 3 CABG  CV- stable  RESP- pulmonary hygiene  RENAL- lytes and creatinine OK  CBG well controlled  Continue ambulation   LOS: 5  days    Candee Hoon C 10/23/2012

## 2012-10-23 NOTE — Plan of Care (Signed)
Problem: Phase III Progression Outcomes Goal: Time patient transferred to PCTU/Telemetry POD Outcome: Completed/Met Date Met:  10/23/12 1015am-pt ambulated to 2035, VS stable prior and during the transfer. Pt only belongings are eye-glasses and bilateral dentures which patient is wearing all of them. Pt family made aware of the transfer. Report given to receiving RN, Lyne.

## 2012-10-24 ENCOUNTER — Inpatient Hospital Stay (HOSPITAL_COMMUNITY): Payer: Medicare Other

## 2012-10-24 LAB — CBC
HCT: 33.8 % — ABNORMAL LOW (ref 39.0–52.0)
Hemoglobin: 11.5 g/dL — ABNORMAL LOW (ref 13.0–17.0)
MCH: 30.7 pg (ref 26.0–34.0)
MCHC: 34 g/dL (ref 30.0–36.0)
MCV: 90.4 fL (ref 78.0–100.0)
Platelets: 179 K/uL (ref 150–400)
RBC: 3.74 MIL/uL — ABNORMAL LOW (ref 4.22–5.81)
RDW: 12.6 % (ref 11.5–15.5)
WBC: 11.9 K/uL — ABNORMAL HIGH (ref 4.0–10.5)

## 2012-10-24 LAB — BASIC METABOLIC PANEL
CO2: 28 mEq/L (ref 19–32)
Calcium: 8.8 mg/dL (ref 8.4–10.5)
Glucose, Bld: 101 mg/dL — ABNORMAL HIGH (ref 70–99)
Potassium: 3.7 mEq/L (ref 3.5–5.1)
Sodium: 138 mEq/L (ref 135–145)

## 2012-10-24 MED ORDER — METOPROLOL TARTRATE 25 MG PO TABS
25.0000 mg | ORAL_TABLET | Freq: Two times a day (BID) | ORAL | Status: DC
  Administered 2012-10-24 – 2012-10-25 (×3): 25 mg via ORAL
  Filled 2012-10-24 (×4): qty 1

## 2012-10-24 NOTE — Progress Notes (Signed)
CARDIAC REHAB PHASE I   PRE:  Rate/Rhythm: 86 SR  BP:  Supine:   Sitting: 152/78  Standing:    SaO2: 86% inc 92% RA  MODE:  Ambulation: 330 ft   POST:  Rate/Rhythm: 93  BP:  Supine:   Sitting: 152/78  Standing:    SaO2: 80% mid-way through walk inc to 91% RA   1029-1058- Pt ambulated 330 ft with assist x 1 and pushing rolling walker. Pt's SaO2 dropped to 80% RA mid-way through ambulation, pt c/o "normal" SOB. SaO2 increased slowly to 91% with rest and pursed-lip breathing. Encouraged IS use. I informed pt's RN of O2 desaturation with exertion, pt was put on 2L O2 back in room.   Annetta Maw

## 2012-10-24 NOTE — Progress Notes (Signed)
[facility]  Subjective: Feeling great. No further CP. He has been ambulating the hall without difficulty.   Objective: Vital signs in last 24 hours: Temp:  [97.6 F (36.4 C)-98.9 F (37.2 C)] 98.2 F (36.8 C) (04/14 0442) Pulse Rate:  [78-86] 82 (04/14 0442) Resp:  [19-22] 19 (04/14 0442) BP: (114-128)/(46-85) 128/85 mmHg (04/14 0442) SpO2:  [90 %-92 %] 91 % (04/14 0442) Weight:  [218 lb 8 oz (99.111 kg)] 218 lb 8 oz (99.111 kg) (04/14 0442) Last BM Date: 10/19/12  Intake/Output from previous day: 04/13 0701 - 04/14 0700 In: 840 [P.O.:840] Out: 1650 [Urine:1650] Intake/Output this shift:    Medications Current Facility-Administered Medications  Medication Dose Route Frequency Provider Last Rate Last Dose  . 0.9 %  sodium chloride infusion  250 mL Intravenous PRN Loreli Slot, MD      . acetaminophen (TYLENOL) tablet 1,000 mg  1,000 mg Oral Q6H Purcell Nails, MD   1,000 mg at 10/24/12 0538  . albuterol (PROVENTIL) (5 MG/ML) 0.5% nebulizer solution 2.5 mg  2.5 mg Nebulization Q4H PRN Purcell Nails, MD   2.5 mg at 10/21/12 1944  . aspirin EC tablet 325 mg  325 mg Oral Daily Purcell Nails, MD   325 mg at 10/24/12 0929  . atorvastatin (LIPITOR) tablet 20 mg  20 mg Oral q1800 Purcell Nails, MD   20 mg at 10/23/12 1708  . bisacodyl (DULCOLAX) EC tablet 10 mg  10 mg Oral Daily Purcell Nails, MD   10 mg at 10/24/12 1610   Or  . bisacodyl (DULCOLAX) suppository 10 mg  10 mg Rectal Daily Purcell Nails, MD      . docusate sodium (COLACE) capsule 200 mg  200 mg Oral Daily Purcell Nails, MD   200 mg at 10/24/12 0929  . furosemide (LASIX) tablet 40 mg  40 mg Oral Daily Loreli Slot, MD   40 mg at 10/24/12 0929  . guaiFENesin-dextromethorphan (ROBITUSSIN DM) 100-10 MG/5ML syrup 15 mL  15 mL Oral Q4H PRN Loreli Slot, MD      . ipratropium (ATROVENT) nebulizer solution 0.5 mg  0.5 mg Nebulization Q6H PRN Purcell Nails, MD       . metoprolol tartrate (LOPRESSOR) tablet 25 mg  25 mg Oral BID Erin Barrett, PA-C   25 mg at 10/24/12 0930  . ondansetron (ZOFRAN) injection 4 mg  4 mg Intravenous Q6H PRN Purcell Nails, MD      . oxyCODONE (Oxy IR/ROXICODONE) immediate release tablet 5-10 mg  5-10 mg Oral Q3H PRN Purcell Nails, MD   5 mg at 10/23/12 0539  . pantoprazole (PROTONIX) EC tablet 40 mg  40 mg Oral Daily Purcell Nails, MD   40 mg at 10/24/12 0930  . potassium chloride SA (K-DUR,KLOR-CON) CR tablet 20 mEq  20 mEq Oral Daily Loreli Slot, MD   20 mEq at 10/24/12 0929  . sodium chloride 0.9 % injection 3 mL  3 mL Intravenous Q12H Loreli Slot, MD   3 mL at 10/24/12 0930  . sodium chloride 0.9 % injection 3 mL  3 mL Intravenous PRN Loreli Slot, MD      . zolpidem [facility]) tablet 5 mg  5 mg Oral QHS PRN Loreli Slot, MD        PE: General appearance: alert, cooperative and no distress Lungs: mild bibasilar crackles Heart: regular rate and rhythm Extremities: no LEE  Pulses: 2+ and symmetric Skin: warm and dry Neurologic: Grossly normal  Lab Results:   Recent Labs  10/22/12 0350 10/23/12 0548 10/24/12 0536  WBC 19.3* 15.3* 11.9*  HGB 12.1* 11.9* 11.5*  HCT 34.8* 34.1* 33.8*  PLT 109* 129* 179   BMET  Recent Labs  10/22/12 0350 10/23/12 0548 10/24/12 0536  NA 134* 135 138  K 4.6 4.3 3.7  CL 101 99 100  CO2 28 30 28   GLUCOSE 110* 92 101*  BUN 21 23 24*  CREATININE 1.19 0.90 0.84  CALCIUM 8.8 8.9 8.8    Assessment/Plan    Principal Problem:   S/P CABG x 3 Active Problems:   Unstable angina   HTN (hypertension)   Dyslipidemia   CAD (coronary artery disease), significant requiring CABG per cath 10/19/12   Tobacco use disorder   COPD, severe  Plan: S/P CABG x 3 (LIMA to LAD, SVG to PDA, SVG to OM of LCx). POD#4. Normal progression. No further chest pain. No post-operative a-fib. Maintaining NSR. HR in the 80s. BP is stable, with most recent being  128/85. He is on BB and statin. Consider adding/restarting an ACE-I/ ARB. Renal function is stable with SCr. of 0.84. He was on 20 mg of quinapril prior to admission. Pt may possibly go home tomorrow. Will arrange OP follow- up with Dr. Rennis Golden in [place].      LOS: 6 days    [name] 10/24/2012 9:46 AM  Agree with note written by [name]  [facility]  Looks good. POD # 4 CABG. Desats with ambulation. Labs OK. Exam benign. Prob home tomorrow per TCTS. ROV with Dr. Rennis Golden 2-3 weeks.   [name] 10/24/2012 4:22 PM

## 2012-10-24 NOTE — Progress Notes (Addendum)
4 Days Post-Op Procedure(s) (LRB): CORONARY ARTERY BYPASS GRAFTING (CABG) (N/A) INTRAOPERATIVE TRANSESOPHAGEAL ECHOCARDIOGRAM (N/A) Subjective:  [name] is without complaints this morning.  States he feels pretty good.  +BM  Objective: Vital signs in last 24 hours: Temp:  [97.4 F (36.3 C)-98.9 F (37.2 C)] 98.2 F (36.8 C) (04/14 0442) Pulse Rate:  [78-233] 82 (04/14 0442) Cardiac Rhythm:  [-] Normal sinus rhythm (04/13 2009) Resp:  [19-22] 19 (04/14 0442) BP: (114-157)/(46-85) 128/85 mmHg (04/14 0442) SpO2:  [90 %-93 %] 91 % (04/14 0442) Weight:  [218 lb 8 oz (99.111 kg)] 218 lb 8 oz (99.111 kg) (04/14 0442)  Intake/Output from previous day: 04/13 0701 - 04/14 0700 In: 600 [P.O.:600] Out: 1650 [Urine:1650]  General appearance: alert, cooperative and no distress Heart: regular rate and rhythm Lungs: clear to auscultation bilaterally Abdomen: soft, non-tender; bowel sounds normal; no masses,  no organomegaly Extremities: edema trace Wound: clean and dry  Lab Results:  Recent Labs  10/23/12 0548 10/24/12 0536  WBC 15.3* 11.9*  HGB 11.9* 11.5*  HCT 34.1* 33.8*  PLT 129* 179   BMET:  Recent Labs  10/23/12 0548 10/24/12 0536  NA 135 138  K 4.3 3.7  CL 99 100  CO2 30 28  GLUCOSE 92 101*  BUN 23 24*  CREATININE 0.90 0.84  CALCIUM 8.9 8.8    PT/INR: No results found for this basename: LABPROT, INR,  in the last 72 hours ABG    Component Value Date/Time   PHART 7.265* 10/21/2012 1021   HCO3 24.5* 10/21/2012 1021   TCO2 27 10/21/2012 1750   ACIDBASEDEF 3.0* 10/21/2012 1021   O2SAT 90.0 10/21/2012 1021   CBG (last 3)   Recent Labs  10/23/12 10/23/12 0427 10/23/12 0816  GLUCAP 88 92 81    Assessment/Plan: S/P Procedure(s) (LRB): CORONARY ARTERY BYPASS GRAFTING (CABG) (N/A) INTRAOPERATIVE TRANSESOPHAGEAL ECHOCARDIOGRAM (N/A)  1. CV- NSR rate in the 80s, pressure mildly elevated will increase Lopressor to 25 mg BID 2. Pulm- no acute issues, continue  IS 3. Renal- creatinine WNL, lytes ok, volume overloaded, continue diuresis 4. Expected blood loss anemia- mild hgb 11.5 5. Dispo- patient doing well, will d/c EPW, titrate Lopressor, likely d/c in AM   LOS: 6 days    Raford Pitcher, ERIN 10/24/2012  Patient seen and examined. Agree with above. Progressing well

## 2012-10-24 NOTE — Care Management Note (Signed)
    Page 1 of 1   10/25/2012     5:04:46 PM   CARE MANAGEMENT NOTE 10/25/2012  Patient:  [name], [name]   Account Number:  [id number]  Date Initiated:  10/20/2012  Documentation initiated by:  MAYO,HENRIETTA  Subjective/Objective Assessment:   67 yr-old male adm to SICU s/p CABG x 3; lives with spouse; independent PTA     Action/Plan:   WILL FOLLOW FOR HOME NEEDS AS PT PROGRESSES   Anticipated DC Date:  10/25/2012   Anticipated DC Plan:  HOME W HOME HEALTH SERVICES      DC Planning Services  CM consult      Choice offered to / List presented to:     DME arranged  [facility]      DME agency  [facility]        Status of service:  Completed, signed off Medicare Important Message given?   (If response is "NO", the following Medicare IM given date fields will be blank) Date Medicare IM given:   Date Additional Medicare IM given:    Discharge Disposition:  HOME/SELF CARE  Per UR Regulation:  Reviewed for med. necessity/level of care/duration of stay  If discussed at Long Length of Stay Meetings, dates discussed:    Comments:  10/25/12 Thedora Rings,RN,BSN [phone] PT DESATS TO 80% WITH AMBULATION.  WILL  NEED HOME OXYGEN. ORDER OBTAINED FROM PA.  REFERRAL TO [facility] FOR DME NEEDS. PORTABLE TANK DELIVERED TO PT PRIOR TO DC.

## 2012-10-24 NOTE — Discharge Summary (Signed)
[address]            [place] [address]          [phone]         Discharge Summary  Name: [name] DOB: July 10, 1946 67 y.o. MRN: [id number]   Admission Date: 10/18/2012 Discharge Date:     Admitting Diagnosis: Chest pain   Discharge Diagnosis:  Severe 3 vessel coronary artery disease Expected postoperative blood loss anemia  Past Medical History  Diagnosis Date  . Coronary artery disease   . Hypertension   . Dyslipidemia 10/18/2012  . 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 male with a known history of coronary artery disease, hypertension, hyperlipidemia, and long-standing tobacco abuse. The patient underwent cardiac catheterization in 2004 with symptoms of stable angina, and was found to have chronic occlusion of the right coronary artery with left-to-right collaterals and insignificant disease in the left coronary system. He was treated medically. He has done well until approximately 3 or 4 weeks ago when he first began to develop classical symptoms of exertional angina. At the time, he was helping a friend frame a house and he began to experience some substernal chest pain radiating to the jaw that was usually promptly relieved by either rest or sublingual nitroglycerin. Over the last 3 weeks, symptoms continued to accelerate until the day prior to admission,  when the patient developed a more severe episode of pain while walking a short distance. The pain again was relieved by sublingual nitroglycerin. He subsequently presented to the emergency department at [facility]  where an EKG was performed demonstrating subtle ST segment depression in the anterolateral leads. Troponin levels remained negative. He was transferred to [facility] for further cardiac evaluation.    Hospital Course:  The patient was admitted to [facility] on 10/18/2012.  He underwent diagnostic catheterization on 10/19/2012 by Dr. Rennis Golden which showed left main and severe three vessel coronary artery disease with preserved left ventricular function.  A cardiac surgery consult was requested and Dr. Cornelius Moras saw the patient.  It was felt that he would benefit from surgical revascularization.  All risks, benefits and alternatives of surgery were explained in detail, and the patient agreed to proceed.   The patient was taken to the operating room and underwent the above procedure.    The postoperative course has generally been uneventful. He has been mildly hypertensive, and has been restarted on a beta blocker and an ACE-inhibitor.  Lasix was started for volume overload, and he is diuresing well.  He has had an expected postop blood loss anemia, but has not required a transfusion.  He is ambulating in the hall without difficulty, and is tolerating a regular diet.  He is stable and ready for discharge home on today's date.    Recent vital signs:  Filed Vitals:   10/24/12 0442  BP: 128/85  Pulse: 82  Temp: 98.2 F (36.8 C)  Resp: 19    Recent laboratory studies:  CBC: Recent Labs  10/23/12 0548 10/24/12 0536  WBC 15.3* 11.9*  HGB 11.9* 11.5*  HCT 34.1* 33.8*  PLT 129* 179   BMET:  Recent Labs  10/23/12 0548 10/24/12 0536  NA 135 138  K 4.3 3.7  CL 99 100  CO2 30 28  GLUCOSE 92 101*  BUN 23 24*  CREATININE 0.90 0.84  CALCIUM 8.9 8.8    PT/INR: No results found for this basename: LABPROT, INR,  in the last 72 hours   Discharge Medications:     Medication List    STOP taking these medications       amLODipine 5 MG tablet  Commonly known as:  NORVASC     aspirin 81 MG chewable  tablet     isosorbide mononitrate 30 MG 24 hr tablet  Commonly known as:  IMDUR     nitroGLYCERIN 0.4 MG SL tablet  Commonly known as:  NITROSTAT      TAKE these medications       aspirin 325 MG EC tablet  Take 1 tablet (325 mg total) by mouth daily.     atorvastatin 20 MG tablet  Commonly known as:  LIPITOR  Take 20 mg by mouth daily.     furosemide 40 MG tablet  Commonly known as:  LASIX  Take 1 tablet (40 mg total) by mouth daily. X 1 week     metoprolol tartrate 25 MG tablet  Commonly known as:  LOPRESSOR  Take 1 tablet (25 mg total) by mouth 2 (two) times daily.     oxyCODONE 5 MG immediate release tablet  Commonly known as:  Oxy IR/ROXICODONE  Take 1-2 tablets (5-10 mg total) by mouth every 3 (three) hours as needed for pain.     potassium chloride SA 20 MEQ tablet  Commonly known as:  K-DUR,KLOR-CON  Take 1 tablet (20 mEq total) by mouth daily. X 1 week     quinapril 20 MG tablet  Commonly known as:  ACCUPRIL  Take 20 mg by mouth at bedtime.          Discharge Instructions:  The patient is to refrain from driving, heavy lifting or strenuous activity.  May shower daily and clean incisions with soap and water.  May resume regular diet.   Follow Up:    Follow-up Information   Follow up with Purcell Nails, MD In 3 weeks. (Office will call to schedule appointment)    Contact information:   [address] [employer] [place] [address] [phone]       Follow up with Chrystie Nose, MD. Schedule an appointment as soon as possible for a visit in 2 weeks.   Contact information:   [address] [place] [address] [phone]          Almarosa Bohac H 10/24/2012, 11:18 AM

## 2012-10-25 MED ORDER — POTASSIUM CHLORIDE CRYS ER 20 MEQ PO TBCR: 20.0000 meq | EXTENDED_RELEASE_TABLET | Freq: Every day | ORAL | Status: DC

## 2012-10-25 MED ORDER — OXYCODONE HCL 5 MG PO TABS: 5.0000 mg | ORAL_TABLET | ORAL | Status: DC | PRN

## 2012-10-25 MED ORDER — ASPIRIN 325 MG PO TBEC: 325.0000 mg | DELAYED_RELEASE_TABLET | Freq: Every day | ORAL | Status: DC

## 2012-10-25 MED ORDER — METOPROLOL TARTRATE 25 MG PO TABS: 25.0000 mg | ORAL_TABLET | Freq: Two times a day (BID) | ORAL | Status: DC

## 2012-10-25 MED ORDER — LISINOPRIL 20 MG PO TABS
20.0000 mg | ORAL_TABLET | Freq: Every day | ORAL | Status: DC
Start: 2012-10-25 — End: 2012-10-25
  Filled 2012-10-25: qty 1

## 2012-10-25 MED ORDER — FUROSEMIDE 40 MG PO TABS: 40.0000 mg | ORAL_TABLET | Freq: Every day | ORAL | Status: DC

## 2012-10-25 NOTE — Progress Notes (Signed)
                    [address]            [name] [address]          [phone]     5 Days Post-Op Procedure(s) (LRB): CORONARY ARTERY BYPASS GRAFTING (CABG) (N/A) INTRAOPERATIVE TRANSESOPHAGEAL ECHOCARDIOGRAM (N/A)  Subjective: Feels well, no complaints.  Wants to go home.   Objective: Vital signs in last 24 hours: Patient Vitals for the past 24 hrs:  BP Temp Temp src Pulse Resp SpO2 Weight  10/25/12 0513 146/67 mmHg 98.3 F (36.8 C) Oral 78 17 91 % -  10/25/12 0500 - - - - - - 213 lb 12.8 oz (96.979 kg)  10/24/12 2141 138/66 mmHg 97.8 F (36.6 C) Oral 80 18 92 % -  10/24/12 1630 148/64 mmHg - - 88 - - -  10/24/12 1600 140/64 mmHg - - 82 - - -  10/24/12 1532 - 98.1 F (36.7 C) - - - - -  10/24/12 1530 129/66 mmHg - - 76 - - -  10/24/12 1500 129/57 mmHg - - 80 - - -  10/24/12 1430 137/52 mmHg - - 78 - - -  10/24/12 1400 126/55 mmHg - - - - - -  10/24/12 1345 123/55 mmHg - - 80 - - -  10/24/12 1330 140/54 mmHg - - - 18 - -  10/24/12 1320 144/48 mmHg - - - - - -   Current Weight  10/25/12 213 lb 12.8 oz (96.979 kg)     Intake/Output from previous day: 04/14 0701 - 04/15 0700 In: -  Out: 800 [Urine:800]    PHYSICAL EXAM:  Heart: RRR Lungs: Clear Wound: Clean and dry Extremities: Mild LE edema    Lab Results: CBC: Recent Labs  10/23/12 0548 10/24/12 0536  WBC 15.3* 11.9*  HGB 11.9* 11.5*  HCT 34.1* 33.8*  PLT 129* 179   BMET:  Recent Labs  10/23/12 0548 10/24/12 0536  NA 135 138  K 4.3 3.7  CL 99 100  CO2 30 28  GLUCOSE 92 101*  BUN 23 24*  CREATININE 0.90 0.84  CALCIUM 8.9 8.8    PT/INR: No results found for this basename: LABPROT, INR,  in the last 72 hours    Assessment/Plan: S/P Procedure(s) (LRB): CORONARY ARTERY BYPASS GRAFTING (CABG) (N/A) INTRAOPERATIVE TRANSESOPHAGEAL ECHOCARDIOGRAM (N/A) CV- BPs remain elevated.  Will continue Lopressor and resume home ACE-I. Vol overload- diurese. Hopefully home  later this am- instructions reviewed with patient.   LOS: 7 days    [name] H 10/25/2012

## 2012-10-25 NOTE — Progress Notes (Addendum)
CARDIAC REHAB PHASE I   PRE:  Rate/Rhythm: 84 SR  BP:  Supine:   Sitting: 136/70  Standing:    SaO2: 88-89 RA MODE:  Ambulation: 500   POST:  Rate/Rhythm: 85 BP:  Supine:   Sitting: 150/72ding:    SaO2: 80 RA during walk O22L 91% 0935-1045 On arrival pt on RA sat 88-89%. Walked on room air sat down to 80%, applied O2 improved to 91%. Pt without c/o when RA sat down to 80%. Pt used walker to ambulate, stats that he has one at home he can use. O2 sat after walk 90% on 2L. Completed discharge education with pt and wife. They voice understanding. Pt agrees to Outpt. [facility] in [place] will send referral. Discussed smoking cessation with pt. I gave him tips for quitting and coaching contact number information. Pt states that he has quit, seems very motivated to quitting. Melina Copa RN 10/25/2012 10:41 AM

## 2012-10-25 NOTE — Progress Notes (Signed)
CTS removed per MD order and protocol. Sterri strips applied.

## 2012-10-25 NOTE — Progress Notes (Signed)
Pt discharged per MD order and protocol. All discharge instructions and prescriptions given to patient and reviewed, all questions answered. Pt aware of all follow up appointments.

## 2012-10-25 NOTE — Progress Notes (Signed)
Removed epicardial wires (three intact). No ectopy.  Pt instructed to remain on bedrest for one hour.  Frequent vital signs taken and documented. Pt has family at bedside and call bell within reach.  Will continue to monitor. Thomas Hoff

## 2012-10-25 NOTE — Progress Notes (Signed)
SATURATION QUALIFICATIONS: (This note is used to comply with regulatory documentation for home oxygen)  Patient Saturations on Room Air at Rest = 88-89%  Patient Saturations on Room Air while Ambulating = 80%  Patient Saturations on 2 Liters of oxygen while Ambulating = 91%  Please briefly explain why patient needs home oxygen:Pt desats to 80 on RA when ambulating. Melina Copa RN

## 2012-10-25 NOTE — Progress Notes (Signed)
Subjective: No complaints plan to be discharged today.  + PVCs    Objective: Vital signs in last 24 hours: Temp:  [97.8 F (36.6 C)-98.3 F (36.8 C)] 98.3 F (36.8 C) (04/15 0513) Pulse Rate:  [76-88] 78 (04/15 0513) Resp:  [17-18] 17 (04/15 0513) BP: (123-148)/(48-67) 146/67 mmHg (04/15 0513) SpO2:  [91 %-92 %] 91 % (04/15 0513) Weight:  [96.979 kg (213 lb 12.8 oz)] 96.979 kg (213 lb 12.8 oz) (04/15 0500) Weight change: -2.132 kg (-4 lb 11.2 oz) Last BM Date: 10/24/12 Intake/Output from previous day: 04/14 0701 - 04/15 0700 In: -  Out: 800 [Urine:800] Intake/Output this shift: Total I/O In: 240 [P.O.:240] Out: -   PE: General:alert and oriented Heart:S1S2 RRR Lungs:few wheezes Abd: soft, NT, ND, NABS, no HSM Extr: mild edema   Lab Results:  Recent Labs  10/23/12 0548 10/24/12 0536  WBC 15.3* 11.9*  HGB 11.9* 11.5*  HCT 34.1* 33.8*  PLT 129* 179   BMET  Recent Labs  10/23/12 0548 10/24/12 0536  NA 135 138  K 4.3 3.7  CL 99 100  CO2 30 28  GLUCOSE 92 101*  BUN 23 24*  CREATININE 0.90 0.84  CALCIUM 8.9 8.8   No results found for this basename: TROPONINI, CK, MB,  in the last 72 hours  Lab Results  Component Value Date   CHOL 117 10/19/2012   HDL 33* 10/19/2012   LDLCALC 67 10/19/2012   TRIG 83 10/19/2012   CHOLHDL 3.5 10/19/2012   Lab Results  Component Value Date   HGBA1C 5.5 10/18/2012     Lab Results  Component Value Date   TSH 0.985 10/18/2012        Studies/Results: Dg Chest 2 View  10/24/2012  *RADIOLOGY REPORT*  Clinical Data: Status post coronary bypass grafting  CHEST - 2 VIEW  Comparison: 10/22/2012  Findings: Small bilateral pleural effusions are noted.  No focal confluent infiltrate is seen.  Postsurgical changes are noted.  No pneumothorax is noted.  Cardiac shadow is stable.  IMPRESSION: Small bilateral pleural effusions.  No confluent infiltrate is seen.   Original Report Authenticated By: Alcide Clever, M.D.     Medications: I have  reviewed the patient's current medications. Scheduled Meds: . acetaminophen  1,000 mg Oral Q6H  . aspirin EC  325 mg Oral Daily  . atorvastatin  20 mg Oral q1800  . bisacodyl  10 mg Oral Daily   Or  . bisacodyl  10 mg Rectal Daily  . docusate sodium  200 mg Oral Daily  . furosemide  40 mg Oral Daily  . lisinopril  20 mg Oral Daily  . metoprolol tartrate  25 mg Oral BID  . pantoprazole  40 mg Oral Daily  . potassium chloride  20 mEq Oral Daily  . sodium chloride  3 mL Intravenous Q12H   Continuous Infusions:  PRN Meds:.sodium chloride, albuterol, guaiFENesin-dextromethorphan, ipratropium, ondansetron (ZOFRAN) IV, oxyCODONE, sodium chloride, zolpidem  Assessment/Plan: Principal Problem:   S/P CABG x 3 Active Problems:   Unstable angina   HTN (hypertension)   Dyslipidemia   CAD (coronary artery disease), significant requiring CABG per cath 10/19/12   Tobacco use disorder   COPD, severe  PLAN: sm bil effusions on xray.  K= 3.7, to follow up with Dr. Rennis Golden in [place] 4/22.   LOS: 7 days   Time spent with pt. :10 minutes. INGOLD,LAURA R 10/25/2012, 11:06 AM  I have seen and evaluated the patient this AM along with  Vernona Rieger  Annie Paras, NP. I agree with her findings, examination as well as impression recommendations.  Seems to be doing relatively well post-op.  Less dyspnea with ambulation. Continues to use Incentive Spirometry.  BP & HR remain stable.   Anticipate d/c today -- ROV with Dr. Rennis Golden arranged.   Marykay Lex, M.D., M.S. [facility] [address] [place], [place]  [address]  [phone] Pager # [phone] 10/25/2012 1:04 PM

## 2012-11-01 NOTE — Clinical Documentation Improvement (Signed)
RESPIRATORY FAILURE DOCUMENTATION CLARIFICATION QUERY   THIS DOCUMENT IS NOT A PERMANENT PART OF THE MEDICAL RECORD  TO RESPOND TO THE THIS QUERY, FOLLOW THE INSTRUCTIONS BELOW:  1. If needed, update documentation for the patient's encounter via the notes activity.  2. Access this query again and click edit on the In [employer].  3. After updating, or not, click F2 to complete all highlighted (required) fields concerning your review. Select "additional documentation in the medical record" OR "no additional documentation provided".  4. Click Sign note button.  5. The deficiency will fall out of your In Basket *Please let us know if you are not able to complete this workflow by phone or e-mail (listed below).  Please update your documentation within the medical record to reflect your response to this query.                                                                                    11/01/12  Dear Dr Marton Redwood,  In a better effort to capture your patient's severity of illness, reflect appropriate length of stay and utilization of resources, a review of the patient medical record has revealed the following indicators.    Based on your clinical judgment, please clarify and document in a progress note and/or discharge summary the clinical condition associated with the following supporting information:  In responding to this query please exercise your independent judgment.  The fact that a query is asked, does not imply that any particular answer is desired or expected.  Possible Clinical Conditions?  _______Acute Respiratory Failure _______Acute on Chronic Respiratory Failure _______Chronic Respiratory Failure _______Acute Respiratory Insufficiency _______Acute Respiratory Insufficiency following surgery or trauma _______Other Condition________________ _______Cannot Clinically Determine    Supporting Information:  Risk Factors:  Signs&Symptoms:   Diagnostics: ABG's    PO2      PCO2     PH     BiCarb     Date of ABG's     O2 concentration:     O2 mode: Lab:  Radiology:  Treatment: Oxygen: O2 concentrations:  O2 Mode: ([place], mask, Ventimask, Non rebreather mask, BiPAP, Vent)  Respiratory Treatment:   Treatment: (O2/L/min, Venti mask O2%, Non rebreather mask O2%, BiPAP, Vent)                   You may use possible, probable, or suspect with inpatient documentation. possible, probable, suspected diagnoses MUST be documented at the time of discharge  Reviewed:  no additional documentation provided  Thank You,  Amada Kingfisher  Clinical Documentation Specialist: [phone] Pager  [facility] [place]

## 2012-11-10 ENCOUNTER — Other Ambulatory Visit: Payer: Self-pay | Admitting: *Deleted

## 2012-11-10 DIAGNOSIS — I2510 Atherosclerotic heart disease of native coronary artery without angina pectoris: Secondary | ICD-10-CM

## 2012-11-14 ENCOUNTER — Ambulatory Visit (INDEPENDENT_AMBULATORY_CARE_PROVIDER_SITE_OTHER): Payer: Self-pay | Admitting: Thoracic Surgery (Cardiothoracic Vascular Surgery)

## 2012-11-14 ENCOUNTER — Encounter: Payer: Self-pay | Admitting: Thoracic Surgery (Cardiothoracic Vascular Surgery)

## 2012-11-14 ENCOUNTER — Ambulatory Visit
Admission: RE | Admit: 2012-11-14 | Discharge: 2012-11-14 | Disposition: A | Discharge status: Home / Self Care | Payer: Medicare Other | Source: Ambulatory Visit | Attending: Thoracic Surgery (Cardiothoracic Vascular Surgery) | Admitting: Thoracic Surgery (Cardiothoracic Vascular Surgery)

## 2012-11-14 VITALS — BP 138/73 | HR 62 | Resp 20 | Ht 67.0 in | Wt 212.0 lb

## 2012-11-14 DIAGNOSIS — I2510 Atherosclerotic heart disease of native coronary artery without angina pectoris: Secondary | ICD-10-CM

## 2012-11-14 DIAGNOSIS — Z951 Presence of aortocoronary bypass graft: Secondary | ICD-10-CM

## 2012-11-14 NOTE — Progress Notes (Signed)
[address]            [name] [address]          [phone]     CARDIOTHORACIC SURGERY OFFICE NOTE  Referring Provider is Chrystie Nose., MD PCP is Leanna Sato, MD   HPI:  Patient returns for routine followup status post coronary artery bypass grafting x3 on 10/20/2012. His postoperative recovery has been uncomplicated. Since hospital discharge she has continued to do quite well. He is already been seen in followup by Dr. Rennis Golden and he returns for routine followup to our office today. He reports that he is very mild residual soreness in his chest it really only bothers him if he coughs or sneezes. He has not been taking any sort of pain relievers. He has no shortness of breath and in fact he notes that his breathing is much better than it was prior to surgery.  So far he has continued to abstain from any tobacco use and he is committed to quitting smoking completely. He is no longer using oxygen therapy the he required at the time of hospital discharge. Overall he has no complaints.   Current Outpatient Prescriptions  Medication Sig Dispense Refill  . aspirin EC 325 MG EC tablet Take 1 tablet (325 mg total) by mouth daily.  30 tablet    . atorvastatin (LIPITOR) 20 MG tablet Take 20 mg by mouth daily.      . furosemide (LASIX) 40 MG tablet Take 1 tablet (40 mg total) by mouth daily. X 1 week  7 tablet  0  . metoprolol tartrate (LOPRESSOR) 25 MG tablet Take 1 tablet (25 mg total) by mouth 2 (two) times daily.  60 tablet  1  . oxyCODONE (OXY IR/ROXICODONE) 5 MG immediate release tablet Take 1-2 tablets (5-10 mg total) by mouth every 3 (three) hours as needed for pain.  30 tablet  0  . quinapril (ACCUPRIL) 20 MG tablet Take 20 mg by mouth at bedtime.       No current facility-administered medications for this visit.      Physical Exam:   BP 138/73  Pulse 62  Resp 20  Ht 5\' 7"  (1.702 m)  Wt 212 lb (96.163 kg)  BMI 33.2 kg/m2  SpO2  96%  General:  Well-appearing  Chest:   Clear to auscultation with symmetrical breath sounds  CV:   Regular rate and rhythm without murmur  Incisions:  Clean dry and healing nicely, sternum is stable  Abdomen:  Soft nontender  Extremities:  Warm and well-perfused with no lower extremity edema  Diagnostic Tests:  *RADIOLOGY REPORT*  Clinical Data: Prior CABG. Prior smoker. Coronary artery disease.  COPD.  CHEST - 2 VIEW  Comparison: 10/24/2012  Findings: Prior CABG noted, with near-complete resolution of the  prior pleural effusions. Moderate cardiomegaly noted without  edema. Low lung volumes are present. There is mild airway  thickening.  IMPRESSION:  1. Further reduction and near resolution of the pleural effusions.  2. Moderate cardiomegaly, without edema.  3. Mild airway thickening, query bronchitis or reactive airways  disease.  Original Report Authenticated By: Gaylyn Rong, M.D.    Impression:  Patient is doing very well just 4 weeks status post coronary artery bypass grafting.  Plan:  I've encouraged patient to continue to gradually increase his activity as tolerated but I have reminded him to refrain from any  sort of heavy lifting or strenuous use of his arms or shoulders for at least another 2 months. I've strongly encouraged him to get started the cardiac rehabilitation program and have reminded him how important it will remain for him to continue to be tobacco free for the rest of his life. We have not made any changes in his current medications. We'll see him back in 2 months is to make sure that he continues to recover uneventfully.   Salvatore Decent. Cornelius Moras, MD 11/14/2012 12:01 PM

## 2012-11-14 NOTE — Patient Instructions (Signed)
The patient should continue to avoid any heavy lifting or strenuous use of arms or shoulders for at least a total of three months from the time of surgery.  The patient may return to driving an automobile as long as they are no longer requiring oral narcotic pain relievers during the daytime.  It would be wise to start driving only short distances during the daylight and gradually increase from there as they feel comfortable.

## 2013-01-16 ENCOUNTER — Ambulatory Visit: Payer: Medicare Other | Admitting: Thoracic Surgery (Cardiothoracic Vascular Surgery)

## 2013-01-18 ENCOUNTER — Other Ambulatory Visit: Payer: Self-pay | Admitting: *Deleted

## 2013-01-23 ENCOUNTER — Encounter: Payer: Self-pay | Admitting: Thoracic Surgery (Cardiothoracic Vascular Surgery)

## 2013-01-23 ENCOUNTER — Ambulatory Visit (INDEPENDENT_AMBULATORY_CARE_PROVIDER_SITE_OTHER): Payer: Medicare Other | Admitting: Thoracic Surgery (Cardiothoracic Vascular Surgery)

## 2013-01-23 VITALS — BP 153/86 | HR 68 | Resp 16 | Ht 67.0 in | Wt 212.0 lb

## 2013-01-23 DIAGNOSIS — Z951 Presence of aortocoronary bypass graft: Secondary | ICD-10-CM

## 2013-01-23 DIAGNOSIS — I2510 Atherosclerotic heart disease of native coronary artery without angina pectoris: Secondary | ICD-10-CM

## 2013-01-23 NOTE — Patient Instructions (Signed)
Patient may resume normal physical activity without any particular limitations at this time, and return to our office only as needed should any further problems or questions arise.  

## 2013-01-23 NOTE — Progress Notes (Signed)
      [address]       [name] [address]             [phone]     CARDIOTHORACIC SURGERY OFFICE NOTE  Referring Provider is Chrystie Nose., MD PCP is Leanna Sato, MD   HPI:   Patient returns for routine followup status post coronary artery bypass grafting x3 on 10/20/2012.  He was last seen here in our office on 11/14/2012. Since then he has continued to do well. He reports that he has gone back to work and his exercise tolerance continues to increase. He has minimal residual soreness in his chest. He has no exertional chest pain or shortness of breath. He is not smoking.   Current Outpatient Prescriptions  Medication Sig Dispense Refill  . aspirin EC 325 MG EC tablet Take 1 tablet (325 mg total) by mouth daily.  30 tablet    . atorvastatin (LIPITOR) 20 MG tablet Take 20 mg by mouth daily.      . metoprolol tartrate (LOPRESSOR) 25 MG tablet Take 1 tablet (25 mg total) by mouth 2 (two) times daily.  60 tablet  1  . quinapril (ACCUPRIL) 20 MG tablet Take 20 mg by mouth at bedtime.       No current facility-administered medications for this visit.      Physical Exam:   BP 153/86  Pulse 68  Resp 16  Ht 5\' 7"  (1.702 m)  Wt 96.163 kg (212 lb)  BMI 33.2 kg/m2  SpO2 95%  General:  Well-appearing  Chest:   Clear to auscultation  CV:   Regular rate and rhythm without murmur  Incisions:  Well-healed, sternum is stable  Abdomen:  Soft nontender  Extremities:  Warm and well-perfused  Diagnostic Tests:  n/a   Impression:  Patient is doing well more than 3 months following coronary artery bypass grafting.  Plan:  In the future the patient will call and return to see [place] as needed.   Salvatore Decent. Cornelius Moras, MD 01/23/2013 1:55 PM

## 2013-07-19 ENCOUNTER — Encounter: Payer: Self-pay | Admitting: *Deleted

## 2013-07-21 ENCOUNTER — Encounter: Payer: Self-pay | Admitting: Internal Medicine

## 2013-07-25 ENCOUNTER — Ambulatory Visit (INDEPENDENT_AMBULATORY_CARE_PROVIDER_SITE_OTHER): Payer: Medicare Other | Admitting: Internal Medicine

## 2013-07-25 ENCOUNTER — Encounter: Payer: Self-pay | Admitting: Internal Medicine

## 2013-07-25 VITALS — BP 200/98 | HR 59 | Ht 67.0 in | Wt 220.7 lb

## 2013-07-25 DIAGNOSIS — R0789 Other chest pain: Secondary | ICD-10-CM

## 2013-07-25 DIAGNOSIS — I10 Essential (primary) hypertension: Secondary | ICD-10-CM

## 2013-07-25 DIAGNOSIS — Z951 Presence of aortocoronary bypass graft: Secondary | ICD-10-CM

## 2013-07-25 DIAGNOSIS — E785 Hyperlipidemia, unspecified: Secondary | ICD-10-CM

## 2013-07-25 DIAGNOSIS — R079 Chest pain, unspecified: Secondary | ICD-10-CM

## 2013-07-25 DIAGNOSIS — I2510 Atherosclerotic heart disease of native coronary artery without angina pectoris: Secondary | ICD-10-CM

## 2013-07-25 DIAGNOSIS — J449 Chronic obstructive pulmonary disease, unspecified: Secondary | ICD-10-CM

## 2013-07-25 MED ORDER — AMLODIPINE BESYLATE 10 MG PO TABS: 10.0000 mg | ORAL_TABLET | Freq: Every day | ORAL | Status: DC

## 2013-07-25 MED ORDER — MOMETASONE FUROATE 50 MCG/ACT NA SUSP: 2.0000 | Freq: Every day | NASAL | Status: DC

## 2013-07-25 NOTE — Patient Instructions (Signed)
Dr. Rennis GoldenHilty has ordered a CT [facility] ([address])  Your physician has recommended you make the following change in your medication: START amlodipine (Norvasc) 10mg  once daily.  Dr. Rennis GoldenHilty has prescribed a nasal spray.   Your physician recommends that you schedule a follow-up appointment in: 2-3 weeks.

## 2013-07-25 NOTE — Progress Notes (Signed)
OFFICE NOTE  Chief Complaint:  Chest wall pain, high blood pressure  Primary Care Physician: Leanna Sato, MD  HPI:  [name] is a 68 y.o. male with a known history of coronary artery disease, hypertension, hyperlipidemia, and long-standing tobacco abuse. The patient underwent cardiac catheterization in 2004 with symptoms of stable angina, and was found to have chronic occlusion of the right coronary artery with left-to-right collaterals and insignificant disease in the left coronary system. He was treated medically. He has done well for years, then he began to develop classical symptoms of exertional angina. At the time, he was helping a friend frame a house and he began to experience some substernal chest pain radiating to the jaw that was usually promptly relieved by either rest or sublingual nitroglycerin. Over the last 3 weeks, symptoms continued to accelerate until the day prior to admission, when the patient developed a more severe episode of pain while walking a short distance. The pain again was relieved by sublingual nitroglycerin. He subsequently presented to the emergency department at [facility] where an EKG was performed demonstrating subtle ST segment depression in the anterolateral leads. Troponin levels remained negative. He was transferred to [facility] for further cardiac evaluation. The patient was admitted to [facility] on 10/18/2012. He underwent diagnostic catheterization on 10/19/2012 by Dr. Rennis Golden which showed left main and severe three vessel coronary artery disease with preserved left ventricular function. A cardiac surgery consult was requested and Dr. Cornelius Moras saw the patient. It was felt that he would benefit from surgical revascularization. All risks, benefits and alternatives of surgery were explained in detail, and the patient agreed to proceed.  The patient was taken to the operating room and underwent CABG x 3 (LIMA to LAD, SVG to OM and SVG to PDA. He was noted to  be mildly volume overloaded after discharge and was placed on Lasix and ultimately this was discontinued. He last followed up with Dr. Cornelius Moras and was felt to be stable and followup was requested as needed.    Today he is following up with [place] in the office for the first time and has some complaints about chest wall pain. In fact he is reportedly sore over the midsternal area especially when lifting objects. He pointed out a depression in his midsternum and wonders if this is an area where possibly the sternum did not fully heal. He did report feeling very sore around this area.  In addition he's been having problems with accelerated hypertension, in fact his blood pressure today in the office was 200/98 initially, but did come down to 160/90.  At times he feels like he wakes up at night and feels that his heart is beating harder than typical. He denies any significant chest pain that was similar to before his bypass surgery.  PMHx:  Past Medical History  Diagnosis Date  . Coronary artery disease   . Hypertension   . Dyslipidemia 10/18/2012  . 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 Cornelius Moras)  . Varicose veins     Past Surgical History  Procedure Laterality Date  . Cholecystectomy    . Coronary artery bypass graft N/A 10/20/2012    Procedure: CORONARY ARTERY BYPASS GRAFTING (CABG);  Surgeon: Purcell Nails, MD;  Location: [facility] OR;  Service: Open Heart Surgery;  Laterality: N/A;  . Intraoperative transesophageal echocardiogram N/A 10/20/2012  Procedure: INTRAOPERATIVE TRANSESOPHAGEAL ECHOCARDIOGRAM;  Surgeon: Purcell Nailslarence H [name], MD;  [facility];  Service: Open Heart Surgery;  Laterality: N/A;  . Cardiac catheterization  03/26/2003    occluded RCA with collaterals from L to R, 60% mid Cfx disease, 40% branch disease to OM, LAD with 50% stenosis (Dr. Laurell Josephs. McQueen)   . Shoulder surgery  2002    FAMHx:  Family History    Problem Relation Age of Onset  . Coronary artery disease Mother     SOCHx:   reports that he quit smoking about 9 months ago. His smoking use included Cigarettes. He smoked 2.00 packs per day. He has never used smokeless tobacco. He reports that he does not drink alcohol or use illicit drugs.  ALLERGIES:  No Known Allergies  ROS: A comprehensive review of systems was negative except for: Cardiovascular: positive for dyspnea Musculoskeletal: positive for sternal pain  HOME MEDS: Current Outpatient Prescriptions  Medication Sig Dispense Refill  . aspirin EC 325 MG EC tablet Take 1 tablet (325 mg total) by mouth daily.  30 tablet    . atorvastatin (LIPITOR) 40 MG tablet Take 40 mg by mouth daily.      . metoprolol (LOPRESSOR) 50 MG tablet Take 50 mg by mouth daily.      . quinapril (ACCUPRIL) 20 MG tablet Take 20 mg by mouth at bedtime.      Marland Kitchen. amLODipine (NORVASC) 10 MG tablet Take 1 tablet (10 mg total) by mouth daily.  30 tablet  6  . mometasone (NASONEX) 50 MCG/ACT nasal spray Place 2 sprays into the nose daily.  17 g  0   No current facility-administered medications for this visit.    LABS/IMAGING: No results found for this or any previous visit (from the past 48 hour(s)). No results found.  VITALS: BP 200/98  Pulse 59  Ht 5\' 7"  (1.702 m)  Wt 220 lb 11.2 oz (100.109 kg)  BMI 34.56 kg/m2  EXAM: General appearance: alert and no distress Neck: no carotid bruit and no JVD Lungs: clear to auscultation bilaterally Heart: regular rate and rhythm, S1, S2 normal, no murmur, click, rub or gallop Abdomen: soft, non-tender; bowel sounds normal; no masses,  no organomegaly Extremities: extremities normal, atraumatic, no cyanosis or edema and there is a small 1" long area of the distal sternum which is depressed on exam, no protruding sternal wires were noted Pulses: 2+ and symmetric Skin: Skin color, texture, turgor normal. No rashes or lesions Neurologic: Grossly normal Psych:  Mood, affect normal  EKG: Normal sinus rhythm at 59  ASSESSMENT: 1. Coronary artery disease status post three-vessel CABG in 2014 2. 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 Uncontrolled hypertension  PLAN: 1.   Mr. Katrinka BlazingSmith is complaining of some chest discomfort when lifting objects or carrying items. It is particularly located over a small area of indentation in the sternum. On exam there may be a small area of nonunion. I recommended a noncontrast CT scan to further evaluate and if he is continued to be bothered by this, he may need to be referred back to Dr. Cornelius Moraswen. His blood pressure is the other main issue, noting uncontrolled hypertension. I would like to add Norvasc 10 mg daily and plan to see him back in a few weeks to discuss results of the CT scan as well as to recheck his blood pressure.  Chrystie NoseKenneth C. Hilty, MD, [facility] Attending Cardiologist [facility]  HILTY,Kenneth C 07/25/2013, 5:10 PM

## 2013-07-26 ENCOUNTER — Other Ambulatory Visit: Payer: Medicare Other

## 2013-07-27 ENCOUNTER — Ambulatory Visit
Admission: RE | Admit: 2013-07-27 | Discharge: 2013-07-27 | Disposition: A | Discharge status: Home / Self Care | Payer: Medicare Other | Source: Ambulatory Visit | Attending: Internal Medicine | Admitting: Internal Medicine

## 2013-08-01 ENCOUNTER — Telehealth: Payer: Self-pay | Admitting: *Deleted

## 2013-08-01 DIAGNOSIS — R911 Solitary pulmonary nodule: Secondary | ICD-10-CM

## 2013-08-01 NOTE — Telephone Encounter (Signed)
Message copied by Lindell SparELKINS, Sidda Humm M on Tue Aug 01, 2013  1:03 PM ------      Message from: Chrystie NoseHILTY, KENNETH C      Created: Mon Jul 31, 2013  9:02 AM       Please notify patient that the CT scan showed the sternum was not completely healed, but appears stable. I will forward the results to Dr. Cornelius Moraswen to review.  There was a small pulmonary nodule that is recommended to be followed up in 6 months with a repeat CT.  Please order a repeat follow-up CT chest (non-contrast) for follow-up of lung nodule.  Thanks.            -Dr. Rennis GoldenHilty       ------

## 2013-08-10 ENCOUNTER — Ambulatory Visit (INDEPENDENT_AMBULATORY_CARE_PROVIDER_SITE_OTHER): Payer: Medicare Other | Admitting: Internal Medicine

## 2013-08-10 ENCOUNTER — Encounter: Payer: Self-pay | Admitting: Internal Medicine

## 2013-08-10 VITALS — BP 110/66 | HR 60 | Ht 67.0 in | Wt 222.8 lb

## 2013-08-10 DIAGNOSIS — R911 Solitary pulmonary nodule: Secondary | ICD-10-CM

## 2013-08-10 DIAGNOSIS — I10 Essential (primary) hypertension: Secondary | ICD-10-CM

## 2013-08-10 DIAGNOSIS — R079 Chest pain, unspecified: Secondary | ICD-10-CM

## 2013-08-10 DIAGNOSIS — R0789 Other chest pain: Secondary | ICD-10-CM

## 2013-08-10 DIAGNOSIS — I2510 Atherosclerotic heart disease of native coronary artery without angina pectoris: Secondary | ICD-10-CM

## 2013-08-10 DIAGNOSIS — J449 Chronic obstructive pulmonary disease, unspecified: Secondary | ICD-10-CM

## 2013-08-10 NOTE — Patient Instructions (Signed)
Return in 6 months to discuss CT scan.

## 2013-08-10 NOTE — Progress Notes (Signed)
OFFICE NOTE  Chief Complaint:  Chest wall pain, high blood pressure  Primary Care Physician: Leanna Sato, MD  HPI:  [name] is a 68 y.o. male with a known history of coronary artery disease, hypertension, hyperlipidemia, and long-standing tobacco abuse. The patient underwent cardiac catheterization in 2004 with symptoms of stable angina, and was found to have chronic occlusion of the right coronary artery with left-to-right collaterals and insignificant disease in the left coronary system. He was treated medically. He has done well for years, then he began to develop classical symptoms of exertional angina. At the time, he was helping a friend frame a house and he began to experience some substernal chest pain radiating to the jaw that was usually promptly relieved by either rest or sublingual nitroglycerin. Over the last 3 weeks, symptoms continued to accelerate until the day prior to admission, when the patient developed a more severe episode of pain while walking a short distance. The pain again was relieved by sublingual nitroglycerin. He subsequently presented to the emergency department at [facility] where an EKG was performed demonstrating subtle ST segment depression in the anterolateral leads. Troponin levels remained negative. He was transferred to [facility] for further cardiac evaluation. The patient was admitted to [facility] on 10/18/2012. He underwent diagnostic catheterization on 10/19/2012 by Dr. Rennis Golden which showed left main and severe three vessel coronary artery disease with preserved left ventricular function. A cardiac surgery consult was requested and Dr. Cornelius Moras saw the patient. It was felt that he would benefit from surgical revascularization. All risks, benefits and alternatives of surgery were explained in detail, and the patient agreed to proceed.  The patient was taken to the operating room and underwent CABG x 3 (LIMA to LAD, SVG to OM and SVG to PDA. He was noted to  be mildly volume overloaded after discharge and was placed on Lasix and ultimately this was discontinued. He last followed up with Dr. Cornelius Moras and was felt to be stable and followup was requested as needed.    Today he is following up with [place] in the office for the first time and has some complaints about chest wall pain. In fact he is reportedly sore over the midsternal area especially when lifting objects. He pointed out a depression in his midsternum and wonders if this is an area where possibly the sternum did not fully heal. He did report feeling very sore around this area.  In addition he's been having problems with accelerated hypertension, in fact his blood pressure today in the office was 200/98 initially, but did come down to 160/90.  At times he feels like he wakes up at night and feels that his heart is beating harder than typical. He denies any significant chest pain that was similar to before his bypass surgery.  [name] returns today for followup of his CT scan and for recheck of his blood pressure. Since increasing his amlodipine, his blood pressure is now much better controlled as it was 110/66 today.  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 The results were reviewed by Dr. Cornelius Moras, his surgeon who felt that there was little else to do.  He is not having any significant pain with exercise or heavy lifting.  Unfortunately, there was a small pulmonary nodule and given his history of smoking and COPD, it was recommended that he have a followup CT scan in 6 months.  PMHx:  Past Medical History  Diagnosis Date  . Coronary artery disease   . Hypertension   . Dyslipidemia 10/18/2012  . 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 Cornelius Moraswen)  . Varicose veins     Past Surgical History  Procedure Laterality Date  . Cholecystectomy    . Coronary artery bypass graft N/A 10/20/2012     Procedure: CORONARY ARTERY BYPASS GRAFTING (CABG);  Surgeon: Purcell Nailslarence H [name], MD;  [facility];  Service: Open Heart Surgery;  Laterality: N/A;  . Intraoperative transesophageal echocardiogram N/A 10/20/2012    Procedure: INTRAOPERATIVE TRANSESOPHAGEAL ECHOCARDIOGRAM;  Surgeon: Purcell Nailslarence H [name], MD;  [facility];  Service: Open Heart Surgery;  Laterality: N/A;  . Cardiac catheterization  03/26/2003    occluded RCA with collaterals from L to R, 60% mid Cfx disease, 40% branch disease to OM, LAD with 50% stenosis (Dr. Laurell Josephs. McQueen)   . Shoulder surgery  2002    FAMHx:  Family History  Problem Relation Age of Onset  . Coronary artery disease Mother     SOCHx:   reports that he quit smoking about 9 months ago. His smoking use included Cigarettes. He smoked 2.00 packs per day. He has never used smokeless tobacco. He reports that he does not drink alcohol or use illicit drugs.  ALLERGIES:  No Known Allergies  ROS: A comprehensive review of systems was negative except for: Cardiovascular: positive for dyspnea Musculoskeletal: positive for sternal pain  HOME MEDS: Current Outpatient Prescriptions  Medication Sig Dispense Refill  . amLODipine (NORVASC) 10 MG tablet Take 1 tablet (10 mg total) by mouth daily.  30 tablet  6  . aspirin EC 325 MG EC tablet Take 1 tablet (325 mg total) by mouth daily.  30 tablet    . atorvastatin (LIPITOR) 40 MG tablet Take 40 mg by mouth daily.      . metoprolol (LOPRESSOR) 50 MG tablet Take 50 mg by mouth daily.      . mometasone (NASONEX) 50 MCG/ACT nasal spray Place 2 sprays into the nose daily.  17 g  0  . quinapril (ACCUPRIL) 20 MG tablet Take 20 mg by mouth at bedtime.       No current facility-administered medications for this visit.    LABS/IMAGING: No results found for this or any previous visit (from the past 48 hour(s)). No results found.  VITALS: BP 110/66  Pulse 60  Ht 5\' 7"  (1.702 m)  Wt 222 lb 12.8 oz (101.061 kg)  BMI 34.89  kg/m2  EXAM: deferred  EKG: deferred  ASSESSMENT: 1. Coronary artery disease status post three-vessel CABG in 2014 2. 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 HTN - controlled 4. Solitary pulmonary nodule  PLAN: 1.   Mr. 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 Dr. Cornelius Moraswen does not feel that any further workup is necessary. His blood pressure is much better controlled today with a change in his amlodipine. He did unfortunately have a small solitary pulmonary nodule. I will repeat a CT scan in 6 months and plan to see him back after that.  Chrystie NoseKenneth C. Mari Battaglia, MD, [facility]ACC Attending Cardiologist [facility]  Kaylib Furness C 08/10/2013, 4:05 PM

## 2013-08-17 IMAGING — CR DG CHEST 2V
2 series · 2 of 2 positions shown · non-contrast
Comparison: 10/24/2012

CLINICAL DATA: Prior CABG.  Prior smoker.  Coronary artery disease.
COPD.

CHEST - 2 VIEW

[w chest pa]
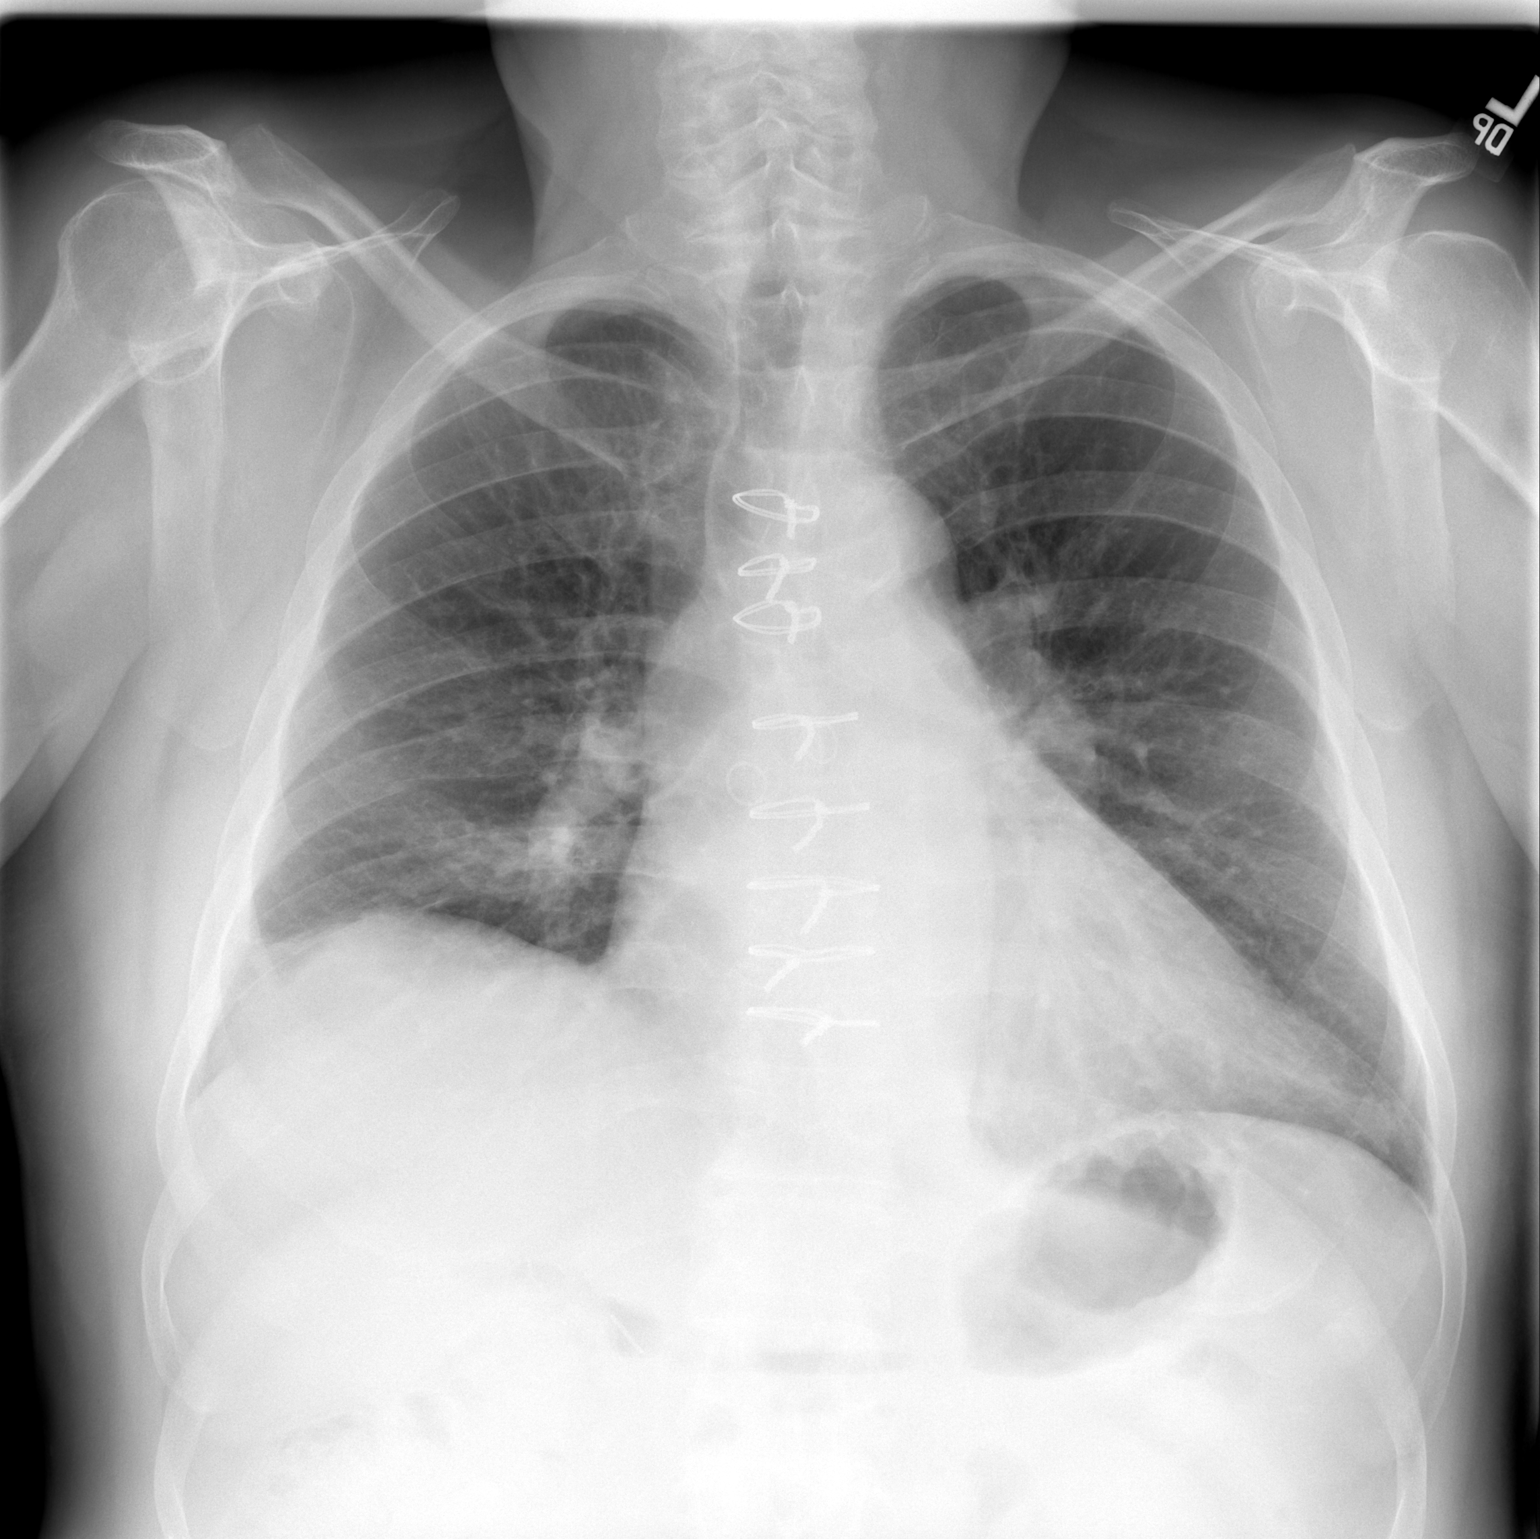

[w chest lat]
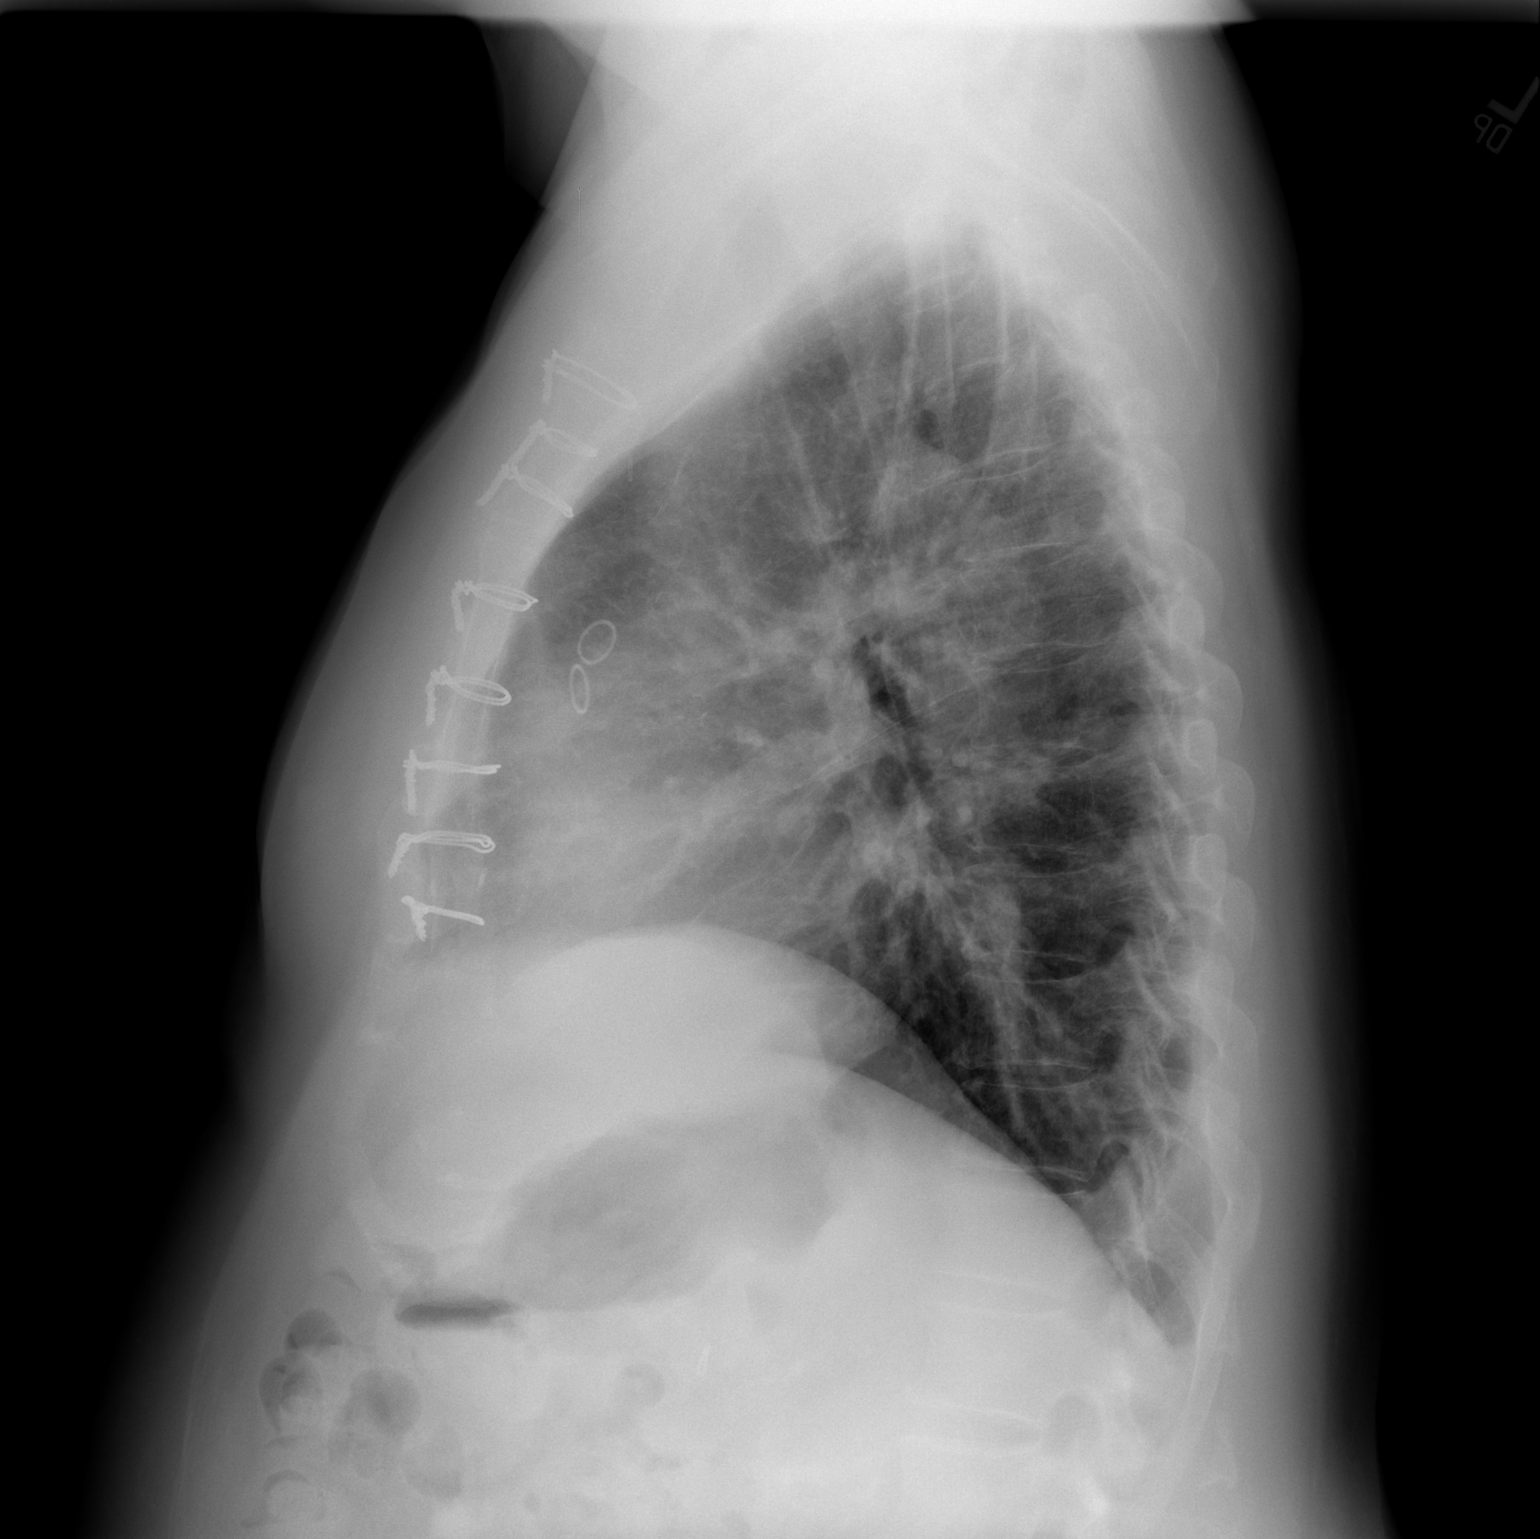

[2 of 2 positions shown; findings below may reference images not displayed]

FINDINGS: Prior CABG noted, with near-complete resolution of the
prior pleural effusions.  Moderate cardiomegaly noted without
edema.  Low lung volumes are present.  There is mild airway
thickening.
IMPRESSION: 1.  Further reduction and near resolution of the pleural effusions.
2.  Moderate cardiomegaly, without edema.
3.  Mild airway thickening, query bronchitis or reactive airways
disease.

## 2013-12-14 ENCOUNTER — Telehealth: Payer: Self-pay | Admitting: Internal Medicine

## 2013-12-14 NOTE — Telephone Encounter (Signed)
Can you help this pt. out

## 2013-12-14 NOTE — Telephone Encounter (Signed)
Would like to know if his CT Scan can be set up before his appt in July .Marland Kitchen Please Call

## 2013-12-14 NOTE — Telephone Encounter (Signed)
routin

## 2013-12-18 NOTE — Telephone Encounter (Signed)
Will forward to scheduler.

## 2014-01-01 ENCOUNTER — Other Ambulatory Visit: Payer: Self-pay | Admitting: *Deleted

## 2014-01-01 DIAGNOSIS — R911 Solitary pulmonary nodule: Secondary | ICD-10-CM

## 2014-01-11 ENCOUNTER — Ambulatory Visit
Admission: RE | Admit: 2014-01-11 | Discharge: 2014-01-11 | Disposition: A | Discharge status: Home / Self Care | Payer: Medicare Other | Source: Ambulatory Visit | Attending: Internal Medicine | Admitting: Internal Medicine

## 2014-01-15 ENCOUNTER — Telehealth: Payer: Self-pay | Admitting: *Deleted

## 2014-01-15 DIAGNOSIS — R911 Solitary pulmonary nodule: Secondary | ICD-10-CM

## 2014-01-15 NOTE — Telephone Encounter (Signed)
Patient notified of test results and repeat CT chest in 6 months. Voiced understanding.

## 2014-01-15 NOTE — Telephone Encounter (Signed)
Message copied by Lindell SparELKINS, JENNA M on Mon Jan 15, 2014  4:44 PM ------      Message from: Chrystie NoseHILTY, KENNETH C      Created: Thu Jan 11, 2014 12:35 PM       Right lung nodule stable - recommend repeat non-contrast chest CT in 6 months.            Dr. Rennis GoldenHilty ------

## 2014-01-18 ENCOUNTER — Encounter: Payer: Self-pay | Admitting: Internal Medicine

## 2014-01-18 ENCOUNTER — Ambulatory Visit (INDEPENDENT_AMBULATORY_CARE_PROVIDER_SITE_OTHER): Payer: Medicare Other | Admitting: Internal Medicine

## 2014-01-18 VITALS — BP 122/68 | HR 65 | Ht 67.0 in | Wt 223.3 lb

## 2014-01-18 DIAGNOSIS — J449 Chronic obstructive pulmonary disease, unspecified: Secondary | ICD-10-CM

## 2014-01-18 DIAGNOSIS — I25119 Atherosclerotic heart disease of native coronary artery with unspecified angina pectoris: Secondary | ICD-10-CM

## 2014-01-18 DIAGNOSIS — I209 Angina pectoris, unspecified: Secondary | ICD-10-CM

## 2014-01-18 DIAGNOSIS — Z951 Presence of aortocoronary bypass graft: Secondary | ICD-10-CM

## 2014-01-18 DIAGNOSIS — R079 Chest pain, unspecified: Secondary | ICD-10-CM

## 2014-01-18 DIAGNOSIS — I2510 Atherosclerotic heart disease of native coronary artery without angina pectoris: Secondary | ICD-10-CM

## 2014-01-18 DIAGNOSIS — R911 Solitary pulmonary nodule: Secondary | ICD-10-CM

## 2014-01-18 DIAGNOSIS — I10 Essential (primary) hypertension: Secondary | ICD-10-CM

## 2014-01-18 DIAGNOSIS — R0789 Other chest pain: Secondary | ICD-10-CM

## 2014-01-18 NOTE — Progress Notes (Signed)
OFFICE NOTE  Chief Complaint:  Chest wall pain, high blood pressure  Primary Care Physician: Leanna Sato, MD  HPI:  [name] is a 68 y.o. male with a known history of coronary artery disease, hypertension, hyperlipidemia, and long-standing tobacco abuse. The patient underwent cardiac catheterization in 2004 with symptoms of stable angina, and was found to have chronic occlusion of the right coronary artery with left-to-right collaterals and insignificant disease in the left coronary system. He was treated medically. He has done well for years, then he began to develop classical symptoms of exertional angina. At the time, he was helping a friend frame a house and he began to experience some substernal chest pain radiating to the jaw that was usually promptly relieved by either rest or sublingual nitroglycerin. Over the last 3 weeks, symptoms continued to accelerate until the day prior to admission, when the patient developed a more severe episode of pain while walking a short distance. The pain again was relieved by sublingual nitroglycerin. He subsequently presented to the emergency department at [facility] where an EKG was performed demonstrating subtle ST segment depression in the anterolateral leads. Troponin levels remained negative. He was transferred to [facility] for further cardiac evaluation. The patient was admitted to [facility] on 10/18/2012. He underwent diagnostic catheterization on 10/19/2012 by Dr. Rennis Golden which showed left main and severe three vessel coronary artery disease with preserved left ventricular function. A cardiac surgery consult was requested and Dr. Cornelius Moras saw the patient. It was felt that he would benefit from surgical revascularization. All risks, benefits and alternatives of surgery were explained in detail, and the patient agreed to proceed.  The patient was taken to the operating room and underwent CABG x 3 (LIMA to LAD, SVG to OM and SVG to PDA. He was noted to  be mildly volume overloaded after discharge and was placed on Lasix and ultimately this was discontinued. He last followed up with Dr. Cornelius Moras and was felt to be stable and followup was requested as needed.    Today he is following up with [place] in the office for the first time and has some complaints about chest wall pain. In fact he is reportedly sore over the midsternal area especially when lifting objects. He pointed out a depression in his midsternum and wonders if this is an area where possibly the sternum did not fully heal. He did report feeling very sore around this area.  In addition he's been having problems with accelerated hypertension, in fact his blood pressure today in the office was 200/98 initially, but did come down to 160/90.  At times he feels like he wakes up at night and feels that his heart is beating harder than typical. He denies any significant chest pain that was similar to before his bypass surgery. Since increasing his amlodipine, his blood pressure is now much better controlled.  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 The results were reviewed by Dr. Cornelius Moras, his surgeon who felt that there was little else to do.  He is not having any significant pain with exercise or heavy lifting.  Unfortunately, there was a small pulmonary nodule and given his history of smoking and COPD, it was recommended that he have a followup CT scan in 6 months.  [name] returns today for followup. In the intermediate had his repeat chest CT scan which shows a stable 7 mm lung nodule. An additional CT scan was  recommended in 6 months.  PMHx:  Past Medical History  Diagnosis Date  . Coronary artery disease   . Hypertension   . Dyslipidemia 10/18/2012  . 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 Cornelius Moras)  . Varicose veins     Past Surgical History  Procedure Laterality Date  .  Cholecystectomy    . Coronary artery bypass graft N/A 10/20/2012    Procedure: CORONARY ARTERY BYPASS GRAFTING (CABG);  Surgeon: Purcell Nails, MD;  Location: [facility] OR;  Service: Open Heart Surgery;  Laterality: N/A;  . Intraoperative transesophageal echocardiogram N/A 10/20/2012    Procedure: INTRAOPERATIVE TRANSESOPHAGEAL ECHOCARDIOGRAM;  Surgeon: Purcell Nails, MD;  Location: [facility] OR;  Service: Open Heart Surgery;  Laterality: N/A;  . Cardiac catheterization  03/26/2003    occluded RCA with collaterals from L to R, 60% mid Cfx disease, 40% branch disease to OM, LAD with 50% stenosis (Dr. Laurell Josephs)   . Shoulder surgery  2002    FAMHx:  Family History  Problem Relation Age of Onset  . Coronary artery disease Mother     SOCHx:   reports that he quit smoking about 15 months ago. His smoking use included Cigarettes. He smoked 2.00 packs per day. He has never used smokeless tobacco. He reports that he does not drink alcohol or use illicit drugs.  ALLERGIES:  No Known Allergies  ROS: A comprehensive review of systems was negative except for: Musculoskeletal: positive for sternal pain  HOME MEDS: Current Outpatient Prescriptions  Medication Sig Dispense Refill  . amLODipine (NORVASC) 10 MG tablet Take 1 tablet (10 mg total) by mouth daily.  30 tablet  6  . aspirin EC 325 MG EC tablet Take 1 tablet (325 mg total) by mouth daily.  30 tablet    . atorvastatin (LIPITOR) 40 MG tablet Take 40 mg by mouth daily.      . metoprolol (LOPRESSOR) 50 MG tablet Take 50 mg by mouth daily.      . mometasone (NASONEX) 50 MCG/ACT nasal spray Place 2 sprays into the nose daily.  17 g  0  . quinapril (ACCUPRIL) 20 MG tablet Take 20 mg by mouth at bedtime.      Marland Kitchen tiotropium (SPIRIVA) 18 MCG inhalation capsule Place 18 mcg into inhaler and inhale daily as needed.        No current facility-administered medications for this visit.    LABS/IMAGING: No results found for this or any previous visit (from the  past 48 hour(s)). No results found.  VITALS: BP 122/68  Pulse 65  Ht 5\' 7"  (1.702 m)  Wt 223 lb 4.8 oz (101.288 kg)  BMI 34.97 kg/m2  EXAM: General appearance: alert and no distress Neck: no carotid bruit and no JVD Lungs: clear to auscultation bilaterally Heart: regular rate and rhythm, S1, S2 normal, no murmur, click, rub or gallop Abdomen: soft, non-tender; bowel sounds normal; no masses,  no organomegaly Extremities: edema 1 Pulses: 2+ and symmetric Skin: Skin color, texture, turgor normal. No rashes or lesions Neurologic: Grossly normal  EKG: Normal sinus rhythm at 65 with incomplete right bundle-branch block  ASSESSMENT: 1. Coronary artery disease status post three-vessel CABG in 2014 2. 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 HTN - controlled 4. Dyslipidemia-at goal 5. Solitary pulmonary nodule 6. Bilateral lower extremity edema  PLAN: 1.   [name]  is doing very well. 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 He is sometimes bothered by chest discomfort at the sternal wound but mostly at night when laying on his side. In general he can lift things and is having no problems. Blood pressure is well-controlled. His cholesterol was recently assessed and was within normal limits. His pulmonary nodule is stable. I've ordered a repeat CT scan for 6 months per radiology guidelines. Overall is doing well I've encouraged him to continue work on exercise and weight loss.  Chrystie NoseKenneth C. Hilty, MD, [facility]aughters[facility]ACC Attending Cardiologist [facility]  HILTY,Kenneth C 01/18/2014, 3:38 PM

## 2014-01-18 NOTE — Patient Instructions (Signed)
Your physician wants you to follow-up in:  6 months. You will receive a reminder letter in the mail two months in advance. If you don't receive a letter, please call our office to schedule the follow-up appointment.   

## 2014-06-21 ENCOUNTER — Encounter (HOSPITAL_COMMUNITY): Payer: Self-pay | Admitting: Internal Medicine

## 2014-07-18 ENCOUNTER — Encounter: Payer: Self-pay | Admitting: Internal Medicine

## 2014-07-18 ENCOUNTER — Ambulatory Visit (INDEPENDENT_AMBULATORY_CARE_PROVIDER_SITE_OTHER): Payer: Medicare Other | Admitting: Internal Medicine

## 2014-07-18 VITALS — BP 122/64 | HR 59 | Ht 67.0 in | Wt 224.0 lb

## 2014-07-18 DIAGNOSIS — I2510 Atherosclerotic heart disease of native coronary artery without angina pectoris: Secondary | ICD-10-CM

## 2014-07-18 DIAGNOSIS — R0609 Other forms of dyspnea: Secondary | ICD-10-CM

## 2014-07-18 DIAGNOSIS — Z79899 Other long term (current) drug therapy: Secondary | ICD-10-CM

## 2014-07-18 DIAGNOSIS — I10 Essential (primary) hypertension: Secondary | ICD-10-CM

## 2014-07-18 DIAGNOSIS — J449 Chronic obstructive pulmonary disease, unspecified: Secondary | ICD-10-CM

## 2014-07-18 DIAGNOSIS — E785 Hyperlipidemia, unspecified: Secondary | ICD-10-CM

## 2014-07-18 DIAGNOSIS — Z951 Presence of aortocoronary bypass graft: Secondary | ICD-10-CM

## 2014-07-18 DIAGNOSIS — R911 Solitary pulmonary nodule: Secondary | ICD-10-CM

## 2014-07-18 MED ORDER — AMLODIPINE BESYLATE 5 MG PO TABS: 5.0000 mg | ORAL_TABLET | Freq: Every day | ORAL | Status: DC

## 2014-07-18 MED ORDER — QUINAPRIL-HYDROCHLOROTHIAZIDE 20-25 MG PO TABS: 1.0000 | ORAL_TABLET | Freq: Every day | ORAL | Status: DC

## 2014-07-18 NOTE — Progress Notes (Signed)
OFFICE NOTE  Chief Complaint:  Dyspnea, leg swelling  Primary Care Physician: Leanna Sato, MD  HPI:  [name] is a 69 y.o. male with a known history of coronary artery disease, hypertension, hyperlipidemia, and long-standing tobacco abuse. The patient underwent cardiac catheterization in 2004 with symptoms of stable angina, and was found to have chronic occlusion of the right coronary artery with left-to-right collaterals and insignificant disease in the left coronary system. He was treated medically. He has done well for years, then he began to develop classical symptoms of exertional angina. At the time, he was helping a friend frame a house and he began to experience some substernal chest pain radiating to the jaw that was usually promptly relieved by either rest or sublingual nitroglycerin. Over the last 3 weeks, symptoms continued to accelerate until the day prior to admission, when the patient developed a more severe episode of pain while walking a short distance. The pain again was relieved by sublingual nitroglycerin. He subsequently presented to the emergency department at [facility] where an EKG was performed demonstrating subtle ST segment depression in the anterolateral leads. Troponin levels remained negative. He was transferred to [facility] for further cardiac evaluation. The patient was admitted to [facility] on 10/18/2012. He underwent diagnostic catheterization on 10/19/2012 by Dr. Rennis Golden which showed left main and severe three vessel coronary artery disease with preserved left ventricular function. A cardiac surgery consult was requested and Dr. Cornelius Moras saw the patient. It was felt that he would benefit from surgical revascularization. All risks, benefits and alternatives of surgery were explained in detail, and the patient agreed to proceed.  The patient was taken to the operating room and underwent CABG x 3 (LIMA to LAD, SVG to OM and SVG to PDA. He was noted to be mildly volume  overloaded after discharge and was placed on Lasix and ultimately this was discontinued. He last followed up with Dr. Cornelius Moras and was felt to be stable and followup was requested as needed.    Today he is following up with [place] in the office for the first time and has some complaints about chest wall pain. In fact he is reportedly sore over the midsternal area especially when lifting objects. He pointed out a depression in his midsternum and wonders if this is an area where possibly the sternum did not fully heal. He did report feeling very sore around this area.  In addition he's been having problems with accelerated hypertension, in fact his blood pressure today in the office was 200/98 initially, but did come down to 160/90.  At times he feels like he wakes up at night and feels that his heart is beating harder than typical. He denies any significant chest pain that was similar to before his bypass surgery. Since increasing his amlodipine, his blood pressure is now much better controlled.  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 The results were reviewed by Dr. Cornelius Moras, his surgeon who felt that there was little else to do.  He is not having any significant pain with exercise or heavy lifting.  Unfortunately, there was a small pulmonary nodule and given his history of smoking and COPD, it was recommended that he have a followup CT scan in 6 months.  [name] returns today for followup. His only complaint is some worsening dyspnea. He is prescribed for Spiriva however takes it very infrequently. He was unaware that this needed to be  taken daily. He is also complaining of worsening lower extremity edema. His recently diagnosed with bilateral knee osteoarthritis.  PMHx:  Past Medical History  Diagnosis Date  . Coronary artery disease   . Hypertension   . Dyslipidemia 10/18/2012  . 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 Cornelius Moras)  . Varicose veins     Past Surgical History  Procedure Laterality Date  . Cholecystectomy    . Coronary artery bypass graft N/A 10/20/2012    Procedure: CORONARY ARTERY BYPASS GRAFTING (CABG);  Surgeon: Purcell Nails, MD;  Location[facility] OR;  Service: Open Heart Surgery;  Laterality: N/A;  . Intraoperative transesophageal echocardiogram N/A 10/20/2012    Procedure: INTRAOPERATIVE TRANSESOPHAGEAL ECHOCARDIOGRAM;  Surgeon: Purcell Nails, MD;  Location: [facility] OR;  Service: Open Heart Surgery;  Laterality: N/A;  . Cardiac catheterization  03/26/2003    occluded RCA with collaterals from L to R, 60% mid Cfx disease, 40% branch disease to OM, LAD with 50% stenosis (Dr. Laurell Josephs)   . Shoulder surgery  2002  . Left heart catheterization with coronary angiogram N/A 10/19/2012    Procedure: LEFT HEART CATHETERIZATION WITH CORONARY ANGIOGRAM;  Surgeon: Chrystie Nose, MD;  Location: [facility] CATH LAB;  Service: Cardiovascular;  Laterality: N/A;    FAMHx:  Family History  Problem Relation Age of Onset  . Coronary artery disease Mother     SOCHx:   reports that he quit smoking about 21 months ago. His smoking use included Cigarettes. He smoked 2.00 packs per day. He has never used smokeless tobacco. He reports that he does not drink alcohol or use illicit drugs.  ALLERGIES:  No Known Allergies  ROS: A comprehensive review of systems was negative except for: Respiratory: positive for dyspnea on exertion Cardiovascular: positive for lower extremity edema Musculoskeletal: positive for arthralgias and sternal pain  HOME MEDS: Current Outpatient Prescriptions  Medication Sig Dispense Refill  . aspirin EC 325 MG EC tablet Take 1 tablet (325 mg total) by mouth daily. 30 tablet   . atorvastatin (LIPITOR) 40 MG tablet Take 40 mg by mouth daily.    . metoprolol (LOPRESSOR) 50 MG tablet Take 50 mg by mouth daily.    . mometasone (NASONEX) 50 MCG/ACT nasal spray Place 2  sprays into the nose daily. 17 g 0  . tiotropium (SPIRIVA) 18 MCG inhalation capsule Place 18 mcg into inhaler and inhale daily as needed.     Marland Kitchen amLODipine (NORVASC) 5 MG tablet Take 1 tablet (5 mg total) by mouth daily. 180 tablet 3  . quinapril-hydrochlorothiazide (ACCURETIC) 20-25 MG per tablet Take 1 tablet by mouth daily. 90 tablet 3   No current facility-administered medications for this visit.    LABS/IMAGING: No results found for this or any previous visit (from the past 48 hour(s)). No results found.  VITALS: BP 122/64 mmHg  Pulse 59  Ht  (1.702 m)  Wt 224 lb (101.606 kg)  BMI 35.08 kg/m2  EXAM: General appearance: alert and no distress Neck: no carotid bruit and no JVD Lungs: clear to auscultation bilaterally Heart: regular rate and rhythm, S1, S2 normal, no murmur, click, rub or gallop Abdomen: soft, non-tender; bowel sounds normal; no masses,  no organomegaly Extremities: edema 1 Pulses: 2+ and symmetric Skin: Skin color, texture, turgor normal. No rashes or lesions Neurologic: Grossly normal  EKG: Sinus bradycardia 59  ASSESSMENT: 1. Coronary artery disease status post three-vessel CABG in 2014 2. 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 HTN - controlled 4. Dyslipidemia-at goal 5. Solitary pulmonary nodule 6. Bilateral lower extremity edema 7. COPD  PLAN: 1.   [name] is describing some progressive shortness of breath. He has some worsening lower extremity swelling. He has been noncompliant with his Spiriva, and I've advised that he start taking that on a daily basis. I believe some of the swelling may be due to Norvasc. I would recommend decreasing that down to 5 mg and adding HCTZ to his quinapril to help with some of his edema. He should continue to use compression stockings. He is due for recheck CT scan to evaluate for any changes in the size of his pulmonary nodule. If these medication changes do not help with the shortness of breath  we may consider an echocardiogram to evaluate for any development of cardiomyopathy. Plan for recheck BMP and BNP next week.  Follow-up in 1 month.  Chrystie NoseKenneth C. Lajoya Dombek, MD, [facility] Attending Cardiologist [facility]  Imberly Troxler C 07/18/2014, 4:48 PM

## 2014-07-18 NOTE — Patient Instructions (Addendum)
Your physician recommends that you return for lab work in: 5 days  DECREASE your Norvasc to 5mg  Daily  START Accuretic 20-25 mg daily  Your physician recommends that you schedule a follow-up appointment in: 1 month.  Non-Cardiac CT scanning, (CAT scanning), is a noninvasive, special x-ray that produces cross-sectional images of the body using x-rays and a computer. CT scans help physicians diagnose and treat medical conditions. For some CT exams, a contrast material is used to enhance visibility in the area of the body being studied. CT scans provide greater clarity and reveal more details than regular x-ray exams.

## 2014-07-20 ENCOUNTER — Ambulatory Visit
Admission: RE | Admit: 2014-07-20 | Discharge: 2014-07-20 | Disposition: A | Discharge status: Home / Self Care | Payer: Medicare Other | Source: Ambulatory Visit | Attending: Internal Medicine | Admitting: Internal Medicine

## 2014-07-20 DIAGNOSIS — R911 Solitary pulmonary nodule: Secondary | ICD-10-CM

## 2014-07-24 ENCOUNTER — Telehealth: Payer: Self-pay | Admitting: Internal Medicine

## 2014-07-24 ENCOUNTER — Other Ambulatory Visit: Payer: Self-pay | Admitting: *Deleted

## 2014-07-24 DIAGNOSIS — R911 Solitary pulmonary nodule: Secondary | ICD-10-CM

## 2014-07-24 NOTE — Telephone Encounter (Signed)
Lab orders in computer confirmed, pt given directions & contact info for [facility] [place].

## 2014-07-24 NOTE — Telephone Encounter (Signed)
Pt need a new lab order so he can have it [facility].Please send it down,he will come Friday for lab work.Please let him know when you do this.

## 2014-07-28 LAB — BASIC METABOLIC PANEL
BUN: 18 mg/dL (ref 6–23)
CO2: 28 meq/L (ref 19–32)
CREATININE: 1.12 mg/dL (ref 0.50–1.35)
Calcium: 9 mg/dL (ref 8.4–10.5)
Chloride: 99 mEq/L (ref 96–112)
Glucose, Bld: 87 mg/dL (ref 70–99)
POTASSIUM: 4 meq/L (ref 3.5–5.3)
SODIUM: 139 meq/L (ref 135–145)

## 2014-07-28 LAB — BRAIN NATRIURETIC PEPTIDE: Brain Natriuretic Peptide: 22.7 pg/mL (ref 0.0–100.0)

## 2014-07-31 ENCOUNTER — Encounter: Payer: Self-pay | Admitting: *Deleted

## 2014-08-21 ENCOUNTER — Ambulatory Visit (INDEPENDENT_AMBULATORY_CARE_PROVIDER_SITE_OTHER): Payer: Medicare Other | Admitting: Internal Medicine

## 2014-08-21 ENCOUNTER — Encounter: Payer: Self-pay | Admitting: Internal Medicine

## 2014-08-21 VITALS — BP 116/60 | HR 72 | Ht 67.0 in | Wt 223.7 lb

## 2014-08-21 DIAGNOSIS — I10 Essential (primary) hypertension: Secondary | ICD-10-CM

## 2014-08-21 DIAGNOSIS — I2510 Atherosclerotic heart disease of native coronary artery without angina pectoris: Secondary | ICD-10-CM

## 2014-08-21 DIAGNOSIS — Z951 Presence of aortocoronary bypass graft: Secondary | ICD-10-CM

## 2014-08-21 DIAGNOSIS — I2583 Coronary atherosclerosis due to lipid rich plaque: Secondary | ICD-10-CM

## 2014-08-21 NOTE — Patient Instructions (Signed)
Return in 6 months

## 2014-08-22 NOTE — Progress Notes (Signed)
OFFICE NOTE  Chief Complaint:  "I feel much better"  Primary Care Physician: Leanna Sato, MD  HPI:  [name] is a 69 y.o. male with a known history of coronary artery disease, hypertension, hyperlipidemia, and long-standing tobacco abuse. The patient underwent cardiac catheterization in 2004 with symptoms of stable angina, and was found to have chronic occlusion of the right coronary artery with left-to-right collaterals and insignificant disease in the left coronary system. He was treated medically. He has done well for years, then he began to develop classical symptoms of exertional angina. At the time, he was helping a friend frame a house and he began to experience some substernal chest pain radiating to the jaw that was usually promptly relieved by either rest or sublingual nitroglycerin. Over the last 3 weeks, symptoms continued to accelerate until the day prior to admission, when the patient developed a more severe episode of pain while walking a short distance. The pain again was relieved by sublingual nitroglycerin. He subsequently presented to the emergency department at [facility] where an EKG was performed demonstrating subtle ST segment depression in the anterolateral leads. Troponin levels remained negative. He was transferred to [facility] for further cardiac evaluation. The patient was admitted to [facility] on 10/18/2012. He underwent diagnostic catheterization on 10/19/2012 by Dr. Rennis Golden which showed left main and severe three vessel coronary artery disease with preserved left ventricular function. A cardiac surgery consult was requested and Dr. Cornelius Moras saw the patient. It was felt that he would benefit from surgical revascularization. All risks, benefits and alternatives of surgery were explained in detail, and the patient agreed to proceed.  The patient was taken to the operating room and underwent CABG x 3 (LIMA to LAD, SVG to OM and SVG to PDA. He was noted to be mildly volume  overloaded after discharge and was placed on Lasix and ultimately this was discontinued. He last followed up with Dr. Cornelius Moras and was felt to be stable and followup was requested as needed.    Today he is following up with [place] in the office for the first time and has some complaints about chest wall pain. In fact he is reportedly sore over the midsternal area especially when lifting objects. He pointed out a depression in his midsternum and wonders if this is an area where possibly the sternum did not fully heal. He did report feeling very sore around this area.  In addition he's been having problems with accelerated hypertension, in fact his blood pressure today in the office was 200/98 initially, but did come down to 160/90.  At times he feels like he wakes up at night and feels that his heart is beating harder than typical. He denies any significant chest pain that was similar to before his bypass surgery. Since increasing his amlodipine, his blood pressure is now much better controlled.  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 The results were reviewed by Dr. Cornelius Moras, his surgeon who felt that there was little else to do.  He is not having any significant pain with exercise or heavy lifting.  Unfortunately, there was a small pulmonary nodule and given his history of smoking and COPD, it was recommended that he have a followup CT scan in 6 months.  [name] returns today for followup. At his last office visit he was complaining of some worsening shortness of breath and had lower extremity swelling. I decreased his Norvasc to  5 mg and increased his Accuretic to 20/25 mg daily. He reports a marked improvement in his symptoms and his swelling has almost totally gone away. I checked laboratory work which shows normal renal function and a very low BNP of 22.  PMHx:  Past Medical History  Diagnosis Date  . Coronary artery disease   . Hypertension   . Dyslipidemia 10/18/2012    . 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 Cornelius Moraswen)  . Varicose veins     Past Surgical History  Procedure Laterality Date  . Cholecystectomy    . Coronary artery bypass graft N/A 10/20/2012    Procedure: CORONARY ARTERY BYPASS GRAFTING (CABG);  Surgeon: Purcell Nailslarence H [name], MD;  [facility];  Service: Open Heart Surgery;  Laterality: N/A;  . Intraoperative transesophageal echocardiogram N/A 10/20/2012    Procedure: INTRAOPERATIVE TRANSESOPHAGEAL ECHOCARDIOGRAM;  Surgeon: Purcell Nailslarence H [name], MD;  [facility];  Service: Open Heart Surgery;  Laterality: N/A;  . Cardiac catheterization  03/26/2003    occluded RCA with collaterals from L to R, 60% mid Cfx disease, 40% branch disease to OM, LAD with 50% stenosis (Dr. Laurell Josephs. McQueen)   . Shoulder surgery  2002  . Left heart catheterization with coronary angiogram N/A 10/19/2012    Procedure: LEFT HEART CATHETERIZATION WITH CORONARY ANGIOGRAM;  Surgeon: Chrystie NoseKenneth C. Hilty, MD;  [facility];  Service: Cardiovascular;  Laterality: N/A;    FAMHx:  Family History  Problem Relation Age of Onset  . Coronary artery disease Mother     SOCHx:   reports that he quit smoking about 22 months ago. His smoking use included Cigarettes. He smoked 2.00 packs per day. He has never used smokeless tobacco. He reports that he does not drink alcohol or use illicit drugs.  ALLERGIES:  No Known Allergies  ROS: A comprehensive review of systems was negative.  HOME MEDS: Current Outpatient Prescriptions  Medication Sig Dispense Refill  . amLODipine (NORVASC) 5 MG tablet Take 1 tablet (5 mg total) by mouth daily. 180 tablet 3  . aspirin EC 325 MG EC tablet Take 1 tablet (325 mg total) by mouth daily. 30 tablet   . atorvastatin (LIPITOR) 40 MG tablet Take 40 mg by mouth daily.    . metoprolol (LOPRESSOR) 50 MG tablet Take 50 mg by mouth daily.    . mometasone (NASONEX) 50 MCG/ACT  nasal spray Place 2 sprays into the nose daily. 17 g 0  . quinapril-hydrochlorothiazide (ACCURETIC) 20-25 MG per tablet Take 1 tablet by mouth daily. 90 tablet 3  . tiotropium (SPIRIVA) 18 MCG inhalation capsule Place 18 mcg into inhaler and inhale daily as needed.      No current facility-administered medications for this visit.    LABS/IMAGING: No results found for this or any previous visit (from the past 48 hour(s)). No results found.  VITALS: BP 116/60 mmHg  Pulse 72  Ht 5\' 7"  (1.702 m)  Wt 223 lb 11.2 oz (101.47 kg)  BMI 35.03 kg/m2  EXAM: General appearance: alert and no distress Neck: no carotid bruit and no JVD Lungs: clear to auscultation bilaterally Heart: regular rate and rhythm, S1, S2 normal, no murmur, click, rub or gallop Abdomen: soft, non-tender; bowel sounds normal; no masses,  no organomegaly Extremities: extremities normal, atraumatic, no cyanosis or edema Pulses: 2+ and symmetric Skin: Skin color, texture, turgor normal. No rashes  or lesions Neurologic: Grossly normal  EKG: deferred  ASSESSMENT: 1. Coronary artery disease status post three-vessel CABG in 2014 2. 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 HTN - controlled 4. Dyslipidemia-at goal 5. Solitary pulmonary nodule 6. Bilateral lower extremity edema - resolved 7. COPD  PLAN: 1.   [name] has had resolution of his lower extremity edema, which I suspect was mostly due to Norvasc. We'll continue his current medications as his breathlessness is improved as well. Plan to see him back in 6 months.  Chrystie Nose, MD, [facility] Attending Cardiologist [facility]  HILTY,Kenneth C 08/22/2014, 6:49 PM

## 2014-11-22 ENCOUNTER — Other Ambulatory Visit: Payer: Self-pay | Admitting: Internal Medicine

## 2014-11-22 MED ORDER — AMLODIPINE BESYLATE 5 MG PO TABS: 5.0000 mg | ORAL_TABLET | Freq: Every day | ORAL | Status: DC

## 2014-11-22 MED ORDER — QUINAPRIL-HYDROCHLOROTHIAZIDE 20-25 MG PO TABS: 1.0000 | ORAL_TABLET | Freq: Every day | ORAL | Status: DC

## 2014-11-22 NOTE — Telephone Encounter (Signed)
°  1. Which medications need to be refilled?  Quinapril and Amlodipine 2. Which pharmacy is medication to be sent to?[phone]  3. Do they need a 30 day or 90 day supply? 90 and refills  4. Would they like a call back once the medication has been sent to the pharmacy? yes

## 2014-11-22 NOTE — Telephone Encounter (Signed)
E-sent to pharmacy. Patient aware

## 2015-04-02 ENCOUNTER — Ambulatory Visit (INDEPENDENT_AMBULATORY_CARE_PROVIDER_SITE_OTHER): Payer: Medicare Other | Admitting: Internal Medicine

## 2015-04-02 ENCOUNTER — Encounter: Payer: Self-pay | Admitting: Internal Medicine

## 2015-04-02 VITALS — BP 116/74 | HR 63 | Ht 67.0 in | Wt 215.8 lb

## 2015-04-02 DIAGNOSIS — Z720 Tobacco use: Secondary | ICD-10-CM

## 2015-04-02 DIAGNOSIS — J449 Chronic obstructive pulmonary disease, unspecified: Secondary | ICD-10-CM | POA: Diagnosis not present

## 2015-04-02 DIAGNOSIS — F17200 Nicotine dependence, unspecified, uncomplicated: Secondary | ICD-10-CM

## 2015-04-02 DIAGNOSIS — I10 Essential (primary) hypertension: Secondary | ICD-10-CM

## 2015-04-02 DIAGNOSIS — I2510 Atherosclerotic heart disease of native coronary artery without angina pectoris: Secondary | ICD-10-CM

## 2015-04-02 DIAGNOSIS — I2583 Coronary atherosclerosis due to lipid rich plaque: Secondary | ICD-10-CM

## 2015-04-02 DIAGNOSIS — Z951 Presence of aortocoronary bypass graft: Secondary | ICD-10-CM | POA: Diagnosis not present

## 2015-04-02 NOTE — Patient Instructions (Signed)
Your physician wants you to follow-up in: 6 months with Dr. Hilty. You will receive a reminder letter in the mail two months in advance. If you don't receive a letter, please call our office to schedule the follow-up appointment.    

## 2015-04-02 NOTE — Progress Notes (Signed)
OFFICE NOTE  Chief Complaint:  Occasional shortness of breath related to COPD  Primary Care Physician: Leanna Sato, MD  HPI:  [name] is a 69 y.o. male with a known history of coronary artery disease, hypertension, hyperlipidemia, and long-standing tobacco abuse. The patient underwent cardiac catheterization in 2004 with symptoms of stable angina, and was found to have chronic occlusion of the right coronary artery with left-to-right collaterals and insignificant disease in the left coronary system. He was treated medically. He has done well for years, then he began to develop classical symptoms of exertional angina. At the time, he was helping a friend frame a house and he began to experience some substernal chest pain radiating to the jaw that was usually promptly relieved by either rest or sublingual nitroglycerin. Over the last 3 weeks, symptoms continued to accelerate until the day prior to admission, when the patient developed a more severe episode of pain while walking a short distance. The pain again was relieved by sublingual nitroglycerin. He subsequently presented to the emergency department at [facility] where an EKG was performed demonstrating subtle ST segment depression in the anterolateral leads. Troponin levels remained negative. He was transferred to [facility] for further cardiac evaluation. The patient was admitted to [facility] on 10/18/2012. He underwent diagnostic catheterization on 10/19/2012 by Dr. Rennis Golden which showed left main and severe three vessel coronary artery disease with preserved left ventricular function. A cardiac surgery consult was requested and Dr. Cornelius Moras saw the patient. It was felt that he would benefit from surgical revascularization. All risks, benefits and alternatives of surgery were explained in detail, and the patient agreed to proceed.  The patient was taken to the operating room and underwent CABG x 3 (LIMA to LAD, SVG to OM and SVG to PDA. He was  noted to be mildly volume overloaded after discharge and was placed on Lasix and ultimately this was discontinued. He last followed up with Dr. Cornelius Moras and was felt to be stable and followup was requested as needed.    Today he is following up with [place] in the office for the first time and has some complaints about chest wall pain. In fact he is reportedly sore over the midsternal area especially when lifting objects. He pointed out a depression in his midsternum and wonders if this is an area where possibly the sternum did not fully heal. He did report feeling very sore around this area.  In addition he's been having problems with accelerated hypertension, in fact his blood pressure today in the office was 200/98 initially, but did come down to 160/90.  At times he feels like he wakes up at night and feels that his heart is beating harder than typical. He denies any significant chest pain that was similar to before his bypass surgery. Since increasing his amlodipine, his blood pressure is now much better controlled.  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 The results were reviewed by Dr. Cornelius Moras, his surgeon who felt that there was little else to do.  He is not having any significant pain with exercise or heavy lifting.  Unfortunately, there was a small pulmonary nodule and given his history of smoking and COPD, it was recommended that he have a followup CT scan in 6 months.  [name] returns today for followup. At his last office visit he was complaining of some worsening shortness of breath and had lower extremity swelling. I decreased  his Norvasc to 5 mg and increased his Accuretic to 20/25 mg daily. He reports a marked improvement in his symptoms and his swelling has almost totally gone away. I checked laboratory work which shows normal renal function and a very low BNP of 22.  Summary [name] in the office today. Overall he seems to be doing really well. The pressure  is stable at this time he denies any chest pain. He is due for repeat CT scan in January for follow-up of a pulmonary nodule. Otherwise from a cardiac standpoint he is stable. EKG today shows sinus rhythm at 63 and unchanged right bundle branch block.  PMHx:  Past Medical History  Diagnosis Date  . Coronary artery disease   . Hypertension   . Dyslipidemia 10/18/2012  . 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 Cornelius Moras)  . Varicose veins     Past Surgical History  Procedure Laterality Date  . Cholecystectomy    . Coronary artery bypass graft N/A 10/20/2012    Procedure: CORONARY ARTERY BYPASS GRAFTING (CABG);  Surgeon: Purcell Nails, MD;  Location: [facility] OR;  Service: Open Heart Surgery;  Laterality: N/A;  . Intraoperative transesophageal echocardiogram N/A 10/20/2012    Procedure: INTRAOPERATIVE TRANSESOPHAGEAL ECHOCARDIOGRAM;  Surgeon: Purcell Nails, MD;  Location: [facility] OR;  Service: Open Heart Surgery;  Laterality: N/A;  . Cardiac catheterization  03/26/2003    occluded RCA with collaterals from L to R, 60% mid Cfx disease, 40% branch disease to OM, LAD with 50% stenosis (Dr. Laurell Josephs)   . Shoulder surgery  2002  . Left heart catheterization with coronary angiogram N/A 10/19/2012    Procedure: LEFT HEART CATHETERIZATION WITH CORONARY ANGIOGRAM;  Surgeon: Chrystie Nose, MD;  Location: [facility] CATH LAB;  Service: Cardiovascular;  Laterality: N/A;    FAMHx:  Family History  Problem Relation Age of Onset  . Coronary artery disease Mother     SOCHx:   reports that he quit smoking about 2 years ago. His smoking use included Cigarettes. He smoked 2.00 packs per day. He has never used smokeless tobacco. He reports that he does not drink alcohol or use illicit drugs.  ALLERGIES:  No Known Allergies  ROS: A comprehensive review of systems was negative.  HOME MEDS: Current Outpatient Prescriptions  Medication Sig  Dispense Refill  . amLODipine (NORVASC) 5 MG tablet Take 1 tablet (5 mg total) by mouth daily. 90 tablet 3  . aspirin EC 325 MG EC tablet Take 1 tablet (325 mg total) by mouth daily. 30 tablet   . atorvastatin (LIPITOR) 40 MG tablet Take 40 mg by mouth daily.    . metoprolol (LOPRESSOR) 50 MG tablet Take 50 mg by mouth daily.    . mometasone (NASONEX) 50 MCG/ACT nasal spray Place 2 sprays into the nose daily. 17 g 0  . quinapril-hydrochlorothiazide (ACCURETIC) 20-25 MG per tablet Take 0.5 tablets by mouth daily.    Marland Kitchen tiotropium (SPIRIVA) 18 MCG inhalation capsule Place 18 mcg into inhaler and inhale daily as needed.      No current facility-administered medications for this visit.    LABS/IMAGING: No results found for this or any previous visit (from the past 48 hour(s)). No results found.  VITALS: BP 116/74 mmHg  Pulse 63  Ht  (1.702 m)  Wt 215 lb 12.8 oz (97.886 kg)  BMI 33.79 kg/m2  EXAM: General appearance: alert and no distress Neck: no carotid bruit and no JVD Lungs: clear to auscultation bilaterally Heart: regular rate and rhythm, S1, S2 normal, no murmur, click, rub or gallop Abdomen: soft, non-tender; bowel sounds normal; no masses,  no organomegaly Extremities: extremities normal, atraumatic, no cyanosis or edema Pulses: 2+ and symmetric Skin: Skin color, texture, turgor normal. No rashes or lesions Neurologic: Grossly normal  EKG: Normal sinus rhythm at 63, incomplete right bundle branch block  ASSESSMENT: 1. Coronary artery disease status post three-vessel CABG in 2014 2. 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 HTN - controlled 4. Dyslipidemia-at goal 5. Solitary pulmonary nodule - repeat CT scan ordered for January 2017 6. Bilateral lower extremity edema - resolved 7. COPD  PLAN: 1.   [name] has had resolution of his lower extremity edema, which I suspect was mostly due to Norvasc. He denies any shortness of breath or chest  discomfort. Blood pressure is well controlled and cholesterol is at goal. He will have a repeat CT scan for a solitary pulmonary nodule in January 2017. We'll continue his current medications as his breathlessness is improved as well.   Plan to see him back in 6 months.  Chrystie Nose, MD, [facility] Attending Cardiologist [facility]  Chrystie Nose 04/02/2015, 5:26 PM

## 2015-07-14 HISTORY — PX: COLONOSCOPY: SHX174

## 2017-11-29 ENCOUNTER — Encounter: Payer: Self-pay | Admitting: Internal Medicine

## 2017-11-29 ENCOUNTER — Ambulatory Visit (INDEPENDENT_AMBULATORY_CARE_PROVIDER_SITE_OTHER): Payer: Medicare Other | Admitting: Internal Medicine

## 2017-11-29 VITALS — BP 107/67 | HR 54 | Ht 67.0 in | Wt 200.0 lb

## 2017-11-29 DIAGNOSIS — R911 Solitary pulmonary nodule: Secondary | ICD-10-CM

## 2017-11-29 DIAGNOSIS — R0600 Dyspnea, unspecified: Secondary | ICD-10-CM | POA: Diagnosis not present

## 2017-11-29 DIAGNOSIS — I2510 Atherosclerotic heart disease of native coronary artery without angina pectoris: Secondary | ICD-10-CM | POA: Diagnosis not present

## 2017-11-29 NOTE — Patient Instructions (Addendum)
Medication Instructions:   STOP aspirin  START aspirin   Labwork:  NONE  Testing/Procedures:  Dr. Rennis Golden has ordered a non-contrast chest CT to be done at [facility]  Follow-Up:  Your physician wants you to follow-up in: ONE YEAR with Dr. Rennis Golden. You will receive a reminder letter in the mail two months in advance. If you don't receive a letter, please call our office to schedule the follow-up appointment.   If you need a refill on your cardiac medications before your next appointment, please call your pharmacy.  Any Other Special Instructions Will Be Listed Below (If Applicable).

## 2017-11-29 NOTE — Progress Notes (Signed)
OFFICE NOTE  Chief Complaint:  Routine follow-up, dyspnea  Primary Care Physician: Leanna Sato, MD  HPI:  [name] is a 72 y.o. male with a known history of coronary artery disease, hypertension, hyperlipidemia, and long-standing tobacco abuse. The patient underwent cardiac catheterization in 2004 with symptoms of stable angina, and was found to have chronic occlusion of the right coronary artery with left-to-right collaterals and insignificant disease in the left coronary system. He was treated medically. He has done well for years, then he began to develop classical symptoms of exertional angina. At the time, he was helping a friend frame a house and he began to experience some substernal chest pain radiating to the jaw that was usually promptly relieved by either rest or sublingual nitroglycerin. Over the last 3 weeks, symptoms continued to accelerate until the day prior to admission, when the patient developed a more severe episode of pain while walking a short distance. The pain again was relieved by sublingual nitroglycerin. He subsequently presented to the emergency department at [facility] where an EKG was performed demonstrating subtle ST segment depression in the anterolateral leads. Troponin levels remained negative. He was transferred to [facility] for further cardiac evaluation. The patient was admitted to [facility] on 10/18/2012. He underwent diagnostic catheterization on 10/19/2012 by Dr. Rennis Golden which showed left main and severe three vessel coronary artery disease with preserved left ventricular function. A cardiac surgery consult was requested and Dr. Cornelius Moras saw the patient. It was felt that he would benefit from surgical revascularization. All risks, benefits and alternatives of surgery were explained in detail, and the patient agreed to proceed.  The patient was taken to the operating room and underwent CABG x 3 (LIMA to LAD, SVG to OM and SVG to PDA. He was noted to be mildly  volume overloaded after discharge and was placed on Lasix and ultimately this was discontinued. He last followed up with Dr. Cornelius Moras and was felt to be stable and followup was requested as needed.    Today he is following up with [name] in the office for the first time and has some complaints about chest wall pain. In fact he is reportedly sore over the midsternal area especially when lifting objects. He pointed out a depression in his midsternum and wonders if this is an area where possibly the sternum did not fully heal. He did report feeling very sore around this area.  In addition he's been having problems with accelerated hypertension, in fact his blood pressure today in the office was 200/98 initially, but did come down to 160/90.  At times he feels like he wakes up at night and feels that his heart is beating harder than typical. He denies any significant chest pain that was similar to before his bypass surgery. Since increasing his amlodipine, his blood pressure is now much better controlled.  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 The results were reviewed by Dr. Cornelius Moras, his surgeon who felt that there was little else to do.  He is not having any significant pain with exercise or heavy lifting.  Unfortunately, there was a small pulmonary nodule and given his history of smoking and COPD, it was recommended that he have a followup CT scan in 6 months.  [name] returns today for followup. At his last office visit he was complaining of some worsening shortness of breath and had lower extremity swelling. I decreased his Norvasc to  5 mg and increased his Accuretic to 20/25 mg daily. He reports a marked improvement in his symptoms and his swelling has almost totally gone away. I checked laboratory work which shows normal renal function and a very low BNP of 22.  11/29/2017  [name] returns today for follow-up.  I not seen him since 2016.  He does have a history of  coronary disease and underwent cardiac catheterization by myself which showed multivessel coronary disease in 2014.  Subsequently underwent three-vessel CABG with LIMA to LAD, SVG to OM, SVG to PDA.  He is done very well from that standpoint and denies any chest pain or worsening associated symptoms.  He has however been short of breath.  He was diagnosed with some COPD and is on Spiriva which is managed by his primary care provider.  He was also found to have a CT scan that showed a lung nodule.  We follow that up to 2016 which showed stability however he is concerned that with his new symptoms of shortness of breath that there may be some change associated with that.  We discussed pulmonary function testing which she says he typically gets through the [place] [facility].  PMHx:  Past Medical History:  Diagnosis Date  . COPD, 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  . Coronary artery disease   . Dyslipidemia 10/18/2012  . Hypertension   . 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 Cornelius Moras)  . Varicose veins     Past Surgical History:  Procedure Laterality Date  . CARDIAC CATHETERIZATION  03/26/2003   occluded RCA with collaterals from L to R, 60% mid Cfx disease, 40% branch disease to OM, LAD with 50% stenosis (Dr. Laurell Josephs)   . CHOLECYSTECTOMY    . CORONARY ARTERY BYPASS GRAFT N/A 10/20/2012   Procedure: CORONARY ARTERY BYPASS GRAFTING (CABG);  Surgeon: Purcell Nails, MD;  Location: [facility] OR;  Service: Open Heart Surgery;  Laterality: N/A;  . INTRAOPERATIVE TRANSESOPHAGEAL ECHOCARDIOGRAM N/A 10/20/2012   Procedure: INTRAOPERATIVE TRANSESOPHAGEAL ECHOCARDIOGRAM;  Surgeon: Purcell Nails, MD;  Location: [facility] OR;  Service: Open Heart Surgery;  Laterality: N/A;  . LEFT HEART CATHETERIZATION WITH CORONARY ANGIOGRAM N/A 10/19/2012   Procedure: LEFT HEART CATHETERIZATION WITH CORONARY ANGIOGRAM;  Surgeon: Chrystie Nose, MD;  Location: [facility] CATH LAB;  Service: Cardiovascular;   Laterality: N/A;  . SHOULDER SURGERY  2002    FAMHx:  Family History  Problem Relation Age of Onset  . Coronary artery disease Mother     SOCHx:   reports that he quit smoking about 5 years ago. His smoking use included cigarettes. He smoked 2.00 packs per day. He has never used smokeless tobacco. He reports that he does not drink alcohol or use drugs.  ALLERGIES:  No Known Allergies  ROS: Pertinent items noted in HPI and remainder of comprehensive ROS otherwise negative.  HOME MEDS: Current Outpatient Medications  Medication Sig Dispense Refill  . amLODipine (NORVASC) 5 MG tablet Take 1 tablet (5 mg total) by mouth daily. 90 tablet 3  . aspirin EC 325 MG EC tablet Take 1 tablet (325 mg total) by mouth daily. 30 tablet   . atorvastatin (LIPITOR) 40 MG tablet Take 40 mg by mouth daily.    . metoprolol (LOPRESSOR) 50 MG tablet Take 50 mg by mouth daily.    . mometasone (NASONEX) 50 MCG/ACT nasal spray Place 2 sprays into the nose daily. 17 g  0  . quinapril-hydrochlorothiazide (ACCURETIC) 20-25 MG per tablet Take 0.5 tablets by mouth daily.    Marland Kitchen tiotropium (SPIRIVA) 18 MCG inhalation capsule Place 18 mcg into inhaler and inhale daily as needed.      No current facility-administered medications for this visit.     LABS/IMAGING: No results found for this or any previous visit (from the past 48 hour(s)). No results found.  VITALS: BP 107/67   Pulse (!) 54   Ht  (1.702 m)   Wt 200 lb (90.7 kg)   BMI 31.32 kg/m   EXAM: General appearance: alert and no distress Neck: no carotid bruit and no JVD Lungs: clear to auscultation bilaterally Heart: regular rate and rhythm, S1, S2 normal, no murmur, click, rub or gallop Abdomen: soft, non-tender; bowel sounds normal; no masses,  no organomegaly Extremities: extremities normal, atraumatic, no cyanosis or edema Pulses: 2+ and symmetric Skin: Skin color, texture, turgor normal. No rashes or lesions Neurologic: Grossly  normal  EKG: Sinus bradycardia 54, incomplete right bundle branch block-personally reviewed  ASSESSMENT: 1. Dyspnea 2. Coronary artery disease status post three-vessel CABG in 2014 3. 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 HTN - controlled 5. Dyslipidemia-at goal 6. Solitary pulmonary nodule 7. Bilateral lower extremity edema - resolved 8. COPD  PLAN: 1.   Mr. Morones has some dyspnea which seems to be worse seasonally.  In fact he says over the past week is gotten somewhat better.  He may need additional inhalers but does not want to repeat pulmonary function testing at this time.  He will follow-up with his PCP regarding this.  His main issue of concern was that he had a history of solitary pulmonary nodule.  This is been stable but radiology recommended following every year.  We have not looked since 2016 since he did not follow-up with [name].  We had ordered a repeat study.  I will go ahead and reorder a CT of the chest today.  We will contact him with those results.  I doubt his symptoms are related to his CABG which was in 2014 and he is done very well with.  I would encourage him to follow-up with me annually or sooner as necessary.  Chrystie Nose, MD, [facility], FACP  [place]  [facility]  Medical Director of the [facility]  Cardiovascular Risk Reduction Clinic Diplomate of the American Board of Clinical Lipidology Attending Cardiologist  Direct Dial: [phone]  Fax: [phone]  Website:  [email or web address] 11/29/2017, 3:01 PM

## 2017-12-14 ENCOUNTER — Ambulatory Visit
Admission: RE | Admit: 2017-12-14 | Discharge: 2017-12-14 | Disposition: A | Discharge status: Home / Self Care | Payer: Medicare Other | Source: Ambulatory Visit | Attending: Internal Medicine | Admitting: Internal Medicine

## 2017-12-14 DIAGNOSIS — Z951 Presence of aortocoronary bypass graft: Secondary | ICD-10-CM | POA: Diagnosis not present

## 2017-12-14 DIAGNOSIS — R911 Solitary pulmonary nodule: Secondary | ICD-10-CM | POA: Diagnosis present

## 2017-12-14 DIAGNOSIS — J432 Centrilobular emphysema: Secondary | ICD-10-CM | POA: Insufficient documentation

## 2018-08-11 NOTE — Patient Instructions (Signed)
Your procedure is scheduled on: 08/19/2018  Report to [facility] at  1005  AM.  Call this number if you have problems the morning of surgery: [phone]   Do not eat food or drink liquids :After Midnight.      Take these medicines the morning of surgery with A SIP OF WATER: amlodipine, metoprolol. Use your spiriva before you come.   Do not wear jewelry, make-up or nail polish.  Do not wear lotions, powders, or perfumes. You may wear deodorant.  Do not shave 48 hours prior to surgery.  Do not bring valuables to the hospital.  Contacts, dentures or bridgework may not be worn into surgery.  Leave suitcase in the car. After surgery it may be brought to your room.  For patients admitted to the hospital, checkout time is 11:00 AM the day of discharge.   Patients discharged the day of surgery will not be allowed to drive home.  :     Please read over the following fact sheets that you were given: Coughing and Deep Breathing, Surgical Site Infection Prevention, Anesthesia Post-op Instructions and Care and Recovery After Surgery    Cataract A cataract is a clouding of the lens of the eye. When a lens becomes cloudy, vision is reduced based on the degree and nature of the clouding. Many cataracts reduce vision to some degree. Some cataracts make people more near-sighted as they develop. Other cataracts increase glare. Cataracts that are ignored and become worse can sometimes look white. The white color can be seen through the pupil. CAUSES   Aging. However, cataracts may occur at any age, even in newborns.   Certain drugs.   Trauma to the eye.   Certain diseases such as diabetes.   Specific eye diseases such as chronic inflammation inside the eye or a sudden attack of a rare form of glaucoma.   Inherited or acquired medical problems.  SYMPTOMS   Gradual, progressive drop in vision in the affected eye.   Severe, rapid visual loss. This most often happens when trauma is the cause.    DIAGNOSIS  To detect a cataract, an eye doctor examines the lens. Cataracts are best diagnosed with an exam of the eyes with the pupils enlarged (dilated) by drops.  TREATMENT  For an early cataract, vision may improve by using different eyeglasses or stronger lighting. If that does not help your vision, surgery is the only effective treatment. A cataract needs to be surgically removed when vision loss interferes with your everyday activities, such as driving, reading, or watching TV. A cataract may also have to be removed if it prevents examination or treatment of another eye problem. Surgery removes the cloudy lens and usually replaces it with a substitute lens (intraocular lens, IOL).  At a time when both you and your doctor agree, the cataract will be surgically removed. If you have cataracts in both eyes, only one is usually removed at a time. This allows the operated eye to heal and be out of danger from any possible problems after surgery (such as infection or poor wound healing). In rare cases, a cataract may be doing damage to your eye. In these cases, your caregiver may advise surgical removal right away. The vast majority of people who have cataract surgery have better vision afterward. HOME CARE INSTRUCTIONS  If you are not planning surgery, you may be asked to do the following:  Use different eyeglasses.   Use stronger or brighter lighting.   Ask your  eye doctor about reducing your medicine dose or changing medicines if it is thought that a medicine caused your cataract. Changing medicines does not make the cataract go away on its own.   Become familiar with your surroundings. Poor vision can lead to injury. Avoid bumping into things on the affected side. You are at a higher risk for tripping or falling.   Exercise extreme care when driving or operating machinery.   Wear sunglasses if you are sensitive to bright light or experiencing problems with glare.  SEEK IMMEDIATE MEDICAL CARE  IF:   You have a worsening or sudden vision loss.   You notice redness, swelling, or increasing pain in the eye.   You have a fever.  Document Released: 06/29/2005 Document Revised: 06/18/2011 Document Reviewed: 02/20/2011 [facility] Patient Information 2012 [employer].PATIENT INSTRUCTIONS POST-ANESTHESIA  IMMEDIATELY FOLLOWING SURGERY:  Do not drive or operate machinery for the first twenty four hours after surgery.  Do not make any important decisions for twenty four hours after surgery or while taking narcotic pain medications or sedatives.  If you develop intractable nausea and vomiting or a severe headache please notify your doctor immediately.  FOLLOW-UP:  Please make an appointment with your surgeon as instructed. You do not need to follow up with anesthesia unless specifically instructed to do so.  WOUND CARE INSTRUCTIONS (if applicable):  Keep a dry clean dressing on the anesthesia/puncture wound site if there is drainage.  Once the wound has quit draining you may leave it open to air.  Generally you should leave the bandage intact for twenty four hours unless there is drainage.  If the epidural site drains for more than 36-48 hours please call the anesthesia department.  QUESTIONS?:  Please feel free to call your physician or the hospital operator if you have any questions, and they will be happy to assist you.

## 2018-08-15 ENCOUNTER — Encounter (HOSPITAL_COMMUNITY)
Admission: RE | Admit: 2018-08-15 | Discharge: 2018-08-15 | Disposition: A | Discharge status: Home / Self Care | Payer: Medicare Other | Source: Ambulatory Visit | Attending: Ophthalmology | Admitting: Ophthalmology

## 2018-08-15 ENCOUNTER — Encounter (HOSPITAL_COMMUNITY): Payer: Self-pay

## 2018-08-15 ENCOUNTER — Other Ambulatory Visit: Payer: Self-pay

## 2018-08-15 DIAGNOSIS — Z01812 Encounter for preprocedural laboratory examination: Secondary | ICD-10-CM | POA: Insufficient documentation

## 2018-08-15 HISTORY — DX: Personal history of urinary calculi: Z87.442

## 2018-08-15 HISTORY — DX: Other complications of anesthesia, initial encounter: T88.59XA

## 2018-08-15 HISTORY — DX: Adverse effect of unspecified anesthetic, initial encounter: T41.45XA

## 2018-08-15 LAB — CBC
HCT: 45.3 % (ref 39.0–52.0)
HEMOGLOBIN: 14.8 g/dL (ref 13.0–17.0)
MCH: 29.5 pg (ref 26.0–34.0)
MCHC: 32.7 g/dL (ref 30.0–36.0)
MCV: 90.2 fL (ref 80.0–100.0)
Platelets: 223 10*3/uL (ref 150–400)
RBC: 5.02 MIL/uL (ref 4.22–5.81)
RDW: 12 % (ref 11.5–15.5)
WBC: 9.9 10*3/uL (ref 4.0–10.5)
nRBC: 0 % (ref 0.0–0.2)

## 2018-08-15 LAB — BASIC METABOLIC PANEL
Anion gap: 7 (ref 5–15)
BUN: 17 mg/dL (ref 8–23)
CHLORIDE: 97 mmol/L — AB (ref 98–111)
CO2: 28 mmol/L (ref 22–32)
CREATININE: 0.92 mg/dL (ref 0.61–1.24)
Calcium: 8.6 mg/dL — ABNORMAL LOW (ref 8.9–10.3)
GFR calc Af Amer: >60 mL/min (ref >60)
GFR calc non Af Amer: >60 mL/min (ref >60)
Glucose, Bld: 91 mg/dL (ref 70–99)
Potassium: 3.5 mmol/L (ref 3.5–5.1)
Sodium: 132 mmol/L — ABNORMAL LOW (ref 135–145)

## 2018-08-19 ENCOUNTER — Ambulatory Visit (HOSPITAL_COMMUNITY): Payer: Medicare Other | Admitting: Anesthesiology

## 2018-08-19 ENCOUNTER — Encounter (HOSPITAL_COMMUNITY): Payer: Self-pay | Admitting: *Deleted

## 2018-08-19 ENCOUNTER — Encounter (HOSPITAL_COMMUNITY)
Admission: RE | Disposition: A | Discharge status: Home / Self Care | Payer: Self-pay | Source: Home / Self Care | Attending: Ophthalmology

## 2018-08-19 ENCOUNTER — Ambulatory Visit (HOSPITAL_COMMUNITY)
Admission: RE | Admit: 2018-08-19 | Discharge: 2018-08-19 | Disposition: A | Discharge status: Home / Self Care | Payer: Medicare Other | Attending: Ophthalmology | Admitting: Ophthalmology

## 2018-08-19 DIAGNOSIS — J449 Chronic obstructive pulmonary disease, unspecified: Secondary | ICD-10-CM | POA: Diagnosis not present

## 2018-08-19 DIAGNOSIS — I2510 Atherosclerotic heart disease of native coronary artery without angina pectoris: Secondary | ICD-10-CM | POA: Insufficient documentation

## 2018-08-19 DIAGNOSIS — Z87891 Personal history of nicotine dependence: Secondary | ICD-10-CM | POA: Insufficient documentation

## 2018-08-19 DIAGNOSIS — Z79899 Other long term (current) drug therapy: Secondary | ICD-10-CM | POA: Diagnosis not present

## 2018-08-19 DIAGNOSIS — Z951 Presence of aortocoronary bypass graft: Secondary | ICD-10-CM | POA: Insufficient documentation

## 2018-08-19 DIAGNOSIS — E7800 Pure hypercholesterolemia, unspecified: Secondary | ICD-10-CM | POA: Diagnosis not present

## 2018-08-19 DIAGNOSIS — I10 Essential (primary) hypertension: Secondary | ICD-10-CM | POA: Diagnosis not present

## 2018-08-19 DIAGNOSIS — H259 Unspecified age-related cataract: Secondary | ICD-10-CM | POA: Insufficient documentation

## 2018-08-19 DIAGNOSIS — Z7982 Long term (current) use of aspirin: Secondary | ICD-10-CM | POA: Insufficient documentation

## 2018-08-19 HISTORY — PX: CATARACT EXTRACTION W/PHACO: SHX586

## 2018-08-19 SURGERY — PHACOEMULSIFICATION, CATARACT, WITH IOL INSERTION
Anesthesia: Monitor Anesthesia Care | Site: Eye | Laterality: Right

## 2018-08-19 MED ORDER — LIDOCAINE HCL 3.5 % OP GEL
1.0000 "application " | Freq: Once | OPHTHALMIC | Status: AC
  Administered 2018-08-19: 1 via OPHTHALMIC

## 2018-08-19 MED ORDER — MIDAZOLAM HCL 2 MG/2ML IJ SOLN
INTRAMUSCULAR | Status: AC
  Filled 2018-08-19: qty 2

## 2018-08-19 MED ORDER — PHENYLEPHRINE HCL 2.5 % OP SOLN
1.0000 [drp] | OPHTHALMIC | Status: AC
  Administered 2018-08-19 (×3): 1 [drp] via OPHTHALMIC

## 2018-08-19 MED ORDER — POVIDONE-IODINE 5 % OP SOLN
OPHTHALMIC | Status: DC | PRN
  Administered 2018-08-19: 1 via OPHTHALMIC

## 2018-08-19 MED ORDER — MIDAZOLAM HCL 2 MG/2ML IJ SOLN
INTRAMUSCULAR | Status: DC | PRN
  Administered 2018-08-19 (×2): 1 mg via INTRAVENOUS

## 2018-08-19 MED ORDER — EPINEPHRINE PF 1 MG/ML IJ SOLN
INTRAOCULAR | Status: DC | PRN
  Administered 2018-08-19: 500 mL

## 2018-08-19 MED ORDER — PROVISC 10 MG/ML IO SOLN
INTRAOCULAR | Status: DC | PRN
  Administered 2018-08-19: 0.85 mL via INTRAOCULAR

## 2018-08-19 MED ORDER — BSS IO SOLN
INTRAOCULAR | Status: DC | PRN
  Administered 2018-08-19: 15 mL

## 2018-08-19 MED ORDER — NEOMYCIN-POLYMYXIN-DEXAMETH 3.5-10000-0.1 OP SUSP
OPHTHALMIC | Status: DC | PRN
  Administered 2018-08-19: 2 [drp] via OPHTHALMIC

## 2018-08-19 MED ORDER — SODIUM HYALURONATE 23 MG/ML IO SOLN
INTRAOCULAR | Status: DC | PRN
  Administered 2018-08-19: 0.6 mL via INTRAOCULAR

## 2018-08-19 MED ORDER — CYCLOPENTOLATE-PHENYLEPHRINE 0.2-1 % OP SOLN
1.0000 [drp] | OPHTHALMIC | Status: AC
  Administered 2018-08-19 (×3): 1 [drp] via OPHTHALMIC

## 2018-08-19 MED ORDER — SODIUM CHLORIDE 0.9% FLUSH
10.0000 mL | INTRAVENOUS | Status: DC | PRN
  Administered 2018-08-19 (×2): 3 mL via INTRAVENOUS
  Filled 2018-08-19 (×2): qty 10

## 2018-08-19 MED ORDER — LIDOCAINE HCL (PF) 1 % IJ SOLN
INTRAOCULAR | Status: DC | PRN
  Administered 2018-08-19: 1 mL via OPHTHALMIC

## 2018-08-19 MED ORDER — TETRACAINE HCL 0.5 % OP SOLN
1.0000 [drp] | OPHTHALMIC | Status: AC
  Administered 2018-08-19 (×3): 1 [drp] via OPHTHALMIC

## 2018-08-19 MED ORDER — LACTATED RINGERS IV SOLN: INTRAVENOUS | Status: DC

## 2018-08-19 SURGICAL SUPPLY — 13 items
CLOTH BEACON ORANGE TIMEOUT ST (SAFETY) ×2 IMPLANT
EYE SHIELD UNIVERSAL CLEAR (GAUZE/BANDAGES/DRESSINGS) ×2 IMPLANT
GLOVE BIOGEL PI IND STRL 7.0 (GLOVE) IMPLANT
GLOVE BIOGEL PI INDICATOR 7.0 (GLOVE) ×2
LENS ALC ACRYL/TECN (Ophthalmic Related) ×2 IMPLANT
NDL HYPO 18GX1.5 BLUNT FILL (NEEDLE) IMPLANT
NEEDLE HYPO 18GX1.5 BLUNT FILL (NEEDLE) ×3 IMPLANT
PAD ARMBOARD 7.5X6 YLW CONV (MISCELLANEOUS) ×2 IMPLANT
SYR TB 1ML LL NO SAFETY (SYRINGE) ×2 IMPLANT
TAPE SURG TRANSPORE 1 IN (GAUZE/BANDAGES/DRESSINGS) IMPLANT
TAPE SURGICAL TRANSPORE 1 IN (GAUZE/BANDAGES/DRESSINGS) ×2
VISCOELASTIC ADDITIONAL (OPHTHALMIC RELATED) ×2 IMPLANT
WATER STERILE IRR 250ML POUR (IV SOLUTION) ×2 IMPLANT

## 2018-08-19 NOTE — Op Note (Signed)
Date of procedure: 08/19/18  Pre-operative diagnosis: Visually significant age-related cataract, Right Eye (H25.811)  Post-operative diagnosis: Visually significant age-related cataract, Right Eye  Procedure: Removal of cataract via phacoemulsification and insertion of intra-ocular lens [employer] PCB00  +22.5D into the capsular bag of the Right Eye  Attending surgeon: Gerda Diss. Jarrod Bodkins, MD, MA  Anesthesia: MAC, Topical Akten  Complications: None  Estimated Blood Loss: <45m (minimal)  Specimens: None  Implants: As above  Indications:  Visually significant age-related cataract, Right Eye  Procedure:  The patient was seen and identified in the pre-operative area. The operative eye was identified and dilated.  The operative eye was marked.  Topical anesthesia was administered to the operative eye.     The patient was then to the operative suite and placed in the supine position.  A timeout was performed confirming the patient, procedure to be performed, and all other relevant information.   The patient's face was prepped and draped in the usual fashion for intra-ocular surgery.  A lid speculum was placed into the operative eye and the surgical microscope moved into place and focused.  A superotemporal paracentesis was created using a 20 gauge paracentesis blade.  Shugarcaine was injected into the anterior chamber.  Viscoelastic was injected into the anterior chamber.  A temporal clear-corneal main wound incision was created using a 2.444mmicrokeratome.  A continuous curvilinear capsulorrhexis was initiated using an irrigating cystitome and completed using capsulorrhexis forceps.  Hydrodissection and hydrodeliniation were performed.  Viscoelastic was injected into the anterior chamber.  A phacoemulsification handpiece and a chopper as a second instrument were used to remove the nucleus and epinucleus. The irrigation/aspiration handpiece was used to remove any remaining cortical  material.   The capsular bag was reinflated with viscoelastic, checked, and found to be intact.  The intraocular lens was inserted into the capsular bag and dialed into place using a Kuglen hook.  The irrigation/aspiration handpiece was used to remove any remaining viscoelastic.  The clear corneal wound and paracentesis wounds were then hydrated and checked with Weck-Cels to be watertight.  The lid-speculum and drape was removed, and the patient's face was cleaned with a wet and dry 4x4.  Maxitrol was instilled in the eye before a clear shield was taped over the eye. The patient was taken to the post-operative care unit in good condition, having tolerated the procedure well.  Post-Op Instructions: [facility] and will receive all other orders and instructions.

## 2018-08-19 NOTE — Discharge Instructions (Addendum)
Please discharge patient when stable, will follow up today with Dr. Wrzosek at the [facility] office immediately following discharge.  Leave shield in place until visit.  All paperwork with discharge instructions will be given at the office. ° ° ° ° ° °Monitored Anesthesia Care, Care After °These instructions provide you with information about caring for yourself after your procedure. Your health care provider may also give you more specific instructions. Your treatment has been planned according to current medical practices, but problems sometimes occur. Call your health care provider if you have any problems or questions after your procedure. °What can I expect after the procedure? °After your procedure, you may: °· Feel sleepy for several hours. °· Feel clumsy and have poor balance for several hours. °· Feel forgetful about what happened after the procedure. °· Have poor judgment for several hours. °· Feel nauseous or vomit. °· Have a sore throat if you had a breathing tube during the procedure. °Follow these instructions at home: °For at least 24 hours after the procedure: ° °  ° °· Have a responsible adult stay with you. It is important to have someone help care for you until you are awake and alert. °· Rest as needed. °· Do not: °? Participate in activities in which you could fall or become injured. °? Drive. °? Use heavy machinery. °? Drink alcohol. °? Take sleeping pills or medicines that cause drowsiness. °? Make important decisions or sign legal documents. °? Take care of children on your own. °Eating and drinking °· Follow the diet that is recommended by your health care provider. °· If you vomit, drink water, juice, or soup when you can drink without vomiting. °· Make sure you have little or no nausea before eating solid foods. °General instructions °· Take over-the-counter and prescription medicines only as told by your health care provider. °· If you have sleep apnea, surgery and certain  medicines can increase your risk for breathing problems. Follow instructions from your health care provider about wearing your sleep device: °? Anytime you are sleeping, including during daytime naps. °? While taking prescription pain medicines, sleeping medicines, or medicines that make you drowsy. °· If you smoke, do not smoke without supervision. °· Keep all follow-up visits as told by your health care provider. This is important. °Contact a health care provider if: °· You keep feeling nauseous or you keep vomiting. °· You feel light-headed. °· You develop a rash. °· You have a fever. °Get help right away if: °· You have trouble breathing. °Summary °· For several hours after your procedure, you may feel sleepy and have poor judgment. °· Have a responsible adult stay with you for at least 24 hours or until you are awake and alert. °This information is not intended to replace advice given to you by your health care provider. Make sure you discuss any questions you have with your health care provider. °Document Released: 10/20/2015 Document Revised: 02/12/2017 Document Reviewed: 10/20/2015 °Elsevier Interactive Patient Education © 2019 [employer] ° °

## 2018-08-19 NOTE — Transfer of Care (Signed)
Immediate Anesthesia Transfer of Care Note  Patient: [name]  Procedure(s) Performed: CATARACT EXTRACTION PHACO AND INTRAOCULAR LENS PLACEMENT (IOC) (Right Eye)  Patient Location: Short Stay  Anesthesia Type:MAC  Level of Consciousness: awake, alert  and patient cooperative  Airway & Oxygen Therapy: Patient Spontanous Breathing  Post-op Assessment: Report given to RN and Post -op Vital signs reviewed and stable  Post vital signs: Reviewed and stable  Last Vitals:  Vitals Value Taken Time  BP    Temp    Pulse    Resp    SpO2      Last Pain:  Vitals:   08/19/18 1045  TempSrc: Oral  PainSc: 0-No pain         Complications: No apparent anesthesia complications

## 2018-08-19 NOTE — Anesthesia Postprocedure Evaluation (Signed)
Anesthesia Post Note  Patient: [name]  Procedure(s) Performed: CATARACT EXTRACTION PHACO AND INTRAOCULAR LENS PLACEMENT (IOC) (Right Eye)  Patient location during evaluation: Short Stay Anesthesia Type: MAC Level of consciousness: awake and alert and patient cooperative Pain management: satisfactory to patient Vital Signs Assessment: post-procedure vital signs reviewed and stable Respiratory status: spontaneous breathing Cardiovascular status: stable Postop Assessment: no apparent nausea or vomiting Anesthetic complications: no     Last Vitals:  Vitals:   08/19/18 1045  BP: 128/73  Resp: 18  Temp: 36.6 C  SpO2: (!) 89%    Last Pain:  Vitals:   08/19/18 1045  TempSrc: Oral  PainSc: 0-No pain                 Brandyn Thien

## 2018-08-19 NOTE — Anesthesia Procedure Notes (Signed)
Procedure Name: MAC Date/Time: 08/19/2018 12:13 PM Performed by: Vista Deck, CRNA Pre-anesthesia Checklist: Patient identified, Emergency Drugs available, Suction available, Timeout performed and Patient being monitored Patient Re-evaluated:Patient Re-evaluated prior to induction Oxygen Delivery Method: Nasal Cannula

## 2018-08-19 NOTE — H&P (Signed)
The H and P was reviewed and updated. The patient was examined.  No changes were found after exam.  The surgical eye was marked.  

## 2018-08-19 NOTE — Anesthesia Preprocedure Evaluation (Addendum)
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

## 2018-08-22 ENCOUNTER — Encounter (HOSPITAL_COMMUNITY): Payer: Self-pay | Admitting: Ophthalmology

## 2018-08-26 ENCOUNTER — Encounter (HOSPITAL_COMMUNITY): Payer: Self-pay

## 2018-08-29 ENCOUNTER — Encounter (HOSPITAL_COMMUNITY)
Admission: RE | Admit: 2018-08-29 | Discharge: 2018-08-29 | Disposition: A | Discharge status: Home / Self Care | Payer: Medicare Other | Source: Ambulatory Visit | Attending: Ophthalmology | Admitting: Ophthalmology

## 2018-09-01 MED ORDER — PROPOFOL 10 MG/ML IV BOLUS
INTRAVENOUS | Status: AC
  Filled 2018-09-01: qty 20

## 2018-09-01 MED ORDER — KETAMINE HCL 50 MG/5ML IJ SOSY
PREFILLED_SYRINGE | INTRAMUSCULAR | Status: AC
  Filled 2018-09-01: qty 5

## 2018-09-23 ENCOUNTER — Encounter (HOSPITAL_COMMUNITY)
Admission: RE | Admit: 2018-09-23 | Discharge: 2018-09-23 | Disposition: A | Discharge status: Home / Self Care | Payer: Medicare Other | Source: Ambulatory Visit | Attending: Ophthalmology | Admitting: Ophthalmology

## 2018-09-23 ENCOUNTER — Encounter (HOSPITAL_COMMUNITY): Payer: Self-pay

## 2018-09-23 ENCOUNTER — Other Ambulatory Visit: Payer: Self-pay

## 2018-09-27 ENCOUNTER — Ambulatory Visit (HOSPITAL_COMMUNITY)
Admission: RE | Admit: 2018-09-27 | Discharge: 2018-09-27 | Disposition: A | Discharge status: Home / Self Care | Payer: Medicare Other | Attending: Ophthalmology | Admitting: Ophthalmology

## 2018-09-27 ENCOUNTER — Other Ambulatory Visit: Payer: Self-pay

## 2018-09-27 ENCOUNTER — Encounter (HOSPITAL_COMMUNITY): Payer: Self-pay | Admitting: *Deleted

## 2018-09-27 ENCOUNTER — Ambulatory Visit (HOSPITAL_COMMUNITY): Payer: Medicare Other | Admitting: Anesthesiology

## 2018-09-27 ENCOUNTER — Encounter (HOSPITAL_COMMUNITY)
Admission: RE | Disposition: A | Discharge status: Home / Self Care | Payer: Self-pay | Source: Home / Self Care | Attending: Ophthalmology

## 2018-09-27 DIAGNOSIS — Z951 Presence of aortocoronary bypass graft: Secondary | ICD-10-CM | POA: Diagnosis not present

## 2018-09-27 DIAGNOSIS — H2512 Age-related nuclear cataract, left eye: Secondary | ICD-10-CM | POA: Diagnosis present

## 2018-09-27 DIAGNOSIS — J449 Chronic obstructive pulmonary disease, unspecified: Secondary | ICD-10-CM | POA: Insufficient documentation

## 2018-09-27 DIAGNOSIS — Z79899 Other long term (current) drug therapy: Secondary | ICD-10-CM | POA: Diagnosis not present

## 2018-09-27 DIAGNOSIS — I10 Essential (primary) hypertension: Secondary | ICD-10-CM | POA: Diagnosis not present

## 2018-09-27 DIAGNOSIS — I252 Old myocardial infarction: Secondary | ICD-10-CM | POA: Insufficient documentation

## 2018-09-27 DIAGNOSIS — I2510 Atherosclerotic heart disease of native coronary artery without angina pectoris: Secondary | ICD-10-CM | POA: Insufficient documentation

## 2018-09-27 DIAGNOSIS — Z87891 Personal history of nicotine dependence: Secondary | ICD-10-CM | POA: Diagnosis not present

## 2018-09-27 DIAGNOSIS — Z7982 Long term (current) use of aspirin: Secondary | ICD-10-CM | POA: Diagnosis not present

## 2018-09-27 HISTORY — PX: CATARACT EXTRACTION W/PHACO: SHX586

## 2018-09-27 SURGERY — PHACOEMULSIFICATION, CATARACT, WITH IOL INSERTION
Anesthesia: Monitor Anesthesia Care | Site: Eye | Laterality: Left

## 2018-09-27 MED ORDER — NEOMYCIN-POLYMYXIN-DEXAMETH 3.5-10000-0.1 OP SUSP
OPHTHALMIC | Status: DC | PRN
  Administered 2018-09-27: 1 [drp] via OPHTHALMIC

## 2018-09-27 MED ORDER — EPINEPHRINE PF 1 MG/ML IJ SOLN
INTRAMUSCULAR | Status: AC
  Filled 2018-09-27: qty 2

## 2018-09-27 MED ORDER — BSS IO SOLN
INTRAOCULAR | Status: DC | PRN
  Administered 2018-09-27: 15 mL via INTRAOCULAR

## 2018-09-27 MED ORDER — EPINEPHRINE PF 1 MG/ML IJ SOLN
INTRAOCULAR | Status: DC | PRN
  Administered 2018-09-27: 500 mL

## 2018-09-27 MED ORDER — MIDAZOLAM HCL 2 MG/2ML IJ SOLN
INTRAMUSCULAR | Status: AC
  Filled 2018-09-27: qty 2

## 2018-09-27 MED ORDER — SODIUM HYALURONATE 23 MG/ML IO SOLN
INTRAOCULAR | Status: DC | PRN
  Administered 2018-09-27: 0.6 mL via INTRAOCULAR

## 2018-09-27 MED ORDER — PROVISC 10 MG/ML IO SOLN
INTRAOCULAR | Status: DC | PRN
  Administered 2018-09-27: 0.85 mL via INTRAOCULAR

## 2018-09-27 MED ORDER — LIDOCAINE HCL 3.5 % OP GEL
1.0000 "application " | Freq: Once | OPHTHALMIC | Status: AC
  Administered 2018-09-27: 1 via OPHTHALMIC

## 2018-09-27 MED ORDER — CYCLOPENTOLATE-PHENYLEPHRINE 0.2-1 % OP SOLN
1.0000 [drp] | OPHTHALMIC | Status: AC
  Administered 2018-09-27 (×3): 1 [drp] via OPHTHALMIC

## 2018-09-27 MED ORDER — POVIDONE-IODINE 5 % OP SOLN
OPHTHALMIC | Status: DC | PRN
  Administered 2018-09-27: 1 via OPHTHALMIC

## 2018-09-27 MED ORDER — LIDOCAINE HCL (PF) 1 % IJ SOLN
INTRAOCULAR | Status: DC | PRN
  Administered 2018-09-27: 1 mL via OPHTHALMIC

## 2018-09-27 MED ORDER — PHENYLEPHRINE HCL 2.5 % OP SOLN
1.0000 [drp] | OPHTHALMIC | Status: AC
  Administered 2018-09-27 (×3): 1 [drp] via OPHTHALMIC

## 2018-09-27 MED ORDER — MIDAZOLAM HCL 5 MG/5ML IJ SOLN
INTRAMUSCULAR | Status: DC | PRN
  Administered 2018-09-27 (×2): 1 mg via INTRAVENOUS

## 2018-09-27 MED ORDER — TETRACAINE HCL 0.5 % OP SOLN
1.0000 [drp] | OPHTHALMIC | Status: AC
  Administered 2018-09-27 (×3): 1 [drp] via OPHTHALMIC

## 2018-09-27 SURGICAL SUPPLY — 13 items
CLOTH BEACON ORANGE TIMEOUT ST (SAFETY) ×2 IMPLANT
EYE SHIELD UNIVERSAL CLEAR (GAUZE/BANDAGES/DRESSINGS) ×2 IMPLANT
GLOVE BIOGEL PI IND STRL 6.5 (GLOVE) IMPLANT
GLOVE BIOGEL PI INDICATOR 6.5 (GLOVE) ×4
LENS ALC ACRYL/TECN (Ophthalmic Related) ×2 IMPLANT
NDL HYPO 18GX1.5 BLUNT FILL (NEEDLE) IMPLANT
NEEDLE HYPO 18GX1.5 BLUNT FILL (NEEDLE) ×3 IMPLANT
PAD ARMBOARD 7.5X6 YLW CONV (MISCELLANEOUS) ×2 IMPLANT
SYR TB 1ML LL NO SAFETY (SYRINGE) ×2 IMPLANT
TAPE SURG TRANSPORE 1 IN (GAUZE/BANDAGES/DRESSINGS) IMPLANT
TAPE SURGICAL TRANSPORE 1 IN (GAUZE/BANDAGES/DRESSINGS) ×2
VISCOELASTIC ADDITIONAL (OPHTHALMIC RELATED) ×2 IMPLANT
WATER STERILE IRR 250ML POUR (IV SOLUTION) ×2 IMPLANT

## 2018-09-27 NOTE — Anesthesia Preprocedure Evaluation (Signed)
Anesthesia Evaluation  Patient identified by MRN, date of birth, ID band Patient awake    Reviewed: Allergy & Precautions, NPO status , Patient's Chart, lab work & pertinent test results  Airway Mallampati: II  TM Distance: >3 FB Neck ROM: Full    Dental no notable dental hx.    Pulmonary shortness of breath and with exertion, COPD,  COPD inhaler, former smoker,  Known lung nodule -   Pulmonary exam normal breath sounds clear to auscultation       Cardiovascular Exercise Tolerance: Good hypertension, + angina + CAD, + Past MI and + CABG  Normal cardiovascular examI Rhythm:Regular Rate:Normal     Neuro/Psych negative neurological ROS  negative psych ROS   GI/Hepatic negative GI ROS, Neg liver ROS,   Endo/Other  negative endocrine ROS  Renal/GU negative Renal ROS  negative genitourinary   Musculoskeletal negative musculoskeletal ROS (+)   Abdominal   Peds negative pediatric ROS (+)  Hematology negative hematology ROS (+)   Anesthesia Other Findings   Reproductive/Obstetrics negative OB ROS                             Anesthesia Physical Anesthesia Plan  ASA: IV  Anesthesia Plan: MAC   Post-op Pain Management:    Induction: Intravenous  PONV Risk Score and Plan:   Airway Management Planned: Nasal Cannula  Additional Equipment:   Intra-op Plan:   Post-operative Plan:   Informed Consent: I have reviewed the patients History and Physical, chart, labs and discussed the procedure including the risks, benefits and alternatives for the proposed anesthesia with the patient or authorized representative who has indicated his/her understanding and acceptance.     Dental advisory given  Plan Discussed with: CRNA  Anesthesia Plan Comments:         Anesthesia Quick Evaluation

## 2018-09-27 NOTE — Discharge Instructions (Signed)
Please discharge patient when stable, will [facility].  Leave shield in place until visit.  All paperwork with discharge instructions will be given at the office. ° °

## 2018-09-27 NOTE — Transfer of Care (Signed)
Immediate Anesthesia Transfer of Care Note  Patient: [name]  Procedure(s) Performed: CATARACT EXTRACTION PHACO AND INTRAOCULAR LENS PLACEMENT LEFT EYE  (CDE: 5.11) (Left Eye)  Patient Location: Short Stay  Anesthesia Type:MAC  Level of Consciousness: awake, alert , oriented and patient cooperative  Airway & Oxygen Therapy: Patient Spontanous Breathing  Post-op Assessment: Report given to RN and Post -op Vital signs reviewed and stable  Post vital signs: Reviewed and stable  Last Vitals:  Vitals Value Taken Time  BP    Temp    Pulse    Resp    SpO2      Last Pain:  Vitals:   09/27/18 1002  TempSrc: Oral  PainSc: 0-No pain      Patients Stated Pain Goal: 7 (31/49/70 2637)  Complications: No apparent anesthesia complications

## 2018-09-27 NOTE — H&P (Signed)
The H and P was reviewed and updated. The patient was examined.  No changes were found after exam.  The surgical eye was marked.  

## 2018-09-27 NOTE — Anesthesia Postprocedure Evaluation (Signed)
Anesthesia Post Note  Patient: [name]  Procedure(s) Performed: CATARACT EXTRACTION PHACO AND INTRAOCULAR LENS PLACEMENT LEFT EYE  (CDE: 5.11) (Left Eye)  Patient location during evaluation: Short Stay Anesthesia Type: MAC Level of consciousness: awake and alert and oriented Pain management: pain level controlled Vital Signs Assessment: post-procedure vital signs reviewed and stable Respiratory status: spontaneous breathing Cardiovascular status: stable Postop Assessment: no apparent nausea or vomiting Anesthetic complications: no     Last Vitals:  Vitals:   09/27/18 1002 09/27/18 1123  BP: 115/64 (!) 103/47  Pulse: (!) 50 (!) 49  Resp: 20   Temp: 36.4 C 36.4 C  SpO2: 98% 98%    Last Pain:  Vitals:   09/27/18 1123  TempSrc: Oral  PainSc: 0-No pain                 ADAMS, AMY A

## 2018-09-27 NOTE — Anesthesia Procedure Notes (Signed)
Procedure Name: [place] Performed by: Andree Elk Lambert Jeanty A, CRNA Pre-anesthesia Checklist: Patient identified, Emergency Drugs available, Suction available, Timeout performed and Patient being monitored Patient Re-evaluated:Patient Re-evaluated prior to induction Oxygen Delivery Method: Nasal Cannula

## 2018-09-27 NOTE — Op Note (Signed)
Date of procedure: 09/27/18  Pre-operative diagnosis: Visually significant age-related cataract, Left Eye (H25.812)  Post-operative diagnosis: Visually significant age-related cataract, Left Eye  Procedure: Removal of cataract via phacoemulsification and insertion of intra-ocular lens [employer] PCB00  +22.5D into the capsular bag of the Left Eye  Attending surgeon: Gerda Diss. Tobie Hellen, MD, MA  Anesthesia: MAC, Topical Akten  Complications: None  Estimated Blood Loss: <29m (minimal)  Specimens: None  Implants: As above  Indications:  Visually significant age-related cataract, Left Eye  Procedure:  The patient was seen and identified in the pre-operative area. The operative eye was identified and dilated.  The operative eye was marked.  Topical anesthesia was administered to the operative eye.     The patient was then to the operative suite and placed in the supine position.  A timeout was performed confirming the patient, procedure to be performed, and all other relevant information.   The patient's face was prepped and draped in the usual fashion for intra-ocular surgery.  A lid speculum was placed into the operative eye and the surgical microscope moved into place and focused.  An inferotemporal paracentesis was created using a 20 gauge paracentesis blade.  Shugarcaine was injected into the anterior chamber.  Viscoelastic was injected into the anterior chamber.  A temporal clear-corneal main wound incision was created using a 2.47mmicrokeratome.  A continuous curvilinear capsulorrhexis was initiated using an irrigating cystitome and completed using capsulorrhexis forceps.  Hydrodissection and hydrodeliniation were performed.  Viscoelastic was injected into the anterior chamber.  A phacoemulsification handpiece and a chopper as a second instrument were used to remove the nucleus and epinucleus. The irrigation/aspiration handpiece was used to remove any remaining cortical  material.   The capsular bag was reinflated with viscoelastic, checked, and found to be intact.  The intraocular lens was inserted into the capsular bag and dialed into place using a Kuglen hook.  The irrigation/aspiration handpiece was used to remove any remaining viscoelastic.  The clear corneal wound and paracentesis wounds were then hydrated and checked with Weck-Cels to be watertight.  The lid-speculum and drape was removed, and the patient's face was cleaned with a wet and dry 4x4.  Maxitrol was instilled in the eye before a clear shield was taped over the eye. The patient was taken to the post-operative care unit in good condition, having tolerated the procedure well.  Post-Op Instructions: [facility] and will receive all other orders and instructions.

## 2018-09-28 ENCOUNTER — Encounter (HOSPITAL_COMMUNITY): Payer: Self-pay | Admitting: Ophthalmology

## 2019-03-02 ENCOUNTER — Telehealth: Payer: Self-pay

## 2019-03-02 NOTE — Telephone Encounter (Signed)
Called patient to discuss upcoming appointment with Dr [name]. Spoke with wife. She confirmed that they were okay with completing virtual visit next week over the phone.     Virtual Visit Pre-Appointment Phone Call  "(Name), I am calling you today to discuss your upcoming appointment. We are currently trying to limit exposure to the virus that causes COVID-19 by seeing patients at home rather than in the office."  1. "What is the BEST phone number to call the day of the visit?" - include this in appointment notes  2. "Do you have or have access to (through a family member/friend) a smartphone with video capability that we can use for your visit?" a. If yes - list this number in appt notes as "cell" (if different from BEST phone #) and list the appointment type as a VIDEO visit in appointment notes b. If no - list the appointment type as a PHONE visit in appointment notes  3. Confirm consent - "In the setting of the current Covid19 crisis, you are scheduled for a (phone or video) visit with your provider on (date) at (time).  Just as we do with many in-office visits, in order for you to participate in this visit, we must obtain consent.  If you'd like, I can send this to your mychart (if signed up) or email for you to review.  Otherwise, I can obtain your verbal consent now.  All virtual visits are billed to your insurance company just like a normal visit would be.  By agreeing to a virtual visit, we'd like you to understand that the technology does not allow for your provider to perform an examination, and thus may limit your provider's ability to fully assess your condition. If your provider identifies any concerns that need to be evaluated in person, we will make arrangements to do so.  Finally, though the technology is pretty good, we cannot assure that it will always work on either your or our end, and in the setting of a video visit, we may have to convert it to a phone-only visit.  In either  situation, we cannot ensure that we have a secure connection.  Are you willing to proceed?" STAFF: Did the patient verbally acknowledge consent to telehealth visit? Document YES/NO here: YES  4. Advise patient to be prepared - "Two hours prior to your appointment, go ahead and check your blood pressure, pulse, oxygen saturation, and your weight (if you have the equipment to check those) and write them all down. When your visit starts, your provider will ask you for this information. If you have an Apple Watch or Kardia device, please plan to have heart rate information ready on the day of your appointment. Please have a pen and paper handy nearby the day of the visit as well."  5. Give patient instructions for MyChart download to smartphone OR Doximity/Doxy.me as below if video visit (depending on what platform provider is using)  6. Inform patient they will receive a phone call 15 minutes prior to their appointment time (may be from unknown caller ID) so they should be prepared to answer    TELEPHONE CALL NOTE  [name] has been deemed a candidate for a follow-up tele-health visit to limit community exposure during the Covid-19 pandemic. I spoke with the patient via phone to ensure availability of phone/video source, confirm preferred email & phone number, and discuss instructions and expectations.  I reminded JAHMAD PETRICH to be prepared with any vital sign and/or  heart rhythm information that could potentially be obtained via home monitoring, at the time of his visit. I reminded Jim LikeGary F Bousquet to expect a phone call prior to his visit.  Ian Bushmanruitt, [place], CMA 03/02/2019 10:25 AM   INSTRUCTIONS FOR DOWNLOADING THE MYCHART APP TO SMARTPHONE  - The patient must first make sure to have activated MyChart and know their login information - If Apple, go to [employer] Store and type in MyChart in the search bar and download the app. If Android, ask patient to go to [employer] Play Store and type in [place] in  the search bar and download the app. The app is free but as with any other app downloads, their phone may require them to verify saved payment information or Apple/Android password.  - The patient will need to then log into the app with their MyChart username and password, and select [facility] as their healthcare provider to link the account. When it is time for your visit, go to the MyChart app, find appointments, and click Begin Video Visit. Be sure to Select Allow for your device to access the Microphone and Camera for your visit. You will then be connected, and your provider will be with you shortly.  **If they have any issues connecting, or need assistance please contact MyChart service desk [phone])**  **If using a computer, in order to ensure the best quality for their visit they will need to use either of the following Internet Browsers: [employer] Edge, or Google Chrome**  IF USING DOXIMITY or DOXY.ME - The patient will receive a link just prior to their visit by text.     FULL LENGTH CONSENT FOR TELE-HEALTH VISIT   I hereby voluntarily request, consent and authorize [facility] and its employed or contracted physicians, physician assistants, nurse practitioners or other licensed health care professionals (the Practitioner), to provide me with telemedicine health care services (the "Services") as deemed necessary by the treating Practitioner. I acknowledge and consent to receive the Services by the Practitioner via telemedicine. I understand that the telemedicine visit will involve communicating with the Practitioner through live audiovisual communication technology and the disclosure of certain medical information by electronic transmission. I acknowledge that I have been given the opportunity to request an in-person assessment or other available alternative prior to the telemedicine visit and am voluntarily participating in the telemedicine visit.  I understand that  I have the right to withhold or withdraw my consent to the use of telemedicine in the course of my care at any time, without affecting my right to future care or treatment, and that the Practitioner or I may terminate the telemedicine visit at any time. I understand that I have the right to inspect all information obtained and/or recorded in the course of the telemedicine visit and may receive copies of available information for a reasonable fee.  I understand that some of the potential risks of receiving the Services via telemedicine include:  Marland Kitchen. Delay or interruption in medical evaluation due to technological equipment failure or disruption; . Information transmitted may not be sufficient (e.g. poor resolution of images) to allow for appropriate medical decision making by the Practitioner; and/or  . In rare instances, security protocols could fail, causing a breach of personal health information.  Furthermore, I acknowledge that it is my responsibility to provide information about my medical history, conditions and care that is complete and accurate to the best of my ability. I acknowledge that Practitioner's advice, recommendations, and/or decision may be based  on factors not within their control, such as incomplete or inaccurate data provided by me or distortions of diagnostic images or specimens that may result from electronic transmissions. I understand that the practice of medicine is not an exact science and that Practitioner makes no warranties or guarantees regarding treatment outcomes. I acknowledge that I will receive a copy of this consent concurrently upon execution via email to the email address I last provided but may also request a printed copy by calling the office of [facility].    I understand that my insurance will be billed for this visit.   I have read or had this consent read to me. . I understand the contents of this consent, which adequately explains the benefits and risks of  the Services being provided via telemedicine.  . I have been provided ample opportunity to ask questions regarding this consent and the Services and have had my questions answered to my satisfaction. . I give my informed consent for the services to be provided through the use of telemedicine in my medical care  By participating in this telemedicine visit I agree to the above.

## 2019-03-09 ENCOUNTER — Telehealth (INDEPENDENT_AMBULATORY_CARE_PROVIDER_SITE_OTHER): Payer: Medicare Other | Admitting: Internal Medicine

## 2019-03-09 VITALS — BP 115/65 | HR 54 | Ht 67.0 in | Wt 198.0 lb

## 2019-03-09 DIAGNOSIS — E782 Mixed hyperlipidemia: Secondary | ICD-10-CM

## 2019-03-09 DIAGNOSIS — E785 Hyperlipidemia, unspecified: Secondary | ICD-10-CM

## 2019-03-09 DIAGNOSIS — J449 Chronic obstructive pulmonary disease, unspecified: Secondary | ICD-10-CM | POA: Diagnosis not present

## 2019-03-09 DIAGNOSIS — I10 Essential (primary) hypertension: Secondary | ICD-10-CM | POA: Diagnosis not present

## 2019-03-09 DIAGNOSIS — R0600 Dyspnea, unspecified: Secondary | ICD-10-CM

## 2019-03-09 DIAGNOSIS — I2510 Atherosclerotic heart disease of native coronary artery without angina pectoris: Secondary | ICD-10-CM | POA: Diagnosis not present

## 2019-03-09 DIAGNOSIS — J438 Other emphysema: Secondary | ICD-10-CM

## 2019-03-09 NOTE — Patient Instructions (Signed)
Medication Instructions:  Dr Hilty recommends that you continue on your current medications as directed. Please refer to the Current Medication list given to you today.  If you need a refill on your cardiac medications before your next appointment, please call your pharmacy.   Follow-Up: At [facility], you and your health needs are our priority.  As part of our continuing mission to provide you with exceptional heart care, we have created designated Provider Care Teams.  These Care Teams include your primary Cardiologist (physician) and Advanced Practice Providers (APPs -  Physician Assistants and Nurse Practitioners) who all work together to provide you with the care you need, when you need it. You will need a follow up appointment in 12 months.  Please call our office 2 months in advance to schedule this appointment.  You may see Kenneth C Hilty, MD or one of the following Advanced Practice Providers on your designated Care Team: Hao Meng, PA-C . Keano Guggenheim Duke, PA-C . You will receive a reminder letter in the mail two months in advance. If you don't receive a letter, please call our office to schedule the follow-up appointment. 

## 2019-03-09 NOTE — Progress Notes (Signed)
Virtual Visit via Telephone Note   This visit type was conducted due to national recommendations for restrictions regarding the COVID-19 Pandemic (e.g. social distancing) in an effort to limit this patient's exposure and mitigate transmission in our community.  Due to his co-morbid illnesses, this patient is at least at moderate risk for complications without adequate follow up.  This format is felt to be most appropriate for this patient at this time.  The patient did not have access to video technology/had technical difficulties with video requiring transitioning to audio format only (telephone).  All issues noted in this document were discussed and addressed.  No physical exam could be performed with this format.  Please refer to the patient's chart for his  consent to telehealth for [facility]   Evaluation Performed:  Telephone follow-up  Date:  03/09/2019   ID:  Levi, [name] 1945-10-23, MRN [id number]  Patient Location:  [place] [address]  Provider location:   [address] [place], [place] [address]  PCP:  Marguerita Merles, MD  Cardiologist:  Pixie Casino, MD Electrophysiologist:  None   Chief Complaint:  Shortness of breath  History of Present Illness:    [name] is a 73 y.o. male who presents via audio/video conferencing for a telehealth visit today.  Mr. Rajan is a pleasant 73 year old male I followed for a long time with a history of coronary artery disease status post three-vessel CABG in 2014, COPD with progressive dyspnea, hypertension, dyslipidemia and previous sternal pain which is improved.  Overall he says he is doing well.  Denies any new chest pain.  He has had some progressive worsening of shortness of breath.  He was switched recently to Advair by his PCP.  He is only been on it a few days so he cannot say whether it is helping him.  Ultimately he may need to see pulmonary and perhaps might be a candidate for one of  the newer inhalers such as Trelegy.  He recently saw his PCP who did labs including lipid profile and other labs which were within normal limits.  We will request those values.  Blood pressure appears well controlled today and heart rates in the 50s.  The patient does not have symptoms concerning for COVID-19 infection (fever, chills, cough, or new SHORTNESS OF BREATH).    Prior CV studies:   The following studies were reviewed today:  Chart reviewed  PMHx:  Past Medical History:  Diagnosis Date  . Complication of anesthesia    patient thinks he has some breathing difficulties after anesthesia  . COPD, severe ([place]) 10/20/2012  . Coronary artery disease   . Dyslipidemia 10/18/2012  . History of kidney stones   . Hypertension   . 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 Roxy Manns)  . Varicose veins     Past Surgical History:  Procedure Laterality Date  . CARDIAC CATHETERIZATION  03/26/2003   occluded RCA with collaterals from L to R, 60% mid Cfx disease, 40% branch disease to OM, LAD with 50% stenosis (Dr. Gerrie Nordmann)   . CATARACT EXTRACTION W/PHACO Right 08/19/2018   Procedure: CATARACT EXTRACTION PHACO AND INTRAOCULAR LENS PLACEMENT ([place]);  Surgeon: Baruch Goldmann, MD;  Location: [facility];  Service: Ophthalmology;  Laterality: Right;  CDE: 9.14  . CATARACT EXTRACTION W/PHACO Left 09/27/2018   Procedure: CATARACT EXTRACTION PHACO AND  INTRAOCULAR LENS PLACEMENT LEFT EYE  (CDE: 5.11);  Surgeon: Fabio PierceWrzosek, James, MD;  Location: [facility];  Service: Ophthalmology;  Laterality: Left;  . CHOLECYSTECTOMY    . CORONARY ARTERY BYPASS GRAFT N/A 10/20/2012   Procedure: CORONARY ARTERY BYPASS GRAFTING (CABG);  Surgeon: Purcell Nailslarence H [name], MD;  [facility];  Service: Open Heart Surgery;  Laterality: N/A;  . INTRAOPERATIVE TRANSESOPHAGEAL ECHOCARDIOGRAM N/A 10/20/2012   Procedure: INTRAOPERATIVE TRANSESOPHAGEAL ECHOCARDIOGRAM;  Surgeon: Purcell Nailslarence H [name], MD;   [facility];  Service: Open Heart Surgery;  Laterality: N/A;  . LEFT HEART CATHETERIZATION WITH CORONARY ANGIOGRAM N/A 10/19/2012   Procedure: LEFT HEART CATHETERIZATION WITH CORONARY ANGIOGRAM;  Surgeon: Chrystie NoseKenneth C. , MD;  [facility];  Service: Cardiovascular;  Laterality: N/A;  . SHOULDER SURGERY  2002    FAMHx:  Family History  Problem Relation Age of Onset  . Coronary artery disease Mother     SOCHx:   reports that he quit smoking about 6 years ago. His smoking use included cigarettes. He smoked 2.00 packs per day. He has never used smokeless tobacco. He reports that he does not drink alcohol or use drugs.  ALLERGIES:  No Known Allergies  MEDS:  Current Meds  Medication Sig  . amLODipine (NORVASC) 5 MG tablet Take 1 tablet (5 mg total) by mouth daily.  Marland Kitchen. aspirin EC 81 MG tablet Take 81 mg by mouth daily.  Marland Kitchen. atorvastatin (LIPITOR) 40 MG tablet Take 40 mg by mouth daily.  . Fluticasone-Salmeterol (ADVAIR) 250-50 MCG/DOSE AEPB Inhale 1 puff into the lungs 2 (two) times daily.  . metoprolol (LOPRESSOR) 50 MG tablet Take 25 mg by mouth 2 (two) times daily.   . quinapril-hydrochlorothiazide (ACCURETIC) 20-25 MG per tablet Take 1 tablet by mouth daily.   Marland Kitchen. tiotropium (SPIRIVA) 18 MCG inhalation capsule Place 18 mcg into inhaler and inhale daily.      ROS: Pertinent items noted in HPI and remainder of comprehensive ROS otherwise negative.  Labs/Other Tests and Data Reviewed:    Recent Labs: 08/15/2018: BUN 17; Creatinine, Ser 0.92; Hemoglobin 14.8; Platelets 223; Potassium 3.5; Sodium 132   Recent Lipid Panel Lab Results  Component Value Date/Time   CHOL 117 10/19/2012 04:55 AM   TRIG 83 10/19/2012 04:55 AM   HDL 33 (L) 10/19/2012 04:55 AM   CHOLHDL 3.5 10/19/2012 04:55 AM   LDLCALC 67 10/19/2012 04:55 AM    Wt Readings from Last 3 Encounters:  03/09/19 198 lb (89.8 kg)  09/27/18 198 lb 6.6 oz (90 kg)  08/15/18 200 lb (90.7 kg)     Exam:    Vital  Signs:  BP 115/65   Pulse (!) 54   Ht 5\' 7"  (1.702 m)   Wt 198 lb (89.8 kg)   SpO2 92%   BMI 31.01 kg/m    Exam not performed due to telephone visit  ASSESSMENT & PLAN:    1. Dyspnea 2. Coronary artery disease status post three-vessel CABG in 2014 3. 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 HTN - controlled 5. Dyslipidemia-at goal 6. Solitary pulmonary nodule 7. Bilateral lower extremity edema - resolved 8. COPD  Mr. Katrinka BlazingSmith seems to be doing well from a cardiac standpoint although has had progressive dyspnea which I suspect is worsening COPD.  He was recently placed on Advair.  Hopefully this will be more helpful for him.  He may need a pulmonary evaluation.  Vitals and labs are all well controlled at this point.  No changes  to his meds today.  Plan follow-up with me annually or sooner as necessary.  COVID-19 Education: The signs and symptoms of COVID-19 were discussed with the patient and how to seek care for testing (follow up with PCP or arrange E-visit).  The importance of social distancing was discussed today.  Patient Risk:   After full review of this patients clinical status, I feel that they are at least moderate risk at this time.  Time:   Today, I have spent 25 minutes with the patient with telehealth technology discussing coronary disease, dyspnea, COPD, hypertension, dyslipidemia.     Medication Adjustments/Labs and Tests Ordered: Current medicines are reviewed at length with the patient today.  Concerns regarding medicines are outlined above.   Tests Ordered: No orders of the defined types were placed in this encounter.   Medication Changes: No orders of the defined types were placed in this encounter.   Disposition:  in 1 year(s)  Chrystie Nose, MD, [facility], FACP  [place]  [facility]  Medical Director of the Advanced Lipid Disorders &  Cardiovascular Risk Reduction Clinic Diplomate of the American Board of Clinical Lipidology  Attending Cardiologist  Direct Dial: [phone]  Fax: [phone]  Website:  [email or web address]  Chrystie Nose, MD  03/09/2019 9:36 AM

## 2020-03-08 ENCOUNTER — Encounter: Payer: Self-pay | Admitting: Internal Medicine

## 2020-03-08 ENCOUNTER — Ambulatory Visit (INDEPENDENT_AMBULATORY_CARE_PROVIDER_SITE_OTHER): Payer: Medicare Other | Admitting: Internal Medicine

## 2020-03-08 ENCOUNTER — Other Ambulatory Visit: Payer: Self-pay

## 2020-03-08 VITALS — BP 124/72 | HR 59 | Ht 67.0 in | Wt 208.6 lb

## 2020-03-08 DIAGNOSIS — E782 Mixed hyperlipidemia: Secondary | ICD-10-CM

## 2020-03-08 DIAGNOSIS — I10 Essential (primary) hypertension: Secondary | ICD-10-CM

## 2020-03-08 DIAGNOSIS — R0600 Dyspnea, unspecified: Secondary | ICD-10-CM

## 2020-03-08 DIAGNOSIS — I2510 Atherosclerotic heart disease of native coronary artery without angina pectoris: Secondary | ICD-10-CM

## 2020-03-08 DIAGNOSIS — J449 Chronic obstructive pulmonary disease, unspecified: Secondary | ICD-10-CM

## 2020-03-08 MED ORDER — METOPROLOL TARTRATE 25 MG PO TABS: 25.0000 mg | ORAL_TABLET | Freq: Two times a day (BID) | ORAL | 3 refills | Status: DC

## 2020-03-08 MED ORDER — AMLODIPINE BESYLATE 2.5 MG PO TABS: 2.5000 mg | ORAL_TABLET | Freq: Every day | ORAL | 3 refills | Status: DC

## 2020-03-08 MED ORDER — QUINAPRIL-HYDROCHLOROTHIAZIDE 10-12.5 MG PO TABS: 1.0000 | ORAL_TABLET | Freq: Every day | ORAL | 3 refills | Status: DC

## 2020-03-08 NOTE — Patient Instructions (Signed)
Medication Instructions:  Continue all current medications  *If you need a refill on your cardiac medications before your next appointment, please call your pharmacy*   Follow-Up: At [facility], you and your health needs are our priority.  As part of our continuing mission to provide you with exceptional heart care, we have created designated Provider Care Teams.  These Care Teams include your primary Cardiologist (physician) and Advanced Practice Providers (APPs -  Physician Assistants and Nurse Practitioners) who all work together to provide you with the care you need, when you need it.  We recommend signing up for the patient portal called "MyChart".  Sign up information is provided on this After Visit Summary.  MyChart is used to connect with patients for Virtual Visits (Telemedicine).  Patients are able to view lab/test results, encounter notes, upcoming appointments, etc.  Non-urgent messages can be sent to your provider as well.   To learn more about what you can do with MyChart, go to [email or web address].    Your next appointment:   12 month(s)  The format for your next appointment:   In Person  Provider:   You may see Chrystie Nose, MD or one of the following Advanced Practice Providers on your designated Care Team:    Azalee Course, PA-C  Micah Flesher, PA-C or   Judy Pimple, [place]    Other Instructions

## 2020-03-09 ENCOUNTER — Encounter: Payer: Self-pay | Admitting: Internal Medicine

## 2020-03-09 NOTE — Progress Notes (Signed)
OFFICE NOTE  Chief Complaint:  Routine follow-up  Primary Care Physician: Leanna Sato, MD  HPI:  [name] is a 74 y.o. male with a known history of coronary artery disease, hypertension, hyperlipidemia, and long-standing tobacco abuse. The patient underwent cardiac catheterization in 2004 with symptoms of stable angina, and was found to have chronic occlusion of the right coronary artery with left-to-right collaterals and insignificant disease in the left coronary system. He was treated medically. He has done well for years, then he began to develop classical symptoms of exertional angina. At the time, he was helping a friend frame a house and he began to experience some substernal chest pain radiating to the jaw that was usually promptly relieved by either rest or sublingual nitroglycerin. Over the last 3 weeks, symptoms continued to accelerate until the day prior to admission, when the patient developed a more severe episode of pain while walking a short distance. The pain again was relieved by sublingual nitroglycerin. He subsequently presented to the emergency department at [facility] where an EKG was performed demonstrating subtle ST segment depression in the anterolateral leads. Troponin levels remained negative. He was transferred to [facility] for further cardiac evaluation. The patient was admitted to [facility] on 10/18/2012. He underwent diagnostic catheterization on 10/19/2012 by Dr. Rennis Golden which showed left main and severe three vessel coronary artery disease with preserved left ventricular function. A cardiac surgery consult was requested and Dr. Cornelius Moras saw the patient. It was felt that he would benefit from surgical revascularization. All risks, benefits and alternatives of surgery were explained in detail, and the patient agreed to proceed.  The patient was taken to the operating room and underwent CABG x 3 (LIMA to LAD, SVG to OM and SVG to PDA. He was noted to be mildly volume  overloaded after discharge and was placed on Lasix and ultimately this was discontinued. He last followed up with Dr. Cornelius Moras and was felt to be stable and followup was requested as needed.    Today he is following up with [place] in the office for the first time and has some complaints about chest wall pain. In fact he is reportedly sore over the midsternal area especially when lifting objects. He pointed out a depression in his midsternum and wonders if this is an area where possibly the sternum did not fully heal. He did report feeling very sore around this area.  In addition he's been having problems with accelerated hypertension, in fact his blood pressure today in the office was 200/98 initially, but did come down to 160/90.  At times he feels like he wakes up at night and feels that his heart is beating harder than typical. He denies any significant chest pain that was similar to before his bypass surgery. Since increasing his amlodipine, his blood pressure is now much better controlled.  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 The results were reviewed by Dr. Cornelius Moras, his surgeon who felt that there was little else to do.  He is not having any significant pain with exercise or heavy lifting.  Unfortunately, there was a small pulmonary nodule and given his history of smoking and COPD, it was recommended that he have a followup CT scan in 6 months.  [name] returns today for followup. At his last office visit he was complaining of some worsening shortness of breath and had lower extremity swelling. I decreased his Norvasc to 5  mg and increased his Accuretic to 20/25 mg daily. He reports a marked improvement in his symptoms and his swelling has almost totally gone away. I checked laboratory work which shows normal renal function and a very low BNP of 22.  11/29/2017  [name] returns today for follow-up.  I not seen him since 2016.  He does have a history of coronary  disease and underwent cardiac catheterization by myself which showed multivessel coronary disease in 2014.  Subsequently underwent three-vessel CABG with LIMA to LAD, SVG to OM, SVG to PDA.  He is done very well from that standpoint and denies any chest pain or worsening associated symptoms.  He has however been short of breath.  He was diagnosed with some COPD and is on Spiriva which is managed by his primary care provider.  He was also found to have a CT scan that showed a lung nodule.  We follow that up to 2016 which showed stability however he is concerned that with his new symptoms of shortness of breath that there may be some change associated with that.  We discussed pulmonary function testing which she says he typically gets through the [place] [facility].  03/08/2020  [name] is seen today for routine follow-up.  He reports some interim improvement in his dyspnea.  He felt that his symptoms were probably related to some COPD.  He is now on Advair in addition to Spiriva.  He says this is helped significantly.  Recently though he had been noting some low blood pressures.  Took it upon himself to decrease his medications actually cutting all 3 of his blood pressure medications in half.  1 of those is a combination pill with quinapril and hydrochlorothiazide.  I advised him that it was not possible really to cut it exactly in half the way the tablet is formulated.  Blood pressure however today is very good at 124/72.  He reports after making those changes he had some improvement in presyncopal or positional dizziness symptoms.  PMHx:  Past Medical History:  Diagnosis Date  . Complication of anesthesia    patient thinks he has some breathing difficulties after anesthesia  . COPD, 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  . Coronary artery disease   . Dyslipidemia 10/18/2012  . History of kidney stones   . Hypertension   . 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  Cornelius Moras)  . Varicose veins     Past Surgical History:  Procedure Laterality Date  . CARDIAC CATHETERIZATION  03/26/2003   occluded RCA with collaterals from L to R, 60% mid Cfx disease, 40% branch disease to OM, LAD with 50% stenosis (Dr. Laurell Josephs)   . CATARACT EXTRACTION W/PHACO Right 08/19/2018   Procedure: CATARACT EXTRACTION PHACO AND INTRAOCULAR LENS PLACEMENT (IOC);  Surgeon: Fabio Pierce, MD;  Location: [facility] ORS;  Service: Ophthalmology;  Laterality: Right;  CDE: 9.14  . CATARACT EXTRACTION W/PHACO Left 09/27/2018   Procedure: CATARACT EXTRACTION PHACO AND INTRAOCULAR LENS PLACEMENT LEFT EYE  (CDE: 5.11);  Surgeon: Fabio Pierce, MD;  Location: [facility];  Service: Ophthalmology;  Laterality: Left;  . CHOLECYSTECTOMY    . CORONARY ARTERY BYPASS GRAFT N/A 10/20/2012   Procedure: CORONARY ARTERY BYPASS GRAFTING (CABG);  Surgeon: Purcell Nails, MD;  Location: [facility];  Service: Open Heart Surgery;  Laterality: N/A;  . INTRAOPERATIVE TRANSESOPHAGEAL ECHOCARDIOGRAM N/A 10/20/2012   Procedure: INTRAOPERATIVE TRANSESOPHAGEAL ECHOCARDIOGRAM;  Surgeon: Marilu Favre  Zadie CleverlyH [name], MD;  Location: [facility] OR;  Service: Open Heart Surgery;  Laterality: N/A;  . LEFT HEART CATHETERIZATION WITH CORONARY ANGIOGRAM N/A 10/19/2012   Procedure: LEFT HEART CATHETERIZATION WITH CORONARY ANGIOGRAM;  Surgeon: Levi NoseKenneth C. Lleyton Byers, MD;  [facility];  Service: Cardiovascular;  Laterality: N/A;  . SHOULDER SURGERY  2002    FAMHx:  Family History  Problem Relation Age of Onset  . Coronary artery disease Mother     SOCHx:   reports that he quit smoking about 7 years ago. His smoking use included cigarettes. He smoked 2.00 packs per day. He has never used smokeless tobacco. He reports that he does not drink alcohol and does not use drugs.  ALLERGIES:  No Known Allergies  ROS: Pertinent items noted in HPI and remainder of comprehensive ROS otherwise negative.  HOME MEDS: Current Outpatient Medications  Medication Sig  Dispense Refill  . amLODipine (NORVASC) 2.5 MG tablet Take 1 tablet (2.5 mg total) by mouth daily. 90 tablet 3  . aspirin EC 81 MG tablet Take 81 mg by mouth daily.    Marland Kitchen. atorvastatin (LIPITOR) 40 MG tablet Take 40 mg by mouth daily.    . Fluticasone-Salmeterol (ADVAIR) 250-50 MCG/DOSE AEPB Inhale 1 puff into the lungs 2 (two) times daily.    . metoprolol tartrate (LOPRESSOR) 25 MG tablet Take 1 tablet (25 mg total) by mouth 2 (two) times daily. 180 tablet 3  . tiotropium (SPIRIVA) 18 MCG inhalation capsule Place 18 mcg into inhaler and inhale daily.     . quinapril-hydrochlorothiazide (ACCURETIC) 10-12.5 MG tablet Take 1 tablet by mouth daily. 90 tablet 3   No current facility-administered medications for this visit.    LABS/IMAGING: No results found for this or any previous visit (from the past 48 hour(s)). No results found.  VITALS: BP 124/72   Pulse (!) 59   Ht 5\' 7"  (1.702 m)   Wt 208 lb 9.6 oz (94.6 kg)   SpO2 90%   BMI 32.67 kg/m   EXAM: General appearance: alert and no distress Neck: no carotid bruit and no JVD Lungs: clear to auscultation bilaterally Heart: regular rate and rhythm, S1, S2 normal, no murmur, click, rub or gallop Abdomen: soft, non-tender; bowel sounds normal; no masses,  no organomegaly Extremities: extremities normal, atraumatic, no cyanosis or edema Pulses: 2+ and symmetric Skin: Skin color, texture, turgor normal. No rashes or lesions Neurologic: Grossly normal  EKG: Sinus bradycardia 59, incomplete right bundle branch block with questionable lateral ST depression-personally reviewed  ASSESSMENT: 1. Coronary artery disease status post three-vessel CABG in 2014 2. 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 HTN - controlled 4. Dyslipidemia-at goal 5. Solitary pulmonary nodule 6. Bilateral lower extremity edema - resolved 7. COPD  PLAN: 1.   [name] reports improved dyspnea with adding an additional inhaler.  This is likely COPD.   He recently had some positional dizziness and low blood pressure and decreased all of his blood pressure medicines.  I will go ahead and formally cut the medications in half and give him a new prescription for his combination thiazide medication.  He did have some mild ST segment depression on his EKG laterally but reports being totally asymptomatic at this point.  I encouraged him to monitor that since he is a number of years out from his CABG.  I would encourage him to follow-up with me annually or sooner as necessary.  Levi NoseKenneth C. Chrisanne Loose, MD, [facility] LLCFACC, FACP  [place]  [facility] of the Advanced Lipid Disorders &  Cardiovascular Risk Reduction Clinic Diplomate of the American Board of Clinical Lipidology Attending Cardiologist  Direct Dial: [phone]  Fax: [phone]  Website:  [email or web address]  [name] 03/09/2020, 4:25 PM

## 2020-09-24 ENCOUNTER — Telehealth: Payer: Self-pay | Admitting: Internal Medicine

## 2020-09-24 NOTE — Telephone Encounter (Signed)
Spoke with the [name] and he reports that he has been having some worsening SOB with rest and exertion... he says he wakes up in the middle of the night and cannot catch his breath and his O2Sat is 78-82 but after he takes a few deep breaths his Sat goes up to 90.   He says his BP has been running 120/70 HR 80's.   He says he has some ankle edema but improves with elevation.   He has been started on inhalers but has not had much improvement. But, he has had some congestion for several weeks but no cough. He says it is in the back of his throat. No fever.   [name] is asking if he can have his yearly CT for nodules as he has had in the past. He says he had not had one due to COVID but would feel better if her can have it done soon.   I made him an appt with Dr. Antoine Poche for 10/08/20 but he is asking if he can have the CT prior.   I will forward to Dr. Antoine Poche for review and any further recommendations.

## 2020-09-24 NOTE — Telephone Encounter (Signed)
This looks like a Dr. Rennis Golden patient.

## 2020-09-24 NOTE — Telephone Encounter (Signed)
Pt c/o Shortness Of Breath: STAT if SOB developed within the last 24 hours or pt is noticeably SOB on the phone  1. Are you currently SOB (can you hear that pt is SOB on the phone)? No, biggest problem at night. Oxygen level ranging from 78-82 in the morning. Has been sleeping with oxygen due to this   2. How long have you been experiencing SOB? A month   3. Are you SOB when sitting or when up moving around? Moving around   4. Are you currently experiencing any other symptoms? CP every once in a while when breathing real hard. Thinks it is soreness in lungs not heart related CP. Has had angina before and states it does not feel the same when it occurs. Not having any CP now.    [name] is calling stating his SOB has worsened over the past month. Due to this he is wanting to arrange a CT being scheduled in [place]. He states this was a yearly thing Dr. Rennis Golden was having performed up until Covid so he has not done it in the last 2 years. [name] states his wife had Covid a few months back and he never got tested to know if he had it or not. He is unsure if this could be related and also mentioned he has COPD. Please advise.

## 2020-09-25 NOTE — Telephone Encounter (Signed)
Would recommend he see pulmonary (if he does not have one, would recommend Dr. Tonia Brooms at [facility]). This sounds like worsening emyphysemia/COPD - maybe COVID played a role if he had it. Best to start with them. We stopped with the CT scans because we were following a nodule that was stable and considered benign.  Dr Rexene Edison

## 2020-09-25 NOTE — Telephone Encounter (Signed)
Spoke with patient and confirmed he is a patient of Dr Rennis Golden He was getting CT scans regularly prior to COVID but stopped secondary  He would like to get CT and then follow up with Dr Rennis Golden Did cancel appointment with Dr Antoine Poche  Will forward to Dr Rennis Golden and Herb Grays RN for review

## 2020-09-26 ENCOUNTER — Ambulatory Visit: Payer: Medicare Other | Admitting: Internal Medicine

## 2020-09-26 NOTE — Telephone Encounter (Signed)
Patient aware of MD advice/recommendations. He has a pulmonologist with [facility] he can contact for an appointment

## 2020-10-10 ENCOUNTER — Ambulatory Visit: Payer: Medicare Other | Admitting: Cardiology

## 2020-11-19 ENCOUNTER — Other Ambulatory Visit: Payer: Self-pay | Admitting: Pulmonary Disease

## 2020-11-19 DIAGNOSIS — R911 Solitary pulmonary nodule: Secondary | ICD-10-CM

## 2020-12-11 ENCOUNTER — Ambulatory Visit
Admission: RE | Admit: 2020-12-11 | Discharge: 2020-12-11 | Disposition: A | Discharge status: Home / Self Care | Payer: Medicare Other | Source: Ambulatory Visit | Attending: Pulmonary Disease | Admitting: Pulmonary Disease

## 2020-12-11 ENCOUNTER — Other Ambulatory Visit: Payer: Self-pay

## 2020-12-11 DIAGNOSIS — I7143 Infrarenal abdominal aortic aneurysm, without rupture: Secondary | ICD-10-CM

## 2020-12-11 DIAGNOSIS — R911 Solitary pulmonary nodule: Secondary | ICD-10-CM

## 2020-12-11 DIAGNOSIS — I714 Abdominal aortic aneurysm, without rupture: Secondary | ICD-10-CM

## 2020-12-11 HISTORY — DX: Infrarenal abdominal aortic aneurysm, without rupture: I71.43

## 2020-12-11 HISTORY — DX: Abdominal aortic aneurysm, without rupture: I71.4

## 2020-12-19 ENCOUNTER — Ambulatory Visit (INDEPENDENT_AMBULATORY_CARE_PROVIDER_SITE_OTHER): Payer: Medicare Other | Admitting: Vascular Surgery

## 2020-12-19 ENCOUNTER — Other Ambulatory Visit: Payer: Self-pay

## 2020-12-19 VITALS — BP 137/72 | HR 58 | Ht 67.0 in | Wt 212.0 lb

## 2020-12-19 DIAGNOSIS — I2583 Coronary atherosclerosis due to lipid rich plaque: Secondary | ICD-10-CM

## 2020-12-19 DIAGNOSIS — I10 Essential (primary) hypertension: Secondary | ICD-10-CM | POA: Diagnosis not present

## 2020-12-19 DIAGNOSIS — I714 Abdominal aortic aneurysm, without rupture, unspecified: Secondary | ICD-10-CM

## 2020-12-19 DIAGNOSIS — J449 Chronic obstructive pulmonary disease, unspecified: Secondary | ICD-10-CM

## 2020-12-19 DIAGNOSIS — I2510 Atherosclerotic heart disease of native coronary artery without angina pectoris: Secondary | ICD-10-CM

## 2020-12-19 DIAGNOSIS — I713 Abdominal aortic aneurysm, ruptured, unspecified: Secondary | ICD-10-CM

## 2020-12-19 DIAGNOSIS — N200 Calculus of kidney: Secondary | ICD-10-CM | POA: Insufficient documentation

## 2020-12-19 DIAGNOSIS — E785 Hyperlipidemia, unspecified: Secondary | ICD-10-CM | POA: Insufficient documentation

## 2020-12-22 ENCOUNTER — Encounter (INDEPENDENT_AMBULATORY_CARE_PROVIDER_SITE_OTHER): Payer: Self-pay | Admitting: Vascular Surgery

## 2020-12-22 DIAGNOSIS — I714 Abdominal aortic aneurysm, without rupture, unspecified: Secondary | ICD-10-CM | POA: Insufficient documentation

## 2020-12-22 NOTE — Progress Notes (Signed)
MRN : [id number]  [name] is a 75 y.o. (1946/02/12) male who presents with chief complaint of  Chief Complaint  Patient presents with   New Patient (Initial Visit)    NP retroperitoneal structure seen on imaging . Chest CT done 6.1.22  .  History of Present Illness:   The patient presents to the office for evaluation of an abdominal aortic aneurysm. The aneurysm was found incidentally by CT scan of the chest and is incompletely characterized. Patient denies abdominal pain or unusual back pain, no other abdominal complaints.  No history of an acute onset of painful blue discoloration of the toes.     No family history of AAA.   Patient denies amaurosis fugax or TIA symptoms. There is no history of claudication or rest pain symptoms of the lower extremities.  The patient denies angina or shortness of breath.  I have personally reviewed the CT with the patient and it shows CT scan shows an AAA that measures >5.00 cm   Current Meds  Medication Sig   amLODipine (NORVASC) 2.5 MG tablet Take 1 tablet (2.5 mg total) by mouth daily.   aspirin EC 81 MG tablet Take 81 mg by mouth daily.   atorvastatin (LIPITOR) 40 MG tablet Take 40 mg by mouth daily.   Fluticasone-Umeclidin-Vilant (TRELEGY ELLIPTA) 100-62.5-25 MCG/INH AEPB Inhale into the lungs.   ibuprofen (ADVIL) 200 MG tablet Take 200 mg by mouth every 6 (six) hours as needed.   metoprolol tartrate (LOPRESSOR) 25 MG tablet Take 1 tablet (25 mg total) by mouth 2 (two) times daily.   quinapril-hydrochlorothiazide (ACCURETIC) 10-12.5 MG tablet Take 1 tablet by mouth daily.    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  Cornelius Moras)   Varicose veins     Past Surgical History:  Procedure Laterality Date   CARDIAC CATHETERIZATION  03/26/2003   occluded RCA with collaterals from L to R, 60% mid Cfx disease, 40% branch disease to OM, LAD with 50% stenosis (Dr. Laurell Josephs)    CATARACT EXTRACTION W/PHACO Right 08/19/2018   Procedure: CATARACT EXTRACTION PHACO AND INTRAOCULAR LENS PLACEMENT (IOC);  Surgeon: Fabio Pierce, MD;  Location: [facility];  Service: Ophthalmology;  Laterality: Right;  CDE: 9.14   CATARACT EXTRACTION W/PHACO Left 09/27/2018   Procedure: CATARACT EXTRACTION PHACO AND INTRAOCULAR LENS PLACEMENT LEFT EYE  (CDE: 5.11);  Surgeon: Fabio Pierce, MD;  Location: [facility];  Service: Ophthalmology;  Laterality: Left;   CHOLECYSTECTOMY     CORONARY ARTERY BYPASS GRAFT N/A 10/20/2012   Procedure: CORONARY ARTERY BYPASS GRAFTING (CABG);  Surgeon: Purcell Nails, MD;  Location: [facility] OR;  Service: Open Heart Surgery;  Laterality: N/A;   INTRAOPERATIVE TRANSESOPHAGEAL ECHOCARDIOGRAM N/A 10/20/2012   Procedure: INTRAOPERATIVE TRANSESOPHAGEAL ECHOCARDIOGRAM;  Surgeon: Purcell Nails, MD;  Location: [facility];  Service: Open Heart Surgery;  Laterality: N/A;   LEFT HEART CATHETERIZATION WITH CORONARY ANGIOGRAM N/A 10/19/2012   Procedure: LEFT HEART CATHETERIZATION WITH CORONARY ANGIOGRAM;  Surgeon: Chrystie Nose, MD;  Location: [facility] CATH LAB;  Service: Cardiovascular;  Laterality: N/A;   SHOULDER SURGERY  2002  Social History Social History   Tobacco Use   Smoking status: Former    Packs/day: 2.00    Pack years: 0.00    Types: Cigarettes    Quit date: 10/11/2012    Years since quitting: 8.2   Smokeless tobacco: Never  Substance Use Topics   Alcohol use: No   Drug use: No    Family History Family History  Problem Relation Age of Onset   Coronary artery disease Mother   No family history of bleeding/clotting disorders, porphyria or autoimmune disease   No Known Allergies   REVIEW OF SYSTEMS (Negative unless  checked)  Constitutional: [] Weight loss  [] Fever  [] Chills Cardiac: [] Chest pain   [] Chest pressure   [] Palpitations   [] Shortness of breath when laying flat   [] Shortness of breath with exertion. Vascular:  [] Pain in legs with walking   [] Pain in legs at rest  [] History of DVT   [] Phlebitis   [] Swelling in legs   [] Varicose veins   [] Non-healing ulcers Pulmonary:   [] Uses home oxygen   [] Productive cough   [] Hemoptysis   [] Wheeze  [x] COPD   [] Asthma Neurologic:  [] Dizziness   [] Seizures   [] History of stroke   [] History of TIA  [] Aphasia   [] Vissual changes   [] Weakness or numbness in arm   [] Weakness or numbness in leg Musculoskeletal:   [] Joint swelling   [] Joint pain   [] Low back pain Hematologic:  [] Easy bruising  [] Easy bleeding   [] Hypercoagulable state   [] Anemic Gastrointestinal:  [] Diarrhea   [] Vomiting  [] Gastroesophageal reflux/heartburn   [] Difficulty swallowing. Genitourinary:  [] Chronic kidney disease   [] Difficult urination  [] Frequent urination   [] Blood in urine Skin:  [] Rashes   [] Ulcers  Psychological:  [] History of anxiety   []  History of major depression.  Physical Examination  Vitals:   12/19/20 1013  BP: 137/72  Pulse: (!) 58  Weight: 212 lb (96.2 kg)  Height: 5\' 7"  (1.702 m)   Body mass index is 33.2 kg/m. Gen: WD/WN, NAD Head: [place]/AT, No temporalis wasting.  Ear/Nose/Throat: Hearing grossly intact, nares w/o erythema or drainage, poor dentition Eyes: PER, EOMI, sclera nonicteric.  Neck: Supple, no masses.  No bruit or JVD.  Pulmonary:  Good air movement, clear to auscultation bilaterally, no use of accessory muscles.  Cardiac: RRR, normal S1, S2, no Murmurs. Vascular:  palpable abdominal mass; popliteal pulses are normal.  No carotid bruits Vessel Right Left  Radial Palpable Palpable  Carotid Palpable Palpable  Femoral Palpable Palpable  Popliteal Palpable Palpable  PT Palpable Palpable  DP Palpable Palpable  Gastrointestinal: soft, non-distended. No  guarding/no peritoneal signs.  Musculoskeletal: M/S 5/5 throughout.  No deformity or atrophy.  Neurologic: CN 2-12 intact. Pain and light touch intact in extremities.  Symmetrical.  Speech is fluent. Motor exam as listed above. Psychiatric: Judgment intact, Mood & affect appropriate for pt's clinical situation. Dermatologic: No rashes or ulcers noted.  No changes consistent with cellulitis.  CBC Lab Results  Component Value Date   WBC 9.9 08/15/2018   HGB 14.8 08/15/2018   HCT 45.3 08/15/2018   MCV 90.2 08/15/2018   PLT 223 08/15/2018    BMET    Component Value Date/Time   NA 132 (L) 08/15/2018 1302   K 3.5 08/15/2018 1302   CL 97 (L) 08/15/2018 1302   CO2 28 08/15/2018 1302   GLUCOSE 91 08/15/2018 1302   BUN 17 08/15/2018 1302   CREATININE 0.92 08/15/2018 1302   CREATININE 1.12 07/27/2014 1150  CALCIUM 8.6 (L) 08/15/2018 1302   GFRNONAA >60 08/15/2018 1302   GFRAA >60 08/15/2018 1302   CrCl cannot be calculated (Patient's most recent lab result is older than the maximum 21 days allowed.).  COAG Lab Results  Component Value Date   INR 1.30 10/20/2012   INR 1.03 10/19/2012    Radiology CT CHEST WO CONTRAST  Result Date: 12/11/2020 CLINICAL DATA:  Lung nodule seen on imaging study. Follow-up right middle lobe pulmonary nodule. EXAM: CT CHEST WITHOUT CONTRAST TECHNIQUE: Multidetector CT imaging of the chest was performed following the standard protocol without IV contrast. COMPARISON:  Most recent chest imaging CT 12/14/2017. Most remote chest CT 07/27/2013 FINDINGS: Cardiovascular: Aortic atherosclerosis at least moderate in degree. Post CABG with calcification of native coronary arteries. Mild dilatation of the main pulmonary artery at 3.7 cm. Heart is normal in size. There is no pericardial effusion. Mediastinum/Nodes: No enlarged mediastinal lymph nodes. Limited assessment for hilar adenopathy on this noncontrast exam. No axillary adenopathy. No visualized thyroid nodule.  No esophageal wall thickening. Lungs/Pleura: Moderate emphysema. Punctate subpleural nodule in the left upper lobe, series 4, image 56, new from prior exam. 6 mm nodule in the right middle lobe, series 4, image 92, stable from priors. Stability of 3 years is consistent with benign process. Calcified granuloma in the right lower lobe, benign. No consolidation or acute airspace disease. No findings of pulmonary edema. There is no pleural fluid. Upper Abdomen: Fatty atrophy of the pancreas. Nonobstructing 6 mm intrarenal stone in the right kidney. Peripherally calcified structure in the left retroperitoneum measuring 5.6 x 4.7 cm abuts the aorta and may represent a saccular aneurysm, but is only partially included in the field of view. No adjacent stranding. Cholecystectomy. Subxiphoid hernia of intra-abdominal fat. Colonic diverticulosis of the left colon. Musculoskeletal: Post median sternotomy. There are no acute or suspicious osseous abnormalities. IMPRESSION: 1. Stable 6 mm right middle lobe pulmonary nodule from 2015, considered benign. No dedicated imaging follow-up of this nodule is needed. 2. However, there is a new punctate subpleural nodule in the anterior left upper lobe. 3. Moderate emphysema. Consider yearly lung cancer screening CT if patient is eligible. Alternatively, 1 year follow-up of this new nodule is recommended. 4. Aortic atherosclerosis and coronary artery calcifications, post CABG. Mild dilatation of the main pulmonary artery can be seen with pulmonary arterial hypertension. 5. Partially included in the upper abdomen is a peripherally calcified left retroperitoneal structure measuring at least 5.6 x 4.7 cm, not entirely included in the field of view, abutting the abdominal aorta. Differential considerations include saccular aneurysm or adenopathy. This was not previously included in the field of view. In the absence of prior abdominal imaging for characterization, recommend abdominal CTA or  MRA for further assessment. 6. Incidental right renal calculus. Aortic Atherosclerosis (ICD10-I70.0) and Emphysema (ICD10-J43.9). These results will be called to the ordering clinician or representative by the Radiologist Assistant, and communication documented in the PACS or [employer]. Electronically Signed   By: Narda Rutherford M.D.   On: 12/11/2020 22:06     Assessment/Plan 1. AAA (abdominal aortic aneurysm) without rupture (HCC) Recommend: The patient has an abdominal aortic aneurysm that is >5.0 cm by CT scan and based on this study it appears that it is likely suitable for endovascular treatment, by the noncontrasted CT is has adequate infrarenal neck but it does appear to be sharply angulated.  The patient is otherwise in reasonable health.   Therefore, the patient should undergo endovascular repair of the  AAA to prevent future leathal rupture.   Patient will require CT angiography of the abdomen and pelvis in order to appropriately plan repair of the AAA.  The risks and benefits as well as the alternative therapies was discussed in detail with the patient. All questions were answered. The patient agrees to move forward the AAA repair and therefore with CT scan.  The patient will follow up with me in the office after the CT scan to review the study.   - CT Angio Abd/Pel w/ and/or w/o; Future  2. Coronary artery disease due to lipid rich plaque Continue cardiac and antihypertensive medications as already ordered and reviewed, no changes at this time.  Continue statin as ordered and reviewed, no changes at this time  Nitrates PRN for chest pain   3. Primary hypertension Continue antihypertensive medications as already ordered, these medications have been reviewed and there are no changes at this time.   4. COPD, severe (HCC) Continue pulmonary medications and aerosols as already ordered, these medications have been reviewed and there are no changes at this time.    5.  Abdominal aortic aneurysm, ruptured [facility](HCC) Not ruptured See #1   Levora DredgeGregory Nadir Vasques, MD  12/22/2020 10:25 AM

## 2021-01-03 ENCOUNTER — Ambulatory Visit
Admission: RE | Admit: 2021-01-03 | Discharge: 2021-01-03 | Disposition: A | Discharge status: Home / Self Care | Payer: Medicare Other | Source: Ambulatory Visit | Attending: Vascular Surgery | Admitting: Vascular Surgery

## 2021-01-03 ENCOUNTER — Other Ambulatory Visit: Payer: Self-pay

## 2021-01-03 DIAGNOSIS — I713 Abdominal aortic aneurysm, ruptured, unspecified: Secondary | ICD-10-CM

## 2021-01-03 LAB — POCT I-STAT CREATININE: Creatinine, Ser: 0.9 mg/dL (ref 0.61–1.24)

## 2021-01-03 MED ORDER — IOHEXOL 350 MG/ML SOLN
100.0000 mL | Freq: Once | INTRAVENOUS | Status: AC | PRN
  Administered 2021-01-03: 100 mL via INTRAVENOUS

## 2021-01-16 ENCOUNTER — Ambulatory Visit (INDEPENDENT_AMBULATORY_CARE_PROVIDER_SITE_OTHER): Payer: Medicare Other | Admitting: Vascular Surgery

## 2021-01-16 ENCOUNTER — Other Ambulatory Visit: Payer: Self-pay

## 2021-01-16 VITALS — BP 139/72 | HR 65 | Resp 16 | Ht 67.0 in | Wt 212.0 lb

## 2021-01-16 DIAGNOSIS — I2510 Atherosclerotic heart disease of native coronary artery without angina pectoris: Secondary | ICD-10-CM

## 2021-01-16 DIAGNOSIS — I714 Abdominal aortic aneurysm, without rupture, unspecified: Secondary | ICD-10-CM

## 2021-01-16 DIAGNOSIS — E782 Mixed hyperlipidemia: Secondary | ICD-10-CM

## 2021-01-16 DIAGNOSIS — I519 Heart disease, unspecified: Secondary | ICD-10-CM | POA: Insufficient documentation

## 2021-01-16 DIAGNOSIS — I10 Essential (primary) hypertension: Secondary | ICD-10-CM | POA: Diagnosis not present

## 2021-01-16 DIAGNOSIS — I2583 Coronary atherosclerosis due to lipid rich plaque: Secondary | ICD-10-CM

## 2021-01-16 DIAGNOSIS — J449 Chronic obstructive pulmonary disease, unspecified: Secondary | ICD-10-CM

## 2021-01-19 ENCOUNTER — Encounter (INDEPENDENT_AMBULATORY_CARE_PROVIDER_SITE_OTHER): Payer: Self-pay | Admitting: Vascular Surgery

## 2021-01-19 NOTE — Progress Notes (Signed)
MRN : [id number]  [name] is a 75 y.o. ([birth year]) male who presents with chief complaint of  Chief Complaint  Patient presents with   Follow-up    CT results  .  History of Present Illness:   The patient is seen for follow up evaluation of AAA status post CTA. There were no problems or complications related to the CT scan. The patient denies interval development of abdominal or back pain. No new lower extremity pain or discoloration of the toes.   The patient has a history of coronary artery disease, no recent episodes of angina or shortness of breath. The patient denies interval anaurosis fugax. There is o recent history of TIA symptoms or focal motor deficits. The patient denies PAD or claudication symptoms. There is a history of hyperlipidemia which is being treated with a statin.   CT angiography of the abdomen and pelvis shows an infrarenal AAA 7.4 cm.   Current Meds  Medication Sig   amLODipine (NORVASC) 2.5 MG tablet Take 1 tablet (2.5 mg total) by mouth daily.   aspirin EC 81 MG tablet Take 81 mg by mouth daily.   atorvastatin (LIPITOR) 40 MG tablet Take 40 mg by mouth daily.   Fluticasone-Umeclidin-Vilant (TRELEGY ELLIPTA) 100-62.5-25 MCG/INH AEPB Inhale into the lungs.   ibuprofen (ADVIL) 200 MG tablet Take 200 mg by mouth every 6 (six) hours as needed.   metoprolol tartrate (LOPRESSOR) 25 MG tablet Take 1 tablet (25 mg total) by mouth 2 (two) times daily.   quinapril-hydrochlorothiazide (ACCURETIC) 10-12.5 MG tablet Take 1 tablet by mouth daily.    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 Cornelius Moraswen)   Varicose veins     Past Surgical History:   Procedure Laterality Date   CARDIAC CATHETERIZATION  03/26/2003   occluded RCA with collaterals from L to R, 60% mid Cfx disease, 40% branch disease to OM, LAD with 50% stenosis (Dr. Laurell Josephs. McQueen)    CATARACT EXTRACTION W/PHACO Right 08/19/2018   Procedure: CATARACT EXTRACTION PHACO AND INTRAOCULAR LENS PLACEMENT (IOC);  Surgeon: Fabio PierceWrzosek, James, MD;  Location: [facility];  Service: Ophthalmology;  Laterality: Right;  CDE: 9.14   CATARACT EXTRACTION W/PHACO Left 09/27/2018   Procedure: CATARACT EXTRACTION PHACO AND INTRAOCULAR LENS PLACEMENT LEFT EYE  (CDE: 5.11);  Surgeon: Fabio PierceWrzosek, James, MD;  Location: [facility];  Service: Ophthalmology;  Laterality: Left;   CHOLECYSTECTOMY     CORONARY ARTERY BYPASS GRAFT N/A 10/20/2012   Procedure: CORONARY ARTERY BYPASS GRAFTING (CABG);  Surgeon: Purcell Nailslarence H [name], MD;  [facility];  Service: Open Heart Surgery;  Laterality: N/A;   INTRAOPERATIVE TRANSESOPHAGEAL ECHOCARDIOGRAM N/A 10/20/2012   Procedure: INTRAOPERATIVE TRANSESOPHAGEAL ECHOCARDIOGRAM;  Surgeon: Purcell Nailslarence H [name], MD;  [facility];  Service: Open Heart Surgery;  Laterality: N/A;   LEFT HEART CATHETERIZATION WITH CORONARY ANGIOGRAM N/A 10/19/2012   Procedure: LEFT HEART CATHETERIZATION WITH CORONARY ANGIOGRAM;  Surgeon: Chrystie NoseKenneth C. Hilty, MD;  [facility];  Service: Cardiovascular;  Laterality: N/A;   SHOULDER SURGERY  2002    Social History Social History   Tobacco Use  Smoking status: Former    Packs/day: 2.00    Pack years: 0.00    Types: Cigarettes    Quit date: 10/11/2012    Years since quitting: 8.2   Smokeless tobacco: Never  Substance Use Topics   Alcohol use: No   Drug use: No    Family History Family History  Problem Relation Age of Onset   Coronary artery disease Mother     No Known Allergies   REVIEW OF SYSTEMS (Negative unless checked)  Constitutional: [] Weight loss  [] Fever  [] Chills Cardiac: [] Chest pain   [] Chest pressure   [] Palpitations   [] Shortness of  breath when laying flat   [] Shortness of breath with exertion. Vascular:  [] Pain in legs with walking   [] Pain in legs at rest  [] History of DVT   [] Phlebitis   [] Swelling in legs   [] Varicose veins   [] Non-healing ulcers Pulmonary:   [] Uses home oxygen   [] Productive cough   [] Hemoptysis   [] Wheeze  [] COPD   [] Asthma Neurologic:  [] Dizziness   [] Seizures   [] History of stroke   [] History of TIA  [] Aphasia   [] Vissual changes   [] Weakness or numbness in arm   [] Weakness or numbness in leg Musculoskeletal:   [] Joint swelling   [] Joint pain   [] Low back pain Hematologic:  [] Easy bruising  [] Easy bleeding   [] Hypercoagulable state   [] Anemic Gastrointestinal:  [] Diarrhea   [] Vomiting  [] Gastroesophageal reflux/heartburn   [] Difficulty swallowing. Genitourinary:  [] Chronic kidney disease   [] Difficult urination  [] Frequent urination   [] Blood in urine Skin:  [] Rashes   [] Ulcers  Psychological:  [] History of anxiety   []  History of major depression.  Physical Examination  Vitals:   01/16/21 1544  BP: 139/72  Pulse: 65  Resp: 16  Weight: 212 lb (96.2 kg)  Height: 5\' 7"  (1.702 m)   Body mass index is 33.2 kg/m. Gen: WD/WN, NAD Head: [place]/AT, No temporalis wasting.  Ear/Nose/Throat: Hearing grossly intact, nares w/o erythema or drainage Eyes: PER, EOMI, sclera nonicteric.  Neck: Supple, no large masses.   Pulmonary:  Good air movement, no audible wheezing bilaterally, no use of accessory muscles.  Cardiac: RRR, no JVD Vascular:  Vessel Right Left  Radial Palpable Palpable  PT Palpable Palpable  DP Palpable Palpable  Gastrointestinal: Non-distended. No guarding/no peritoneal signs.  Musculoskeletal: M/S 5/5 throughout.  No deformity or atrophy.  Neurologic: CN 2-12 intact. Symmetrical.  Speech is fluent. Motor exam as listed above. Psychiatric: Judgment intact, Mood & affect appropriate for pt's clinical situation. Dermatologic: No rashes or ulcers noted.  No changes consistent with  cellulitis. Lymph : No lichenification or skin changes of chronic lymphedema.  CBC Lab Results  Component Value Date   WBC 9.9 08/15/2018   HGB 14.8 08/15/2018   HCT 45.3 08/15/2018   MCV 90.2 08/15/2018   PLT 223 08/15/2018    BMET    Component Value Date/Time   NA 132 (L) 08/15/2018 1302   K 3.5 08/15/2018 1302   CL 97 (L) 08/15/2018 1302   CO2 28 08/15/2018 1302   GLUCOSE 91 08/15/2018 1302   BUN 17 08/15/2018 1302   CREATININE 0.90 01/03/2021 1524   CREATININE 1.12 07/27/2014 1150   CALCIUM 8.6 (L) 08/15/2018 1302   GFRNONAA >60 08/15/2018 1302   GFRAA >60 08/15/2018 1302   Estimated Creatinine Clearance: 77.1 mL/min (by C-G formula based on SCr of 0.9 mg/dL).  COAG Lab Results  Component Value Date   INR 1.30 10/20/2012  INR 1.03 10/19/2012    Radiology CT Angio Abd/Pel w/ and/or w/o  Result Date: 01/04/2021 CLINICAL DATA:  75 year old male with abdominal aortic aneurysm EXAM: CTA ABDOMEN AND PELVIS WITHOUT AND WITH CONTRAST TECHNIQUE: Multidetector CT imaging of the abdomen and pelvis was performed using the standard protocol during bolus administration of intravenous contrast. Multiplanar reconstructed images and MIPs were obtained and reviewed to evaluate the vascular anatomy. CONTRAST:  OMNIPAQUE IOHEXOL 350 MG/ML SOLN COMPARISON:  None. FINDINGS: VASCULAR Aorta: Mild atherosclerotic changes of the distal thoracic aorta. Diameter of the aorta at the hiatus 28 mm. Juxtarenal aorta estimated 30 mm. Saccular aneurysm of the left infrarenal abdominal aorta, with the greatest estimated diameter from wall to wall 7.7 cm. Given the location of mural calcifications of the presumed native aortic wall, this is favored to represent saccular aneurysm related to ulcerated plaque. The most superior aspect of the aneurysm sac of list left renal artery, which appears narrowed at the origin No inflammatory changes or periaortic fluid at the aneurysm sac. The distal aorta just  above the bifurcation measures 3.8 cm, below the IMA origin. No inflammatory changes or periaortic fluid at this location. Celiac: Celiac artery patent with mild atherosclerosis. No significant stenosis. Branch vessels are patent. SMA: SMA patent with mild atherosclerosis. Branch vessels are patent. Renals: - Right: Single right renal artery. No high-grade stenosis at the right renal artery origin. - Left: The main left renal artery is narrowed at the origin secondary to combination the atherosclerotic changes at the origin and mass effect of the saccular aneurysm. This narrowing appears to be high-grade on the CT. There is a accessory left renal artery to the lower pole segment, below the IMA origin. IMA: IMA is patent. Right lower extremity: Moderate tortuosity of the right iliac arterial system. Mild to moderate atherosclerotic changes without high-grade stenosis or occlusion. Hypogastric artery is patent with calcified plaque at the origin. Common femoral artery is patent with mild atherosclerosis. High bifurcation of the right renal artery. Proximal profunda femoris and SFA patent. Left lower extremity: Moderate tortuosity of the left iliac arterial system. Mild to moderate atherosclerotic changes without high-grade stenosis or occlusion. Hypogastric artery is patent and narrowed at the origin secondary to atherosclerotic plaque. Common femoral artery is patent with mild atherosclerotic changes. High bifurcation of the left common femoral artery. Proximal profunda femoris and SFA patent. Veins: Unremarkable appearance of the venous system. Review of the MIP images confirms the above findings. NON-VASCULAR Lower chest: Calcified right hilar lymph nodes. No acute finding of the lung bases. Hepatobiliary: Unremarkable appearance of the liver. Cholecystectomy Pancreas: Unremarkable Spleen: Unremarkable. Adrenals/Urinary Tract: - Right adrenal gland: Unremarkable - Left adrenal gland: Unremarkable. - Right kidney:  No hydronephrosis. Nonobstructing stone in the hilum of the right kidney measuring 7 mm. No perinephric fluid or inflammatory changes. Unremarkable course of the right ureter. No focal lesion. - Left Kidney: No hydronephrosis. No nephrolithiasis. Unremarkable course of the left ureter. Nonenhancing 19 mm lesion at the superior cortex of the left kidney likely a complex cyst. - Urinary Bladder: Unremarkable urinary bladder Stomach/Bowel: - Stomach: Unremarkable. - Small bowel: Unremarkable - Appendix: Normal. - Colon: No obstruction. 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 Lymphatic: No lymphadenopathy. Mesenteric: No free fluid or air. No mesenteric adenopathy. Reproductive: Unremarkable appearance of the prostate Other: No hernia. Musculoskeletal: Degenerative changes of the spine. No acute displaced fracture. Vacuum disc phenomenon of L4-L5 and L5-S1. No significant bony canal narrowing. Degenerative changes of the hips. 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 Aortic aneurysm NOS (ICD10-I71.9). Aortic Atherosclerosis (ICD10-I70.0). 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 On the left the narrowing is likely related to a combination of both atherosclerotic plaque as well as mass effect from the superior aspect of the aneurysm sac. Of note there is a small accessory left renal artery arising below the IMA to the lower pole cortex. Bilateral mild to moderate iliac arterial disease without high-grade stenosis or occlusion. Mild mesenteric arterial disease. Additional ancillary findings as above. Signed, Yvone Neu. Reyne Dumas, RPVI [facility] Electronically Signed   By: Gilmer Mor D.O.   On: 01/04/2021 13:28     Assessment/Plan 1. AAA (abdominal aortic aneurysm) without rupture  (HCC) Recommend: The aneurysm is > 5 cm and therefore should undergo repair. Patient is status post CT scan of the abdominal aorta. The patient is a candidate for endovascular repair.   The patient will continue antiplatelet therapy as prescribed (since the patient is undergoing endovascular repair as opposed to open repair) as well as aggressive management of hyperlipidemia. Exercise is again strongly encouraged.   The patient is reminded that lifetime routine surveillance is a necessity with an endograft.   The risks and benefits of AAA repair are reviewed with the patient.  All questions are answered.  Alternative therapies are also discussed.  The patient agrees to proceed with endovascular aneurysm repair.  Patient will follow-up with me in the office after the surgery.   2. Coronary artery disease due to lipid rich plaque Continue cardiac and antihypertensive medications as already ordered and reviewed, no changes at this time.  Continue statin as ordered and reviewed, no changes at this time  Nitrates PRN for chest pain   3. Primary hypertension Continue antihypertensive medications as already ordered, these medications have been reviewed and there are no changes at this time.   4. COPD, severe (HCC) Continue pulmonary medications and aerosols as already ordered, these medications have been reviewed and there are no changes at this time.    5. Mixed hyperlipidemia Continue statin as ordered and reviewed, no changes at this time     [name], MD  01/19/2021 10:33 AM

## 2021-02-03 ENCOUNTER — Telehealth (HOSPITAL_BASED_OUTPATIENT_CLINIC_OR_DEPARTMENT_OTHER): Payer: Self-pay | Admitting: Family

## 2021-02-03 NOTE — Telephone Encounter (Signed)
Spoke with patient regarding the Tuesday 02/11/21 3:00pm appointment with Hubbard Hartshorn, NP-----patient is aggreeable tor a 1:15 pm appointment time  the same day.

## 2021-02-11 ENCOUNTER — Ambulatory Visit (HOSPITAL_BASED_OUTPATIENT_CLINIC_OR_DEPARTMENT_OTHER): Payer: Medicare Other | Admitting: Family

## 2021-02-11 ENCOUNTER — Ambulatory Visit (INDEPENDENT_AMBULATORY_CARE_PROVIDER_SITE_OTHER): Payer: Medicare Other | Admitting: Family

## 2021-02-11 ENCOUNTER — Other Ambulatory Visit: Payer: Self-pay

## 2021-02-11 ENCOUNTER — Encounter (HOSPITAL_BASED_OUTPATIENT_CLINIC_OR_DEPARTMENT_OTHER): Payer: Self-pay | Admitting: Family

## 2021-02-11 VITALS — BP 116/76 | HR 52 | Ht 67.0 in | Wt 212.0 lb

## 2021-02-11 DIAGNOSIS — J449 Chronic obstructive pulmonary disease, unspecified: Secondary | ICD-10-CM

## 2021-02-11 DIAGNOSIS — I10 Essential (primary) hypertension: Secondary | ICD-10-CM | POA: Diagnosis not present

## 2021-02-11 DIAGNOSIS — Z01810 Encounter for preprocedural cardiovascular examination: Secondary | ICD-10-CM | POA: Diagnosis not present

## 2021-02-11 DIAGNOSIS — I25118 Atherosclerotic heart disease of native coronary artery with other forms of angina pectoris: Secondary | ICD-10-CM | POA: Diagnosis not present

## 2021-02-11 DIAGNOSIS — E785 Hyperlipidemia, unspecified: Secondary | ICD-10-CM

## 2021-02-11 DIAGNOSIS — I714 Abdominal aortic aneurysm, without rupture, unspecified: Secondary | ICD-10-CM

## 2021-02-11 NOTE — Patient Instructions (Addendum)
Medication Instructions:  Continue your current medications.   *If you need a refill on your cardiac medications before your next appointment, please call your pharmacy*  Lab Work: None ordered today.   Testing/Procedures: Your EKG today was stable compared to previous.   Follow-Up: At [facility], you and your health needs are our priority.  As part of our continuing mission to provide you with exceptional heart care, we have created designated Provider Care Teams.  These Care Teams include your primary Cardiologist (physician) and Advanced Practice Providers (APPs -  Physician Assistants and Nurse Practitioners) who all work together to provide you with the care you need, when you need it.  We recommend signing up for the patient portal called "MyChart".  Sign up information is provided on this After Visit Summary.  MyChart is used to connect with patients for Virtual Visits (Telemedicine).  Patients are able to view lab/test results, encounter notes, upcoming appointments, etc.  Non-urgent messages can be sent to your provider as well.   To learn more about what you can do with MyChart, go to [email or web address].    Your next appointment:   6 month(s)  The format for your next appointment:   In Person  Provider:   You may see Chrystie Nose, MD or Alver Sorrow, NP in [facility]   Other Instructions  Alver Sorrow, NP will send a note to Dr. Lorretta Harp that you are appropriate for surgery.   Heart Healthy Diet Recommendations: A low-salt diet is recommended. Meats should be grilled, baked, or boiled. Avoid fried foods. Focus on lean protein sources like fish or chicken with vegetables and fruits. The American Heart Association is a Chief Technology Officer!    Exercise recommendations: The American Heart Association recommends 150 minutes of moderate intensity exercise weekly. Try 30 minutes of moderate intensity exercise 4-5 times per week. This could include walking,  jogging, or swimming.

## 2021-02-11 NOTE — Progress Notes (Signed)
Office Visit    Patient Name: [name] Date of Encounter: 02/11/2021  PCP:  Leanna Sato, MD   [facility]  Cardiologist:  Chrystie Nose, MD  Advanced Practice Provider:  No care team member to display Electrophysiologist:  None   Chief Complaint    [name] is a 75 y.o. 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 Cornelius Moras)   Varicose veins    Past Surgical History:  Procedure Laterality Date   CARDIAC CATHETERIZATION  03/26/2003   occluded RCA with collaterals from L to R, 60% mid Cfx disease, 40% branch disease to OM, LAD with 50% stenosis (Dr. Laurell Josephs)    CATARACT EXTRACTION W/PHACO Right 08/19/2018   Procedure: CATARACT EXTRACTION PHACO AND INTRAOCULAR LENS PLACEMENT (IOC);  Surgeon: Fabio Pierce, MD;  Location: [facility];  Service: Ophthalmology;  Laterality: Right;  CDE: 9.14   CATARACT EXTRACTION W/PHACO Left 09/27/2018   Procedure: CATARACT EXTRACTION PHACO AND INTRAOCULAR LENS PLACEMENT LEFT EYE  (CDE: 5.11);  Surgeon: Fabio Pierce, MD;  Location: [facility];  Service: Ophthalmology;  Laterality: Left;   CHOLECYSTECTOMY     CORONARY ARTERY BYPASS GRAFT N/A 10/20/2012   Procedure: CORONARY ARTERY BYPASS GRAFTING (CABG);  Surgeon: Purcell Nails, MD;  Location: [place] OR;  Service: Open Heart Surgery;  Laterality: N/A;   INTRAOPERATIVE TRANSESOPHAGEAL ECHOCARDIOGRAM N/A 10/20/2012   Procedure: INTRAOPERATIVE TRANSESOPHAGEAL ECHOCARDIOGRAM;  Surgeon: Purcell Nails, MD;  Location: [facility] OR;   Service: Open Heart Surgery;  Laterality: N/A;   LEFT HEART CATHETERIZATION WITH CORONARY ANGIOGRAM N/A 10/19/2012   Procedure: LEFT HEART CATHETERIZATION WITH CORONARY ANGIOGRAM;  Surgeon: Chrystie Nose, MD;  Location: [facility] LAB;  Service: Cardiovascular;  Laterality: N/A;   SHOULDER SURGERY  2002    Allergies  No Known Allergies  History of Present Illness    [name] is a 75 y.o. male with a hx of coronary artery disease, hypertension, hyperlipidemia, tobacco use, AAA last seen 03/08/2020 by Dr. Rennis Golden.  Initially evaluated in 2004 with cardiac catheterization with chronic occlusion of the RCA and left-to-right collaterals with insignificant disease in left coronary system.  He was treated medically.  He had repeat cardiac catheterization April 2014 showing left main and severe three-vessel coronary artery disease with preserved LVEF.  He underwent CABG with LIMA to LAD, SVG to OM, SVG to PDA.  He was last seen in clinic August 2021 by Dr. Rennis Golden.  He was doing overall from a cardiac perspective.  His dyspnea was improving with treatment of his COPD.  He was noting some orthostasis and his blood pressure regimen was reduced.  He presents today for follow-up.  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  He notes he has been started on Trelegy for his COPD and he has marked improvement in his dyspnea.  Blood pressure at home routinely 1 10-1 20 over 70s.  He reports he only gets  dizziness when he has his inner ear problems that he knows how to get out of bed so this does not happen.  He reports no chest pain, pressure, tightness.  He is very active helping care for his wife.  EKGs/Labs/Other Studies Reviewed:   The following studies were reviewed today:  EKG:  EKG is  ordered today.  The ekg ordered today demonstrates SB 52 bpm with incomplete RBBB, no acute St/T wave changes.   Recent Labs: 01/03/2021: Creatinine, Ser 0.90  Recent Lipid Panel    Component Value Date/Time    CHOL 117 10/19/2012 0455   TRIG 83 10/19/2012 0455   HDL 33 (L) 10/19/2012 0455   CHOLHDL 3.5 10/19/2012 0455   VLDL 17 10/19/2012 0455   LDLCALC 67 10/19/2012 0455   Home Medications   Current Meds  Medication Sig   amLODipine (NORVASC) 2.5 MG tablet Take 1 tablet (2.5 mg total) by mouth daily.   aspirin EC 81 MG tablet Take 81 mg by mouth daily.   atorvastatin (LIPITOR) 40 MG tablet Take 40 mg by mouth daily.   Fluticasone-Umeclidin-Vilant (TRELEGY ELLIPTA) 100-62.5-25 MCG/INH AEPB Inhale into the lungs.   ibuprofen (ADVIL) 200 MG tablet Take 200 mg by mouth every 6 (six) hours as needed.   metoprolol tartrate (LOPRESSOR) 25 MG tablet Take 1 tablet (25 mg total) by mouth 2 (two) times daily.   quinapril-hydrochlorothiazide (ACCURETIC) 10-12.5 MG tablet Take 1 tablet by mouth daily.   [DISCONTINUED] Fluticasone-Salmeterol (ADVAIR) 250-50 MCG/DOSE AEPB Inhale 1 puff into the lungs 2 (two) times daily.   [DISCONTINUED] tiotropium (SPIRIVA) 18 MCG inhalation capsule Place 18 mcg into inhaler and inhale daily.     Review of Systems      All other systems reviewed and are otherwise negative except as noted above.  Physical Exam    VS:  BP 116/76   Pulse (!) 52   Ht 5\' 7"  (1.702 m)   Wt 212 lb (96.2 kg)   SpO2 91%   BMI 33.20 kg/m  , BMI Body mass index is 33.2 kg/m.  Wt Readings from Last 3 Encounters:  02/11/21 212 lb (96.2 kg)  01/16/21 212 lb (96.2 kg)  12/19/20 212 lb (96.2 kg)     GEN: Well nourished, well developed, in no acute distress. HEENT: normal. Neck: Supple, no JVD, carotid bruits, or masses. Cardiac: RRR, no murmurs, rubs, or gallops. No clubbing, cyanosis, edema.  Radials/PT 2+ and equal bilaterally.  Respiratory:  Respirations regular and unlabored, clear to auscultation bilaterally. GI: Soft, nontender, nondistended. MS: No deformity or atrophy. Skin: Warm and dry, no rash. Neuro:  Strength and sensation are intact. Psych: Normal affect.  Assessment  & Plan    Preoperative cardiovascular examination -upcoming endovascular repair of infrarenal AAA measuring 7.4 cm with Dr. 02/18/21 of vascular surgery. According to the Revised Cardiac Risk Index (RCRI), his Perioperative Risk of Major Cardiac Event is (%): 6.6. His Functional Capacity in METs is: 6.05 according to the Duke Activity Status Index (DASI).  He is deemed acceptable risk for the planned procedure without additional cardiovascular testing.  Will route to vascular surgery team so they are aware.  CAD s/p CABG-EKG today with no acute ST/T changes.  Stable with no anginal symptoms. No indication for ischemic evaluation.  GDMT includes aspirin, metoprolol, atorvastatin. Heart healthy diet and regular cardiovascular exercise encouraged.    AAA -following with Dr. Gilda Crease vascular surgery.  Endovascular repair upcoming.  HTN - BP well controlled. Continue current antihypertensive regimen.  HLD -continue atorvastatin 40 mg daily.  COPD - Much improvement with Trelegy. Continue to follow with PCP.   Disposition: Follow up in 6 month(s) with Dr. Rennis Golden or APP.  Signed, Alver Sorrow, NP 02/11/2021, 1:27 PM [facility]

## 2021-02-12 ENCOUNTER — Encounter (HOSPITAL_BASED_OUTPATIENT_CLINIC_OR_DEPARTMENT_OTHER): Payer: Self-pay | Admitting: Family

## 2021-02-22 ENCOUNTER — Emergency Department: Payer: Medicare Other

## 2021-02-22 ENCOUNTER — Other Ambulatory Visit: Payer: Self-pay

## 2021-02-22 ENCOUNTER — Observation Stay
Admission: EM | Admit: 2021-02-22 | Discharge: 2021-02-24 | Disposition: A | Discharge status: Home / Self Care | Payer: Medicare Other | Attending: Internal Medicine | Admitting: Internal Medicine

## 2021-02-22 DIAGNOSIS — Z87891 Personal history of nicotine dependence: Secondary | ICD-10-CM | POA: Diagnosis not present

## 2021-02-22 DIAGNOSIS — Z79899 Other long term (current) drug therapy: Secondary | ICD-10-CM | POA: Diagnosis not present

## 2021-02-22 DIAGNOSIS — Z7982 Long term (current) use of aspirin: Secondary | ICD-10-CM | POA: Diagnosis not present

## 2021-02-22 DIAGNOSIS — J449 Chronic obstructive pulmonary disease, unspecified: Secondary | ICD-10-CM | POA: Diagnosis not present

## 2021-02-22 DIAGNOSIS — I10 Essential (primary) hypertension: Secondary | ICD-10-CM | POA: Diagnosis present

## 2021-02-22 DIAGNOSIS — R52 Pain, unspecified: Secondary | ICD-10-CM

## 2021-02-22 DIAGNOSIS — E871 Hypo-osmolality and hyponatremia: Secondary | ICD-10-CM | POA: Diagnosis not present

## 2021-02-22 DIAGNOSIS — Z20822 Contact with and (suspected) exposure to covid-19: Secondary | ICD-10-CM | POA: Insufficient documentation

## 2021-02-22 DIAGNOSIS — Z23 Encounter for immunization: Secondary | ICD-10-CM | POA: Insufficient documentation

## 2021-02-22 DIAGNOSIS — Z951 Presence of aortocoronary bypass graft: Secondary | ICD-10-CM | POA: Diagnosis not present

## 2021-02-22 DIAGNOSIS — M7061 Trochanteric bursitis, right hip: Principal | ICD-10-CM | POA: Diagnosis present

## 2021-02-22 DIAGNOSIS — I2510 Atherosclerotic heart disease of native coronary artery without angina pectoris: Secondary | ICD-10-CM | POA: Insufficient documentation

## 2021-02-22 DIAGNOSIS — M25551 Pain in right hip: Secondary | ICD-10-CM | POA: Diagnosis present

## 2021-02-22 DIAGNOSIS — Y9389 Activity, other specified: Secondary | ICD-10-CM | POA: Insufficient documentation

## 2021-02-22 LAB — COMPREHENSIVE METABOLIC PANEL
ALT: 21 U/L (ref 0–44)
AST: 25 U/L (ref 15–41)
Albumin: 3.3 g/dL — ABNORMAL LOW (ref 3.5–5.0)
Alkaline Phosphatase: 70 U/L (ref 38–126)
Anion gap: 7 (ref 5–15)
BUN: 13 mg/dL (ref 8–23)
CO2: 27 mmol/L (ref 22–32)
Calcium: 7.9 mg/dL — ABNORMAL LOW (ref 8.9–10.3)
Chloride: 101 mmol/L (ref 98–111)
Creatinine, Ser: 0.91 mg/dL (ref 0.61–1.24)
GFR, Estimated: >60 mL/min (ref >60)
Glucose, Bld: 90 mg/dL (ref 70–99)
Potassium: 3.9 mmol/L (ref 3.5–5.1)
Sodium: 135 mmol/L (ref 135–145)
Total Bilirubin: 0.9 mg/dL (ref 0.3–1.2)
Total Protein: 5.8 g/dL — ABNORMAL LOW (ref 6.5–8.1)

## 2021-02-22 LAB — CBC WITH DIFFERENTIAL/PLATELET
Abs Immature Granulocytes: 0.02 10*3/uL (ref 0.00–0.07)
Basophils Absolute: 0 10*3/uL (ref 0.0–0.1)
Basophils Relative: 0 %
Eosinophils Absolute: 0.1 10*3/uL (ref 0.0–0.5)
Eosinophils Relative: 1 %
HCT: 41.7 % (ref 39.0–52.0)
Hemoglobin: 14.7 g/dL (ref 13.0–17.0)
Immature Granulocytes: 0 %
Lymphocytes Relative: 11 %
Lymphs Abs: 1.1 10*3/uL (ref 0.7–4.0)
MCH: 31.8 pg (ref 26.0–34.0)
MCHC: 35.3 g/dL (ref 30.0–36.0)
MCV: 90.3 fL (ref 80.0–100.0)
Monocytes Absolute: 0.8 10*3/uL (ref 0.1–1.0)
Monocytes Relative: 8 %
Neutro Abs: 8 10*3/uL — ABNORMAL HIGH (ref 1.7–7.7)
Neutrophils Relative %: 80 %
Platelets: 186 10*3/uL (ref 150–400)
RBC: 4.62 MIL/uL (ref 4.22–5.81)
RDW: 11.9 % (ref 11.5–15.5)
WBC: 10 10*3/uL (ref 4.0–10.5)
nRBC: 0 % (ref 0.0–0.2)

## 2021-02-22 MED ORDER — ALBUTEROL SULFATE (2.5 MG/3ML) 0.083% IN NEBU: 2.5000 mg | INHALATION_SOLUTION | RESPIRATORY_TRACT | Status: DC | PRN

## 2021-02-22 MED ORDER — ATORVASTATIN CALCIUM 20 MG PO TABS
40.0000 mg | ORAL_TABLET | Freq: Every day | ORAL | Status: DC
  Administered 2021-02-22 – 2021-02-24 (×3): 40 mg via ORAL
  Filled 2021-02-22 (×3): qty 2

## 2021-02-22 MED ORDER — ONDANSETRON HCL 4 MG PO TABS: 4.0000 mg | ORAL_TABLET | Freq: Four times a day (QID) | ORAL | Status: DC | PRN

## 2021-02-22 MED ORDER — ACETAMINOPHEN 325 MG PO TABS: 650.0000 mg | ORAL_TABLET | Freq: Four times a day (QID) | ORAL | Status: DC | PRN

## 2021-02-22 MED ORDER — HYDROMORPHONE HCL 1 MG/ML IJ SOLN
1.0000 mg | Freq: Once | INTRAMUSCULAR | Status: AC
  Administered 2021-02-22: 1 mg via INTRAVENOUS
  Filled 2021-02-22: qty 1

## 2021-02-22 MED ORDER — ONDANSETRON HCL 4 MG/2ML IJ SOLN: 4.0000 mg | Freq: Four times a day (QID) | INTRAMUSCULAR | Status: DC | PRN

## 2021-02-22 MED ORDER — ORPHENADRINE CITRATE 30 MG/ML IJ SOLN
60.0000 mg | Freq: Once | INTRAMUSCULAR | Status: AC
  Administered 2021-02-22: 60 mg via INTRAVENOUS
  Filled 2021-02-22: qty 2

## 2021-02-22 MED ORDER — METHOCARBAMOL 1000 MG/10ML IJ SOLN
500.0000 mg | Freq: Four times a day (QID) | INTRAVENOUS | Status: DC | PRN
  Administered 2021-02-23 – 2021-02-24 (×2): 500 mg via INTRAVENOUS
  Filled 2021-02-22 (×3): qty 5

## 2021-02-22 MED ORDER — KETOROLAC TROMETHAMINE 15 MG/ML IJ SOLN
15.0000 mg | Freq: Once | INTRAMUSCULAR | Status: AC
  Administered 2021-02-22: 15 mg via INTRAVENOUS
  Filled 2021-02-22: qty 1

## 2021-02-22 MED ORDER — PNEUMOCOCCAL VAC POLYVALENT 25 MCG/0.5ML IJ INJ
0.5000 mL | INJECTION | INTRAMUSCULAR | Status: AC
  Administered 2021-02-23: 0.5 mL via INTRAMUSCULAR
  Filled 2021-02-22: qty 0.5

## 2021-02-22 MED ORDER — DEXAMETHASONE SODIUM PHOSPHATE 10 MG/ML IJ SOLN
10.0000 mg | Freq: Once | INTRAMUSCULAR | Status: AC
  Administered 2021-02-22: 10 mg via INTRAVENOUS
  Filled 2021-02-22: qty 1

## 2021-02-22 MED ORDER — AMLODIPINE BESYLATE 5 MG PO TABS
2.5000 mg | ORAL_TABLET | Freq: Every day | ORAL | Status: DC
  Administered 2021-02-23 – 2021-02-24 (×2): 2.5 mg via ORAL
  Filled 2021-02-22 (×2): qty 1

## 2021-02-22 MED ORDER — OXYCODONE-ACETAMINOPHEN 5-325 MG PO TABS
1.0000 | ORAL_TABLET | Freq: Four times a day (QID) | ORAL | Status: DC | PRN
  Administered 2021-02-22 – 2021-02-24 (×3): 2 via ORAL
  Filled 2021-02-22 (×3): qty 2

## 2021-02-22 MED ORDER — PREDNISONE 20 MG PO TABS
40.0000 mg | ORAL_TABLET | Freq: Every day | ORAL | Status: DC
  Administered 2021-02-23 – 2021-02-24 (×2): 40 mg via ORAL
  Filled 2021-02-22 (×2): qty 2

## 2021-02-22 MED ORDER — BUPIVACAINE HCL (PF) 0.5 % IJ SOLN
50.0000 mL | Freq: Once | INTRAMUSCULAR | Status: AC
  Administered 2021-02-22: 50 mL

## 2021-02-22 MED ORDER — MORPHINE SULFATE (PF) 4 MG/ML IV SOLN
4.0000 mg | Freq: Once | INTRAVENOUS | Status: AC
  Administered 2021-02-22: 4 mg via INTRAVENOUS
  Filled 2021-02-22: qty 1

## 2021-02-22 MED ORDER — METOPROLOL TARTRATE 25 MG PO TABS
25.0000 mg | ORAL_TABLET | Freq: Two times a day (BID) | ORAL | Status: DC
  Administered 2021-02-23 – 2021-02-24 (×2): 25 mg via ORAL
  Filled 2021-02-22 (×2): qty 1

## 2021-02-22 MED ORDER — ACETAMINOPHEN 650 MG RE SUPP: 650.0000 mg | Freq: Four times a day (QID) | RECTAL | Status: DC | PRN

## 2021-02-22 MED ORDER — FAMOTIDINE 20 MG PO TABS
20.0000 mg | ORAL_TABLET | Freq: Two times a day (BID) | ORAL | Status: DC
  Administered 2021-02-23 – 2021-02-24 (×3): 20 mg via ORAL
  Filled 2021-02-22 (×3): qty 1

## 2021-02-22 MED ORDER — ASPIRIN EC 81 MG PO TBEC
81.0000 mg | DELAYED_RELEASE_TABLET | Freq: Every day | ORAL | Status: DC
  Administered 2021-02-23 – 2021-02-24 (×2): 81 mg via ORAL
  Filled 2021-02-22 (×2): qty 1

## 2021-02-22 MED ORDER — NAPROXEN 375 MG PO TABS
375.0000 mg | ORAL_TABLET | Freq: Two times a day (BID) | ORAL | Status: DC
  Administered 2021-02-23 – 2021-02-24 (×3): 375 mg via ORAL
  Filled 2021-02-22 (×4): qty 1

## 2021-02-22 NOTE — H&P (Signed)
History and Physical    Levi LikeGary F [name] UJW:[id number]:[id number] DOB: 03-17-1946 DOA: 02/22/2021  PCP: Leanna SatoMiles, Linda M, MD   Patient coming from: Home  I have personally briefly reviewed patient's old medical records in [facility]  CC: right leg/hip pain HPI: 75 yo WM with hx of COPD, HTN, CAD, presents to ER with 2-3 weeks of gradually worsening right hip pain. Pain located on right greater trochanter. Occ pain in right groin with radiation to right medial thigh. Progressive worsening of his inability to walk over the last several days. Today, pt unable to even stand without severe pain. Pt can to ER for evaluation. Labs, CT right hip unremarkable. Pt still unable to stand and refusing to walk. [facility] consulted for admission due to right hip pain and inability to walk. 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    ED Course: CT right hip negative for fracture. Pt given multiple rounds of IV opiates with only partial relief of his right hip pain.  Review of Systems:  Review of Systems  Constitutional: Negative.   HENT: Negative.    Eyes: Negative.   Respiratory: Negative.    Cardiovascular: Negative.   Musculoskeletal:  Positive for back pain.       Right hip pain. Radiation to right medial thigh. Some pain in right lower back. No saddle anesthesia. No difficulty urinating. No bowel or bladder incontinence.  Skin: Negative.   Neurological: Negative.        Unable to walk due to pain in right greater trochanter area.  Endo/Heme/Allergies: Negative.   Psychiatric/Behavioral: Negative.     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 Cornelius Moraswen)   Varicose veins      Past Surgical History:  Procedure Laterality Date   CARDIAC CATHETERIZATION  03/26/2003   occluded RCA with collaterals from L to R, 60% mid Cfx disease, 40% branch disease to OM, LAD with 50% stenosis (Dr. Laurell Josephs. McQueen)    CATARACT EXTRACTION W/PHACO Right 08/19/2018   Procedure: CATARACT EXTRACTION PHACO AND INTRAOCULAR LENS PLACEMENT (IOC);  Surgeon: Fabio PierceWrzosek, James, MD;  Location: [facility];  Service: Ophthalmology;  Laterality: Right;  CDE: 9.14   CATARACT EXTRACTION W/PHACO Left 09/27/2018   Procedure: CATARACT EXTRACTION PHACO AND INTRAOCULAR LENS PLACEMENT LEFT EYE  (CDE: 5.11);  Surgeon: Fabio PierceWrzosek, James, MD;  Location: [facility];  Service: Ophthalmology;  Laterality: Left;   CHOLECYSTECTOMY     CORONARY ARTERY BYPASS GRAFT N/A 10/20/2012   Procedure: CORONARY ARTERY BYPASS GRAFTING (CABG);  Surgeon: Purcell Nailslarence H [name], MD;  [facility];  Service: Open Heart Surgery;  Laterality: N/A;   INTRAOPERATIVE TRANSESOPHAGEAL ECHOCARDIOGRAM N/A 10/20/2012   Procedure: INTRAOPERATIVE TRANSESOPHAGEAL ECHOCARDIOGRAM;  Surgeon: Purcell Nailslarence H [name], MD;  [facility];  Service: Open Heart Surgery;  Laterality: N/A;   LEFT HEART CATHETERIZATION WITH CORONARY ANGIOGRAM N/A 10/19/2012   Procedure: LEFT HEART CATHETERIZATION WITH CORONARY ANGIOGRAM;  Surgeon: Chrystie NoseKenneth C. Hilty, MD;  [facility];  Service: Cardiovascular;  Laterality: N/A;   SHOULDER SURGERY  2002     reports that he quit smoking about  8 years ago. His smoking use included cigarettes. He smoked an average of 2 packs per day. He has never used smokeless tobacco. He reports that he does not drink alcohol and does not use drugs.  No Known Allergies  Family History  Problem Relation Age of Onset   Coronary artery disease Mother     Prior to Admission medications   Medication Sig Start Date End Date Taking? Authorizing Provider  amLODipine (NORVASC) 2.5 MG tablet Take 1 tablet (2.5 mg total) by mouth daily. 03/08/20   Chrystie Nose, MD   aspirin EC 81 MG tablet Take 81 mg by mouth daily.    [provider]  atorvastatin (LIPITOR) 40 MG tablet Take 40 mg by mouth daily.    [provider]  Fluticasone-Umeclidin-Vilant (TRELEGY ELLIPTA) 100-62.5-25 MCG/INH AEPB Inhale into the lungs. 11/12/20   [provider]  ibuprofen (ADVIL) 200 MG tablet Take 200 mg by mouth every 6 (six) hours as needed.    [provider]  metoprolol tartrate (LOPRESSOR) 25 MG tablet Take 1 tablet (25 mg total) by mouth 2 (two) times daily. 03/08/20   Hilty, Lisette Abu, MD  quinapril-hydrochlorothiazide (ACCURETIC) 10-12.5 MG tablet Take 1 tablet by mouth daily. 03/08/20   Chrystie Nose, MD    Physical Exam: Vitals:   02/22/21 1009 02/22/21 1013 02/22/21 1554  BP:  135/68 (!) 157/72  Pulse:  (!) 52 (!) 59  Resp:  18 (!) 26  Temp:  98 F (36.7 C)   TempSrc:  Oral   SpO2:  96% 91%  Weight: 96 kg    Height: 5\' 7"  (1.702 m)      Physical Exam Vitals and nursing note reviewed.  Constitutional:      General: He is not in acute distress.    Appearance: He is obese. He is not ill-appearing, toxic-appearing or diaphoretic.  HENT:     Head: Normocephalic and atraumatic.     Nose: Nose normal.  Eyes:     Pupils: Pupils are equal, round, and reactive to light.  Cardiovascular:     Rate and Rhythm: Normal rate and regular rhythm.  Pulmonary:     Effort: Pulmonary effort is normal. No respiratory distress.     Breath sounds: No wheezing or rales.  Abdominal:     General: Bowel sounds are normal. There is no distension.     Palpations: Abdomen is soft.     Tenderness: There is no abdominal tenderness. There is no guarding.  Musculoskeletal:     Right lower leg: No edema.     Left lower leg: No edema.     Comments: Point tenderness over right greater trochanter area. Able to visualize fluid in right trochanter bursa on U/S  Skin:    General: Skin is warm and dry.     Capillary Refill: Capillary refill takes less  than 2 seconds.  Neurological:     General: No focal deficit present.     Mental Status: He is alert and oriented to person, place, and time.     Labs on Admission: I have personally reviewed following labs and imaging studies  CBC: Recent Labs  Lab 02/22/21 1654  WBC 10.0  NEUTROABS 8.0*  HGB 14.7  HCT 41.7  MCV 90.3  PLT 186   Basic Metabolic Panel: Recent Labs  Lab 02/22/21 1654  NA 135  K 3.9  CL 101  CO2 27  GLUCOSE 90  BUN 13  CREATININE 0.91  CALCIUM 7.9*  GFR: Estimated Creatinine Clearance: 76.3 mL/min (by C-G formula based on SCr of 0.91 mg/dL). Liver Function Tests: Recent Labs  Lab 02/22/21 1654  AST 25  ALT 21  ALKPHOS 70  BILITOT 0.9  PROT 5.8*  ALBUMIN 3.3*   No results for input(s): LIPASE, AMYLASE in the last 168 hours. No results for input(s): AMMONIA in the last 168 hours. Coagulation Profile: No results for input(s): INR, PROTIME in the last 168 hours. Cardiac Enzymes: No results for input(s): CKTOTAL, CKMB, CKMBINDEX, TROPONINI in the last 168 hours. BNP (last 3 results) No results for input(s): PROBNP in the last 8760 hours. HbA1C: No results for input(s): HGBA1C in the last 72 hours. CBG: No results for input(s): GLUCAP in the last 168 hours. Lipid Profile: No results for input(s): CHOL, HDL, LDLCALC, TRIG, CHOLHDL, LDLDIRECT in the last 72 hours. Thyroid Function Tests: No results for input(s): TSH, T4TOTAL, FREET4, T3FREE, THYROIDAB in the last 72 hours. Anemia Panel: No results for input(s): VITAMINB12, FOLATE, FERRITIN, TIBC, IRON, RETICCTPCT in the last 72 hours. Urine analysis:    Component Value Date/Time   COLORURINE YELLOW 10/19/2012 2323   APPEARANCEUR CLEAR 10/19/2012 2323   LABSPEC 1.012 10/19/2012 2323   PHURINE 6.5 10/19/2012 2323   GLUCOSEU NEGATIVE 10/19/2012 2323   HGBUR NEGATIVE 10/19/2012 2323   BILIRUBINUR NEGATIVE 10/19/2012 2323   KETONESUR NEGATIVE 10/19/2012 2323   PROTEINUR NEGATIVE 10/19/2012  2323   UROBILINOGEN 1.0 10/19/2012 2323   NITRITE NEGATIVE 10/19/2012 2323   LEUKOCYTESUR NEGATIVE 10/19/2012 2323    Radiological Exams on Admission: I have personally reviewed images DG Lumbar Spine 2-3 Views  Result Date: 02/22/2021 CLINICAL DATA:  75 year old male with a history of sharp right hip pain EXAM: LUMBAR SPINE - 2-3 VIEW COMPARISON:  None. FINDINGS: Lumbar Spine: Lumbar vertebral elements relatively aligned. Trace anterolisthesis of L4 on L5. No acute fracture line identified. Vertebral body heights maintained. Disc space is mildly narrowed with endplate changes at all lumbar levels. Vacuum disc phenomenon at L5-S1 without significant height loss. Facet hypertrophy most pronounced at L5-S1 and less so at L4-L5. Calcifications of the abdominal aorta and iliac arteries, with saccular aneurysm better demonstrated on prior CT. IMPRESSION: Negative for acute fracture or malalignment of the lumbar spine. 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 Note that it does not appear the patient has undergone interval repair of his previously identified 7.7 cm abdominal aortic aneurysm, on CT dated 01/03/2021. Aortic Atherosclerosis (ICD10-I70.0). Electronically Signed   By: Gilmer Mor D.O.   On: 02/22/2021 13:08   CT Hip Right Wo Contrast  Result Date: 02/22/2021 CLINICAL DATA:  Hip pain, stress fracture suspected, neg xray. No history of trauma EXAM: CT OF THE RIGHT HIP WITHOUT CONTRAST TECHNIQUE: Multidetector CT imaging of the right hip was performed according to the standard protocol. Multiplanar CT image reconstructions were also generated. COMPARISON:  X-ray 02/22/2021 FINDINGS: Bones/Joint/Cartilage Right hip is intact without fracture or dislocation. The visualized portion of the right hemipelvis is intact without evidence of fracture or diastasis. Mild arthropathy of the right hip joint. 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 No  hip joint effusion is seen. No lytic or sclerotic bony lesion. Ligaments Suboptimally assessed by CT. Muscles and Tendons No acute musculotendinous injury by CT. Soft tissues No soft tissue edema or fluid collection. No right inguinal lymphadenopathy. Atherosclerotic vascular calcifications are present. IMPRESSION: 1. No acute osseous abnormality of the right hip. 2. Mild arthropathy of the  right hip joint. Electronically Signed   By: Duanne Guess D.O.   On: 02/22/2021 15:15   DG Hip Unilat W or Wo Pelvis 2-3 Views Right  Result Date: 02/22/2021 CLINICAL DATA:  Lambert Mody right hip pain, no history of trauma. EXAM: DG HIP (WITH OR WITHOUT PELVIS) 2-3V RIGHT COMPARISON:  CT abdomen pelvis dated 01/03/2021. FINDINGS: There is no evidence of hip fracture or dislocation. Mild degenerative changes are seen in both hips. IMPRESSION: Mild degenerative changes. Electronically Signed   By: Romona Curls M.D.   On: 02/22/2021 13:12    EKG: I have personally reviewed EKG: no EKG to review  Assessment/Plan Principal Problem:   Trochanteric bursitis of right hip Active Problems:   HTN (hypertension)   COPD, severe (HCC)    Trochanteric bursitis of right hip Admit to observation for pain control. 25 ml of 0.5% marcaine injected into right greater trochanter bursa under direct ultrasound guidance. Prior to injection, pt unable to even sit upright due to pain in his right leg. Pt unable to even stand up.  After injection, pt able to sit upright with leg dangling off ER burney without assistance. Pt also able to stand for 10 seconds unassisted. Pt could not do this before injection.  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  Will observe pt overnight to give IV decadron some time to work. Pt will likely need some po prednisone for 4-5 days. 15 mg IV toradol given for pain relief. Will start naproxen as well. PT assessment  tomorrow.  HTN (hypertension) stable  COPD, severe (HCC) Chronic.  DVT prophylaxis: SCDs Code Status: Full Code Family Communication: discussed with pt and his cousin at bedside  Disposition Plan: DC to home  Consults called: none  Admission status: Observation, Med-Surg   Carollee Herter, DO [facility] 02/22/2021, 5:39 PM

## 2021-02-22 NOTE — Subjective & Objective (Signed)
CC: right leg/hip pain HPI: 75 yo WM with hx of COPD, HTN, CAD, presents to ER with 2-3 weeks of gradually worsening right hip pain. Pain located on right greater trochanter. Occ pain in right groin with radiation to right medial thigh. Progressive worsening of his inability to walk over the last several days. Today, pt unable to even stand without severe pain. Pt can to ER for evaluation. Labs, CT right hip unremarkable. Pt still unable to stand and refusing to walk. TRH consulted for admission due to right hip pain and inability to walk. 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

## 2021-02-22 NOTE — Assessment & Plan Note (Signed)
Admit to observation for pain control. 25 ml of 0.5% marcaine injected into right greater trochanter bursa under direct ultrasound guidance. Prior to injection, pt unable to even sit upright due to pain in his right leg. Pt unable to even stand up.  After injection, pt able to sit upright with leg dangling off ER burney without assistance. Pt also able to stand for 10 seconds unassisted. Pt could not do this before injection.  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  Will observe pt overnight to give IV decadron some time to work. Pt will likely need some po prednisone for 4-5 days. 15 mg IV toradol given for pain relief. Will start naproxen as well. PT assessment tomorrow.

## 2021-02-22 NOTE — ED Provider Notes (Signed)
[facility] Emergency Department Provider Note  ____________________________________________  Time seen: Approximately 12:22 PM  I have reviewed the triage vital signs and the nursing notes.   HISTORY  Chief Complaint Hip Pain    HPI [name] is a 75 y.o. male who presents the emergency department complaining of right hip pain.  Patient states that he has had some intermittent mild symptoms over the past several weeks.  He has had more of a "giving out" sensation in his hip after activity.  This is slowly progressed into pain and states that this morning the pain was unbearable.  Patient was ambulating back to the couch from the restroom and the pain had hit it intensely.  He denied any trauma.  He had not fallen.  He denied any popping sensation.  Patient states that he tried to lower himself down to the couch and collapsed due to the pain.  He did not fall or injure the hip at this time.  He did not hit his head or lose consciousness.  Patient states that if he can resting it comfortable the pain decreases, however with any movement the pain drastically intensifies.  Patient states that the best he can describe this pain as the feeling of a kidney stone in his hip.  He states that the sensation is also worse than his previous kidney stones.  Patient has a history of COPD, CAD, nephrolithiasis, hypertension, CABG.  Patient has a AAA that is in the process of being scheduled for surgery.  Patient denies any back pain.  No abdominal pain.       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 Laurell Josephs)    CATARACT EXTRACTION W/PHACO Right 08/19/2018   Procedure: CATARACT EXTRACTION PHACO AND INTRAOCULAR LENS PLACEMENT (IOC);  Surgeon: Fabio Pierce, MD;  Location: [facility];  Service: Ophthalmology;  Laterality: Right;  CDE: 9.14   CATARACT EXTRACTION W/PHACO Left 09/27/2018   Procedure: CATARACT EXTRACTION PHACO AND INTRAOCULAR LENS PLACEMENT LEFT EYE  (CDE: 5.11);  Surgeon: Fabio Pierce, MD;  Location: [facility];  Service: Ophthalmology;  Laterality: Left;   CHOLECYSTECTOMY     CORONARY ARTERY BYPASS  GRAFT N/A 10/20/2012   Procedure: CORONARY ARTERY BYPASS GRAFTING (CABG);  Surgeon: Purcell Nails, MD;  Location: [facility] OR;  Service: Open Heart Surgery;  Laterality: N/A;   INTRAOPERATIVE TRANSESOPHAGEAL ECHOCARDIOGRAM N/A 10/20/2012   Procedure: INTRAOPERATIVE TRANSESOPHAGEAL ECHOCARDIOGRAM;  Surgeon: Purcell Nails, MD;  Location: [facility] OR;  Service: Open Heart Surgery;  Laterality: N/A;   LEFT HEART CATHETERIZATION WITH CORONARY ANGIOGRAM N/A 10/19/2012   Procedure: LEFT HEART CATHETERIZATION WITH CORONARY ANGIOGRAM;  Surgeon: Chrystie Nose, MD;  Location: [facility] CATH  LAB;  Service: Cardiovascular;  Laterality: N/A;   SHOULDER SURGERY  2002    Prior to Admission medications   Medication Sig Start Date End Date Taking? Authorizing Provider  amLODipine (NORVASC) 2.5 MG tablet Take 1 tablet (2.5 mg total) by mouth daily. 03/08/20   Chrystie Nose, MD  aspirin EC 81 MG tablet Take 81 mg by mouth daily.    [provider]  atorvastatin (LIPITOR) 40 MG tablet Take 40 mg by mouth daily.    [provider]  Fluticasone-Umeclidin-Vilant (TRELEGY ELLIPTA) 100-62.5-25 MCG/INH AEPB Inhale into the lungs. 11/12/20   [provider]  ibuprofen (ADVIL) 200 MG tablet Take 200 mg by mouth every 6 (six) hours as needed.    [provider]  metoprolol tartrate (LOPRESSOR) 25 MG tablet Take 1 tablet (25 mg total) by mouth 2 (two) times daily. 03/08/20   Hilty, Lisette Abu, MD  quinapril-hydrochlorothiazide (ACCURETIC) 10-12.5 MG tablet Take 1 tablet by mouth daily. 03/08/20   Hilty, Lisette Abu, MD    Allergies Patient has no known allergies.  Family History  Problem Relation Age of Onset   Coronary artery disease Mother     Social History Social History   Tobacco Use   Smoking status: Former    Packs/day: 2.00    Types: Cigarettes    Quit date: 10/11/2012    Years since quitting: 8.3   Smokeless tobacco: Never  Substance Use Topics   Alcohol use: No   Drug use: No     Review of Systems  Constitutional: No fever/chills Eyes: No visual changes. No discharge ENT: No upper respiratory complaints. Cardiovascular: no chest pain. Respiratory: no cough. No SOB. Gastrointestinal: No abdominal pain.  No nausea, no vomiting.  No diarrhea.  No constipation. Genitourinary: Negative for dysuria. No hematuria Musculoskeletal: Positive for right hip pain Skin: Negative for rash, abrasions, lacerations, ecchymosis. Neurological: Negative for headaches, focal weakness or numbness.  10 System ROS otherwise  negative.  ____________________________________________   PHYSICAL EXAM:  VITAL SIGNS: ED Triage Vitals  Enc Vitals Group     BP 02/22/21 1013 135/68     Pulse Rate 02/22/21 1013 (!) 52     Resp 02/22/21 1013 18     Temp 02/22/21 1013 98 F (36.7 C)     Temp Source 02/22/21 1013 Oral     SpO2 02/22/21 1013 96 %     Weight 02/22/21 1009 211 lb 10.3 oz (96 kg)     Height 02/22/21 1009  (1.702 m)     Head Circumference --      Peak Flow --      Pain Score 02/22/21 1009 10     Pain Loc --      Pain Edu? --      Excl. in GC? --      Constitutional: Alert and oriented. Well appearing and in no acute distress. Eyes: Conjunctivae are normal. PERRL. EOMI. Head: Atraumatic. ENT:  Ears:       Nose: No congestion/rhinnorhea.      Mouth/Throat: Mucous membranes are moist.  Neck: No stridor.    Cardiovascular: Normal rate, regular rhythm. Normal S1 and S2.  Good peripheral circulation. Respiratory: Normal respiratory effort without tachypnea or retractions. Lungs CTAB. Good air entry to the bases with no decreased or absent breath sounds. Gastrointestinal: Bowel sounds 4 quadrants. Soft and nontender to palpation. No guarding or rigidity. No palpable masses. No distention. No CVA tenderness. Musculoskeletal: Full range of motion to all extremities. No gross deformities appreciated.  Visualization of the right hip reveals no deformity.  No shortening or rotation of the right lower extremity.  Patient is tender palpation along the right inguinal fold extending into the lateral and posterior hip.  No palpable abnormality.  Limited range of motion due to pain.  No tenderness in the lumbar spine. Neurologic:  Normal speech and language. No gross focal neurologic deficits are appreciated.  Skin:  Skin is warm, dry and intact. No rash noted. Psychiatric: Mood and affect are normal. Speech and behavior are normal. Patient exhibits appropriate insight and  judgement.   ____________________________________________   LABS (all labs ordered are listed, but only abnormal results are displayed)  Labs Reviewed  COMPREHENSIVE METABOLIC PANEL - Abnormal; Notable for the following components:      Result Value   Calcium 7.9 (*)    Total Protein 5.8 (*)    Albumin 3.3 (*)    All other components within normal limits  CBC WITH DIFFERENTIAL/PLATELET - Abnormal; Notable for the following components:   Neutro Abs 8.0 (*)    All other components within normal limits  SARS CORONAVIRUS 2 (TAT 6-24 HRS)   ____________________________________________  EKG   ____________________________________________  RADIOLOGY I personally viewed and evaluated these images as part of my medical decision making, as well as reviewing the written report by the radiologist.  ED Provider Interpretation: Imaging reveals no acute traumatic findings.  DG Lumbar Spine 2-3 Views  Result Date: 02/22/2021 CLINICAL DATA:  75 year old male with a history of sharp right hip pain EXAM: LUMBAR SPINE - 2-3 VIEW COMPARISON:  None. FINDINGS: Lumbar Spine: Lumbar vertebral elements relatively aligned. Trace anterolisthesis of L4 on L5. No acute fracture line identified. Vertebral body heights maintained. Disc space is mildly narrowed with endplate changes at all lumbar levels. Vacuum disc phenomenon at L5-S1 without significant height loss. Facet hypertrophy most pronounced at L5-S1 and less so at L4-L5. Calcifications of the abdominal aorta and iliac arteries, with saccular aneurysm better demonstrated on prior CT. IMPRESSION: Negative for acute fracture or malalignment of the lumbar spine. 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 Note that it does not appear the patient has undergone interval repair of his previously identified 7.7 cm abdominal aortic aneurysm, on CT dated 01/03/2021. Aortic Atherosclerosis (ICD10-I70.0). Electronically Signed   By: Gilmer MorJaime   Wagner D.O.   On: 02/22/2021 13:08   CT Hip Right Wo Contrast  Result Date: 02/22/2021 CLINICAL DATA:  Hip pain, stress fracture suspected, neg xray. No history of trauma EXAM: CT OF THE RIGHT HIP WITHOUT CONTRAST TECHNIQUE: Multidetector CT imaging of the right hip was performed according to the standard protocol. Multiplanar CT image reconstructions were also generated. COMPARISON:  X-ray 02/22/2021 FINDINGS: Bones/Joint/Cartilage Right hip is intact without fracture or dislocation. The visualized portion of the right hemipelvis is intact without evidence of fracture or diastasis. Mild arthropathy of the right hip joint. Mild-to-moderate arthropathy  of the pubic symphysis with chondrocalcinosis. No hip joint effusion is seen. No lytic or sclerotic bony lesion. Ligaments Suboptimally assessed by CT. Muscles and Tendons No acute musculotendinous injury by CT. Soft tissues No soft tissue edema or fluid collection. No right inguinal lymphadenopathy. Atherosclerotic vascular calcifications are present. IMPRESSION: 1. No acute osseous abnormality of the right hip. 2. Mild arthropathy of the right hip joint. Electronically Signed   By: Duanne Guess D.O.   On: 02/22/2021 15:15   DG Hip Unilat W or Wo Pelvis 2-3 Views Right  Result Date: 02/22/2021 CLINICAL DATA:  Lambert Mody right hip pain, no history of trauma. EXAM: DG HIP (WITH OR WITHOUT PELVIS) 2-3V RIGHT COMPARISON:  CT abdomen pelvis dated 01/03/2021. FINDINGS: There is no evidence of hip fracture or dislocation. Mild degenerative changes are seen in both hips. IMPRESSION: Mild degenerative changes. Electronically Signed   By: Romona Curls M.D.   On: 02/22/2021 13:12    ____________________________________________    PROCEDURES  Procedure(s) performed:    Procedures    Medications  morphine 4 MG/ML injection 4 mg (4 mg Intravenous Given 02/22/21 1257)  HYDROmorphone (DILAUDID) injection 1 mg (1 mg Intravenous Given 02/22/21 1550)   dexamethasone (DECADRON) injection 10 mg (10 mg Intravenous Given 02/22/21 1656)  bupivacaine (MARCAINE) 0.5 % injection 50 mL (50 mLs Infiltration Given by Other 02/22/21 1705)  HYDROmorphone (DILAUDID) injection 1 mg (1 mg Intravenous Given 02/22/21 1800)  orphenadrine (NORFLEX) injection 60 mg (60 mg Intravenous Given 02/22/21 1800)  ketorolac (TORADOL) 15 MG/ML injection 15 mg (15 mg Intravenous Given 02/22/21 1801)     ____________________________________________   INITIAL IMPRESSION / ASSESSMENT AND PLAN / ED COURSE  Pertinent labs & imaging results that were available during my care of the patient were reviewed by me and considered in my medical decision making (see chart for details).  Review of the [place] CSRS was performed in accordance of the [facility] prior to dispensing any controlled drugs.  Clinical Course as of 02/22/21 1815  Sat Feb 22, 2021  1249 DG Hip Unilat W or Wo Pelvis 2-3 Views Right [NS]    Clinical Course User Index [NS] Cherre Huger          Patient's diagnosis is consistent with trochanteric bursitis, uncontrolled pain.  Patient presented to the emergency department complaining of right hip pain.  Pain was uncontrolled with narcotics.  Imaging revealed no acute findings.  Concern based off physical exam with reassuring work-up that patient had trochanteric bursitis but patient was unable to ambulate even a few steps.  Patient was evaluated by the hospitalist who also agrees that this is likely trochanteric bursitis.  Hospitalist placed an injection into the trochanteric bursa for both diagnostic and therapeutic measures.  This definitely increase patient's range of motion as well as decrease the patient's pain, however he is still unable to bear weight or walk on the right lower extremity.  At this time patient will be admitted to the hospitalist service for intractable pain.     ____________________________________________  FINAL CLINICAL  IMPRESSION(S) / ED DIAGNOSES  Final diagnoses:  Trochanteric bursitis of right hip  Uncontrolled pain      NEW MEDICATIONS STARTED DURING THIS VISIT:  ED Discharge Orders     None           This chart was dictated using voice recognition software/Dragon. Despite best efforts to proofread, errors can occur which can change the meaning. Any change was purely unintentional.    Kawanna Christley,  Delorise Royals, PA-C 02/22/21 1816    Chesley Noon, MD 02/22/21 [id number]

## 2021-02-22 NOTE — Progress Notes (Signed)
Placed on O2 2 L [place]

## 2021-02-22 NOTE — ED Triage Notes (Addendum)
Pt comes ems from home with right hip pain. No falls. Ongoing leg issues for 6 months but yesterday leg pain got worse. States he is unable to bear weight. Pt got 10mg  of morphine from EMS.

## 2021-02-22 NOTE — Assessment & Plan Note (Signed)
stable °

## 2021-02-22 NOTE — Procedures (Signed)
Procedure: right greater trochanter bursa injection Indication: right greater trochanter bursitits Desc: after verbal consent obtained from patient, time out called. Pt right trochanter bursa verified. Pt skin over lying right trochanter bursa prepped with 2% chlorahexidine. Skin anesthetized with 2 ml of 0.5% marcaine. Using a U/S covered with sterile probe cover, right greater trochanter bursa visualized. 22 gauge spinal needle used to puncture bursa under direct U/S guidance. 10 ml of 0.5% marcaine used to anesthetized right trochanter bursa. Spinal needle removed in its entirety.  Complications none:

## 2021-02-22 NOTE — ED Notes (Signed)
Blood tubes sent if needed

## 2021-02-22 NOTE — Assessment & Plan Note (Signed)
Chronic. 

## 2021-02-23 DIAGNOSIS — J449 Chronic obstructive pulmonary disease, unspecified: Secondary | ICD-10-CM

## 2021-02-23 DIAGNOSIS — I10 Essential (primary) hypertension: Secondary | ICD-10-CM | POA: Diagnosis not present

## 2021-02-23 DIAGNOSIS — M7061 Trochanteric bursitis, right hip: Secondary | ICD-10-CM | POA: Diagnosis not present

## 2021-02-23 LAB — COMPREHENSIVE METABOLIC PANEL
ALT: 22 U/L (ref 0–44)
AST: 25 U/L (ref 15–41)
Albumin: 3.6 g/dL (ref 3.5–5.0)
Alkaline Phosphatase: 73 U/L (ref 38–126)
Anion gap: 8 (ref 5–15)
BUN: 20 mg/dL (ref 8–23)
CO2: 29 mmol/L (ref 22–32)
Calcium: 9.3 mg/dL (ref 8.9–10.3)
Chloride: 95 mmol/L — ABNORMAL LOW (ref 98–111)
Creatinine, Ser: 1.17 mg/dL (ref 0.61–1.24)
GFR, Estimated: >60 mL/min (ref >60)
Glucose, Bld: 151 mg/dL — ABNORMAL HIGH (ref 70–99)
Potassium: 4.9 mmol/L (ref 3.5–5.1)
Sodium: 132 mmol/L — ABNORMAL LOW (ref 135–145)
Total Bilirubin: 0.8 mg/dL (ref 0.3–1.2)
Total Protein: 6.5 g/dL (ref 6.5–8.1)

## 2021-02-23 LAB — SARS CORONAVIRUS 2 (TAT 6-24 HRS): SARS Coronavirus 2: NEGATIVE

## 2021-02-23 MED ORDER — ALUM & MAG HYDROXIDE-SIMETH 200-200-20 MG/5ML PO SUSP
15.0000 mL | ORAL | Status: DC | PRN
  Administered 2021-02-23: 15 mL via ORAL
  Filled 2021-02-23: qty 30

## 2021-02-23 MED ORDER — ENOXAPARIN SODIUM 60 MG/0.6ML IJ SOSY
48.0000 mg | PREFILLED_SYRINGE | INTRAMUSCULAR | Status: DC
  Administered 2021-02-23: 47.5 mg via SUBCUTANEOUS
  Filled 2021-02-23: qty 0.6

## 2021-02-23 MED ORDER — FLUTICASONE FUROATE-VILANTEROL 100-25 MCG/INH IN AEPB
1.0000 | INHALATION_SPRAY | Freq: Every day | RESPIRATORY_TRACT | Status: DC
  Administered 2021-02-23 – 2021-02-24 (×2): 1 via RESPIRATORY_TRACT
  Filled 2021-02-23: qty 28

## 2021-02-23 MED ORDER — ENOXAPARIN SODIUM 40 MG/0.4ML IJ SOSY: 40.0000 mg | PREFILLED_SYRINGE | INTRAMUSCULAR | Status: DC

## 2021-02-23 MED ORDER — HYDRALAZINE HCL 25 MG PO TABS
25.0000 mg | ORAL_TABLET | Freq: Once | ORAL | Status: AC
  Administered 2021-02-23: 25 mg via ORAL
  Filled 2021-02-23: qty 1

## 2021-02-23 NOTE — Progress Notes (Signed)
PHARMACIST - PHYSICIAN COMMUNICATION  CONCERNING:  Enoxaparin (Lovenox) for DVT Prophylaxis    RECOMMENDATION: Patient was prescribed enoxaprin 40mg  q24 hours for VTE prophylaxis.   Filed Weights   02/22/21 1009  Weight: 96 kg (211 lb 10.3 oz)    Body mass index is 33.15 kg/m.  Estimated Creatinine Clearance: 59.3 mL/min (by C-G formula based on SCr of 1.17 mg/dL).   Based on [facility] policy patient is candidate for enoxaparin 0.5mg /kg TBW SQ every 24 hours based on BMI being >30.  DESCRIPTION: Pharmacy has adjusted enoxaparin dose per [facility] policy.  Patient is now receiving enoxaparin 48 mg every 24 hours    Tavion Senkbeil Rodriguez-Guzman PharmD, BCPS 02/23/2021 3:49 PM

## 2021-02-23 NOTE — Progress Notes (Addendum)
PROGRESS NOTE    AMOUS [name]  ZJI:[id number] DOB: January 24, 1946 DOA: 02/22/2021 PCP: Levi Sato, MD   Assessment & Plan:   Principal Problem:   Trochanteric bursitis of right hip Active Problems:   HTN (hypertension)   COPD, severe (HCC)   Greater trochanteric bursitis of right hip   Trochanteric bursitis of right hip: s/p marcaine injected into right great trochanter bursa under direct ultrasound guidance. Continue on prednisone. Robaxin prn   HTN: continue on home dose of amlodipine  HLD: continue on statin   COPD: w/o exacerbation. Continue w/ bronchodilators   Hyponatremia: likely prerenal. Will continue to monitor   DVT prophylaxis:  lovenox Code Status: full Family Communication:  Disposition Plan: d/c back home  Level of care: Med-Surg   Status is: Observation  The patient remains OBS appropriate and will d/c before 2 midnights.  Dispo: The patient is from: Home              Anticipated d/c is to: Home              Patient currently is not medically stable to d/c.   Difficult to place patient unclear   Consultants:    Procedures:   Antimicrobials:   Subjective: Pt still c/o hip pain slightly improved from day prior  Objective: Vitals:   02/22/21 1013 02/22/21 1554 02/22/21 1951 02/23/21 0548  BP: 135/68 (!) 157/72 130/71 (!) 146/78  Pulse: (!) 52 (!) 59 60 (!) 57  Resp: 18 (!) 26 16 16   Temp: 98 F (36.7 C)  98.5 F (36.9 C) 97.6 F (36.4 C)  TempSrc: Oral     SpO2: 96% 91% (!) 88% 97%  Weight:      Height:       No intake or output data in the 24 hours ending 02/23/21 0734 Filed Weights   02/22/21 1009  Weight: 96 kg    Examination:  General exam: Appears calm and comfortable  Respiratory system: Clear to auscultation. Respiratory effort normal. Cardiovascular system: S1 & S2 +. No rubs, gallops or clicks.  Gastrointestinal system: Abdomen is obese, soft and nontender. Normal bowel sounds heard. Central nervous system: Alert  and oriented. Moves all extremities Psychiatry: Judgement and insight appear normal. Mood & affect appropriate.     Data Reviewed: I have personally reviewed following labs and imaging studies  CBC: Recent Labs  Lab 02/22/21 1654  WBC 10.0  NEUTROABS 8.0*  HGB 14.7  HCT 41.7  MCV 90.3  PLT 186   Basic Metabolic Panel: Recent Labs  Lab 02/22/21 1654 02/23/21 0540  NA 135 132*  K 3.9 4.9  CL 101 95*  CO2 27 29  GLUCOSE 90 151*  BUN 13 20  CREATININE 0.91 1.17  CALCIUM 7.9* 9.3   GFR: Estimated Creatinine Clearance: 59.3 mL/min (by C-G formula based on SCr of 1.17 mg/dL). Liver Function Tests: Recent Labs  Lab 02/22/21 1654 02/23/21 0540  AST 25 25  ALT 21 22  ALKPHOS 70 73  BILITOT 0.9 0.8  PROT 5.8* 6.5  ALBUMIN 3.3* 3.6   No results for input(s): LIPASE, AMYLASE in the last 168 hours. No results for input(s): AMMONIA in the last 168 hours. Coagulation Profile: No results for input(s): INR, PROTIME in the last 168 hours. Cardiac Enzymes: No results for input(s): CKTOTAL, CKMB, CKMBINDEX, TROPONINI in the last 168 hours. BNP (last 3 results) No results for input(s): PROBNP in the last 8760 hours. HbA1C: No results for input(s): HGBA1C in the  last 72 hours. CBG: No results for input(s): GLUCAP in the last 168 hours. Lipid Profile: No results for input(s): CHOL, HDL, LDLCALC, TRIG, CHOLHDL, LDLDIRECT in the last 72 hours. Thyroid Function Tests: No results for input(s): TSH, T4TOTAL, FREET4, T3FREE, THYROIDAB in the last 72 hours. Anemia Panel: No results for input(s): VITAMINB12, FOLATE, FERRITIN, TIBC, IRON, RETICCTPCT in the last 72 hours. Sepsis Labs: No results for input(s): PROCALCITON, LATICACIDVEN in the last 168 hours.  Recent Results (from the past 240 hour(s))  SARS CORONAVIRUS 2 (TAT 6-24 HRS) Nasopharyngeal Nasopharyngeal Swab     Status: None   Collection Time: 02/22/21  4:54 PM   Specimen: Nasopharyngeal Swab  Result Value Ref Range  Status   SARS Coronavirus 2 NEGATIVE NEGATIVE Final    Comment: (NOTE) SARS-CoV-2 target nucleic acids are NOT DETECTED.  The SARS-CoV-2 RNA is generally detectable in upper and lower respiratory specimens during the acute phase of infection. Negative results do not preclude SARS-CoV-2 infection, do not rule out co-infections with other pathogens, and should not be used as the sole basis for treatment or other patient management decisions. Negative results must be combined with clinical observations, patient history, and epidemiological information. The expected result is Negative.  Fact Sheet for Patients: HairSlick.no  Fact Sheet for Healthcare Providers: quierodirigir.com  This test is not yet approved or cleared by the [place] FDA and  has been authorized for detection and/or diagnosis of SARS-CoV-2 by FDA under an Emergency Use Authorization (EUA). This EUA will remain  in effect (meaning this test can be used) for the duration of the COVID-19 declaration under Se ction 564(b)(1) of the Act, 21 U.S.C. section 360bbb-3(b)(1), unless the authorization is terminated or revoked sooner.  Performed at [facility], [address] [place], [place] [address]          Radiology Studies: DG Lumbar Spine 2-3 Views  Result Date: 02/22/2021 CLINICAL DATA:  75 year old male with a history of sharp right hip pain EXAM: LUMBAR SPINE - 2-3 VIEW COMPARISON:  None. FINDINGS: Lumbar Spine: Lumbar vertebral elements relatively aligned. Trace anterolisthesis of L4 on L5. No acute fracture line identified. Vertebral body heights maintained. Disc space is mildly narrowed with endplate changes at all lumbar levels. Vacuum disc phenomenon at L5-S1 without significant height loss. Facet hypertrophy most pronounced at L5-S1 and less so at L4-L5. Calcifications of the abdominal aorta and iliac arteries, with saccular aneurysm better  demonstrated on prior CT. IMPRESSION: Negative for acute fracture or malalignment of the lumbar spine. 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 Note that it does not appear the patient has undergone interval repair of his previously identified 7.7 cm abdominal aortic aneurysm, on CT dated 01/03/2021. Aortic Atherosclerosis (ICD10-I70.0). Electronically Signed   By: Gilmer Mor D.O.   On: 02/22/2021 13:08   CT Hip Right Wo Contrast  Result Date: 02/22/2021 CLINICAL DATA:  Hip pain, stress fracture suspected, neg xray. No history of trauma EXAM: CT OF THE RIGHT HIP WITHOUT CONTRAST TECHNIQUE: Multidetector CT imaging of the right hip was performed according to the standard protocol. Multiplanar CT image reconstructions were also generated. COMPARISON:  X-ray 02/22/2021 FINDINGS: Bones/Joint/Cartilage Right hip is intact without fracture or dislocation. The visualized portion of the right hemipelvis is intact without evidence of fracture or diastasis. Mild arthropathy of the right hip joint. 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 No hip joint effusion is seen. No lytic or sclerotic bony lesion.  Ligaments Suboptimally assessed by CT. Muscles and Tendons No acute musculotendinous injury by CT. Soft tissues No soft tissue edema or fluid collection. No right inguinal lymphadenopathy. Atherosclerotic vascular calcifications are present. IMPRESSION: 1. No acute osseous abnormality of the right hip. 2. Mild arthropathy of the right hip joint. Electronically Signed   By: Duanne Guess D.O.   On: 02/22/2021 15:15   DG Hip Unilat W or Wo Pelvis 2-3 Views Right  Result Date: 02/22/2021 CLINICAL DATA:  Lambert Mody right hip pain, no history of trauma. EXAM: DG HIP (WITH OR WITHOUT PELVIS) 2-3V RIGHT COMPARISON:  CT abdomen pelvis dated 01/03/2021. FINDINGS: There is no evidence of hip fracture or dislocation. Mild degenerative changes are seen in both hips.  IMPRESSION: Mild degenerative changes. Electronically Signed   By: Romona Curls M.D.   On: 02/22/2021 13:12        Scheduled Meds:  amLODipine  2.5 mg Oral Daily   aspirin EC  81 mg Oral Daily   atorvastatin  40 mg Oral Daily   famotidine  20 mg Oral BID   metoprolol tartrate  25 mg Oral BID   naproxen  375 mg Oral BID WC   pneumococcal 23 valent vaccine  0.5 mL Intramuscular Tomorrow-1000   predniSONE  40 mg Oral Q breakfast   Continuous Infusions:  methocarbamol (ROBAXIN) IV 500 mg (02/23/21 0041)     LOS: 0 days    Time spent: 33 mins     Charise Killian, MD [facility] Pager [phone]  If 7PM-7AM, please contact night-coverage 02/23/2021, 7:34 AM

## 2021-02-23 NOTE — Evaluation (Signed)
Physical Therapy Evaluation Patient Details Name: [name] MRN: [id number] DOB: [birth year] Today's Date: 02/23/2021   History of Present Illness  75 yo Male came to ED on 02/22/21 with acute onset RLE Lateral hip pain. He reports his hip has been hurting for last 2 weeks and now is unable to stand/walk on RLE. CT (-) for acute fracture. X-rays of lumbar spine reveals L5-S1 disc disease but otherwise unremarkable. PMH significant for COPD, HTN, CAD; He was diagnosed with right hip bursitis and given steroid injection and prednisone. Patient also given O2 to improve resting SPo2 levels. He lives at home with his wife and is her primary caregiver.  Clinical Impression  Patient admitted with RLE hip bursitis. He reports the pain he experienced yesterday is the worst pain he has ever felt and as a result was hesitant to participate in activity today. He is s/p steroid injection and reports his hip does feel better than yesterday. Pain monitored throughout PT evaluation. Patient is doing better and was mod I for bed mobility. He was able to transfer with RW with close supervision requiring min VCs for proper hand placement and postural awareness. Patient ambulated 50 feet in room with RW with reciprocal gait pattern. He does guard and unweight using RW during right stance. Patient reports pain increases with heel off/PF on RLE. He does exhibit mild weakness in RLE which is likely related to pain. He was instructed in LE and core strengthening exercise to help with pain control. He does live at home with his wife who he helps care for. Per patient, he primarily does the cooking/cleaning but does not need to physically assist his wife. He reports he has friends/family who can help when he firsts gets home. Recommend outpatient PT upon discharge to further help reduce pain/inflammation.     Follow Up Recommendations Outpatient PT    Equipment Recommendations  None recommended by PT    Recommendations for  Other Services       Precautions / Restrictions Precautions Precautions: None Restrictions Weight Bearing Restrictions: No      Mobility  Bed Mobility Overal bed mobility: Modified Independent             General bed mobility comments: requires increased time/effort    Transfers Overall transfer level: Needs assistance Equipment used: Rolling walker (2 wheeled) Transfers: Sit to/from Stand Sit to Stand: Supervision         General transfer comment: requires increased time/effort and min VCs for hand placement;  Ambulation/Gait Ambulation/Gait assistance: Min guard Gait Distance (Feet): 50 Feet Assistive device: Rolling walker (2 wheeled) Gait Pattern/deviations: Step-through pattern;Decreased step length - right;Decreased step length - left;Trunk flexed;Decreased stance time - right Gait velocity: decreased   General Gait Details: slower gait speed; reports increased pain with heel off on RLE(during PF); requires increased BUE weight bearing/bracing when weight shifting to RLE;  Careers information officer    Modified Rankin (Stroke Patients Only)       Balance Overall balance assessment: Needs assistance Sitting-balance support: Single extremity supported;Feet supported Sitting balance-Leahy Scale: Good     Standing balance support: Bilateral upper extremity supported;During functional activity Standing balance-Leahy Scale: Fair Standing balance comment: requires RW and min guard to close supervision for safety with ambulation;                             Pertinent Vitals/Pain  Pain Assessment: 0-10 Pain Score: 6  Pain Location: right hip/thigh Pain Descriptors / Indicators: Aching;Sore Pain Intervention(s): Limited activity within patient's tolerance;Monitored during session;Repositioned    Home Living Family/patient expects to be discharged to:: Private residence Living Arrangements: Spouse/significant  other Available Help at Discharge: Family;Friend(s);Available PRN/intermittently Type of Home: House Home Access: Stairs to enter Entrance Stairs-Rails: None Entrance Stairs-Number of Steps: 3 Home Layout: Two level Home Equipment: Walker - 2 wheels;Cane - single point;Crutches      Prior Function Level of Independence: Independent         Comments: primary caregiver for wife. He did all of the cooking/cleaning;     Hand Dominance   Dominant Hand: Right    Extremity/Trunk Assessment   Upper Extremity Assessment Upper Extremity Assessment: Overall WFL for tasks assessed    Lower Extremity Assessment Lower Extremity Assessment: RLE deficits/detail;LLE deficits/detail RLE Deficits / Details: hip strength: 3+/5, knee strength 4/5, ankle strength 4/5 RLE Sensation: WNL RLE Coordination: WNL LLE Deficits / Details: strength grosslly 5/5 LLE Sensation: WNL LLE Coordination: WNL    Cervical / Trunk Assessment Cervical / Trunk Assessment: Kyphotic  Communication   Communication: HOH  Cognition Arousal/Alertness: Awake/alert Behavior During Therapy: WFL for tasks assessed/performed Overall Cognitive Status: Within Functional Limits for tasks assessed                                 General Comments: oriented x4      General Comments      Exercises Other Exercises Other Exercises: Instructed patient in exercise to help with flexibilty and pain: seated: LAQ with ankle DF x10 reps each LE, posterior pelvic rock 3 sec hold x8 reps, gluteal squeeze 3 sec hold x10 reps; Other Exercises: Patient required min VCs for proper positioning and increased technique including to avoid holding breath with posterior pelvic tilt and to increase DF with LAQ for neural stretch; Other Exercises: denies any increase in pain with advanced exercise;   Assessment/Plan    PT Assessment Patient needs continued PT services  PT Problem List Decreased strength;Decreased  mobility;Decreased activity tolerance;Cardiopulmonary status limiting activity;Decreased balance;Pain       PT Treatment Interventions DME instruction;Therapeutic exercise;Gait training;Balance training;Stair training;Neuromuscular re-education;Functional mobility training;Therapeutic activities;Patient/family education    PT Goals (Current goals can be found in the Care Plan section)  Acute Rehab PT Goals Patient Stated Goal: to go home. PT Goal Formulation: With patient Time For Goal Achievement: 03/09/21 Potential to Achieve Goals: Good    Frequency Min 2X/week   Barriers to discharge Inaccessible home environment has stairs; but has help from son and neighbors as needed;    Co-evaluation               AM-PAC PT "6 Clicks" Mobility  Outcome Measure Help needed turning from your back to your side while in a flat bed without using bedrails?: None Help needed moving from lying on your back to sitting on the side of a flat bed without using bedrails?: None Help needed moving to and from a bed to a chair (including a wheelchair)?: A Little Help needed standing up from a chair using your arms (e.g., wheelchair or bedside chair)?: A Little Help needed to walk in hospital room?: A Little Help needed climbing 3-5 steps with a railing? : A Little 6 Click Score: 20    End of Session Equipment Utilized During Treatment: Gait belt Activity Tolerance: Patient tolerated treatment well;No  increased pain Patient left: in chair;with call bell/phone within reach;with chair alarm set Nurse Communication: Mobility status PT Visit Diagnosis: Muscle weakness (generalized) (M62.81);Pain Pain - Right/Left: Right Pain - part of body: Hip    Time: 0488-8916 PT Time Calculation (min) (ACUTE ONLY): 40 min   Charges:   PT Evaluation $PT Eval Low Complexity: 1 Low PT Treatments $Therapeutic Exercise: 8-22 mins          Jaheem Hedgepath PT, DPT 02/23/2021, 2:00 PM

## 2021-02-24 DIAGNOSIS — E871 Hypo-osmolality and hyponatremia: Secondary | ICD-10-CM

## 2021-02-24 DIAGNOSIS — I10 Essential (primary) hypertension: Secondary | ICD-10-CM | POA: Diagnosis not present

## 2021-02-24 DIAGNOSIS — M7061 Trochanteric bursitis, right hip: Secondary | ICD-10-CM | POA: Diagnosis not present

## 2021-02-24 LAB — CBC
HCT: 42.9 % (ref 39.0–52.0)
Hemoglobin: 14.7 g/dL (ref 13.0–17.0)
MCH: 30.9 pg (ref 26.0–34.0)
MCHC: 34.3 g/dL (ref 30.0–36.0)
MCV: 90.3 fL (ref 80.0–100.0)
Platelets: 210 10*3/uL (ref 150–400)
RBC: 4.75 MIL/uL (ref 4.22–5.81)
RDW: 11.9 % (ref 11.5–15.5)
WBC: 17.3 10*3/uL — ABNORMAL HIGH (ref 4.0–10.5)
nRBC: 0 % (ref 0.0–0.2)

## 2021-02-24 LAB — BASIC METABOLIC PANEL
Anion gap: 7 (ref 5–15)
BUN: 23 mg/dL (ref 8–23)
CO2: 30 mmol/L (ref 22–32)
Calcium: 9 mg/dL (ref 8.9–10.3)
Chloride: 96 mmol/L — ABNORMAL LOW (ref 98–111)
Creatinine, Ser: 0.89 mg/dL (ref 0.61–1.24)
GFR, Estimated: >60 mL/min (ref >60)
Glucose, Bld: 116 mg/dL — ABNORMAL HIGH (ref 70–99)
Potassium: 4.8 mmol/L (ref 3.5–5.1)
Sodium: 133 mmol/L — ABNORMAL LOW (ref 135–145)

## 2021-02-24 MED ORDER — PREDNISONE 20 MG PO TABS: 40.0000 mg | ORAL_TABLET | Freq: Every day | ORAL | 0 refills | Status: AC

## 2021-02-24 MED ORDER — METHOCARBAMOL 500 MG PO TABS: 500.0000 mg | ORAL_TABLET | Freq: Two times a day (BID) | ORAL | 0 refills | Status: AC | PRN

## 2021-02-24 MED ORDER — OXYCODONE-ACETAMINOPHEN 5-325 MG PO TABS: 1.0000 | ORAL_TABLET | Freq: Four times a day (QID) | ORAL | 0 refills | Status: AC | PRN

## 2021-02-24 NOTE — Discharge Summary (Signed)
Physician Discharge Summary  BAYLEY HURN RUE:[id number] DOB: Jan 15, 1946 DOA: 02/22/2021  PCP: Leanna Sato, MD  Admit date: 02/22/2021 Discharge date: 02/24/2021  Admitted From: home  Disposition:  home   Recommendations for Outpatient Follow-up:  Follow up with PCP in 1-2 weeks F/u w/ ortho surg prn   Home Health: no  Equipment/Devices:  Discharge Condition: stable  CODE STATUS: full  Diet recommendation: Heart Healthy    Brief/Interim Summary: HPI was taken from Dr. Imogene Burn: 75 yo WM with hx of COPD, HTN, CAD, presents to ER with 2-3 weeks of gradually worsening right hip pain. Pain located on right greater trochanter. Occ pain in right groin with radiation to right medial thigh. Progressive worsening of his inability to walk over the last several days. Today, pt unable to even stand without severe pain. Pt can to ER for evaluation. Labs, CT right hip unremarkable. Pt still unable to stand and refusing to walk. [facility] consulted for admission due to right hip pain and inability to walk. 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     ED Course: CT right hip negative for fracture. Pt given multiple rounds of IV opiates with only partial relief of his right hip pain.  Hospital course from Dr. Mayford Knife 8/14-15/22: Pt presented w/ right hip pain likely secondary to trochanteric bursitis. Pt is s/p marcaine injection intro the right great trochanter bursa which improved the pt's right hip pain. Pt was also treated w/ steroids, muscle relaxants and pain meds prn. PT evaluated pt and recommended outpatient PT.   Discharge Diagnoses:  Principal Problem:   Trochanteric bursitis of right hip Active Problems:   HTN (hypertension)   COPD, severe (HCC)   Greater trochanteric bursitis of right hip  Trochanteric bursitis of right hip: s/p marcaine injected into right great trochanter bursa under direct ultrasound guidance. Continue on prednisone. Robaxin & percocet prn   HTN:  continue on home dose of amlodipine  HLD: continue on statin   COPD: w/o exacerbation. Continue w/ bronchodilators   Hyponatremia: likely prerenal, trending up today.  Discharge Instructions  Discharge Instructions     Diet - low sodium heart healthy   Complete by: As directed    Discharge instructions   Complete by: As directed    F/u w/ PCP in 1-2 weeks. F/u w/ ortho surg as needed   Increase activity slowly   Complete by: As directed       Allergies as of 02/24/2021   No Known Allergies      Medication List     TAKE these medications    albuterol 108 (90 Base) MCG/ACT inhaler Commonly known as: VENTOLIN HFA Inhale 1-2 puffs into the lungs every 6 (six) hours as needed for wheezing or shortness of breath.   amLODipine 2.5 MG tablet Commonly known as: NORVASC Take 1 tablet (2.5 mg total) by mouth daily.   aspirin EC 325 MG tablet Take 325 mg by mouth daily.   atorvastatin 40 MG tablet Commonly known as: LIPITOR Take 40 mg by mouth daily.   ibuprofen 200 MG tablet Commonly known as: ADVIL Take 400-600 mg by mouth every 6 (six) hours as needed for mild pain or moderate pain.   methocarbamol 500 MG tablet Commonly known as: Robaxin Take 1 tablet (500 mg total) by mouth 2 (two) times daily as needed for up to 7 days for muscle spasms.   metoprolol succinate 25 MG 24 hr tablet Commonly known as: TOPROL-XL Take 25  mg by mouth daily.   oxyCODONE-acetaminophen 5-325 MG tablet Commonly known as: PERCOCET/ROXICET Take 1 tablet by mouth every 6 (six) hours as needed for up to 5 days for moderate pain or severe pain.   predniSONE 20 MG tablet Commonly known as: DELTASONE Take 2 tablets (40 mg total) by mouth daily with breakfast for 5 days. Start taking on: February 25, 2021   quinapril-hydrochlorothiazide 10-12.5 MG tablet Commonly known as: ACCURETIC Take 1 tablet by mouth daily.   Trelegy Ellipta 100-62.5-25 MCG/INH Aepb Generic drug:  Fluticasone-Umeclidin-Vilant Inhale 1 puff into the lungs daily.        No Known Allergies  Consultations:    Procedures/Studies: DG Lumbar Spine 2-3 Views  Result Date: 02/22/2021 CLINICAL DATA:  75 year old male with a history of sharp right hip pain EXAM: LUMBAR SPINE - 2-3 VIEW COMPARISON:  None. FINDINGS: Lumbar Spine: Lumbar vertebral elements relatively aligned. Trace anterolisthesis of L4 on L5. No acute fracture line identified. Vertebral body heights maintained. Disc space is mildly narrowed with endplate changes at all lumbar levels. Vacuum disc phenomenon at L5-S1 without significant height loss. Facet hypertrophy most pronounced at L5-S1 and less so at L4-L5. Calcifications of the abdominal aorta and iliac arteries, with saccular aneurysm better demonstrated on prior CT. IMPRESSION: Negative for acute fracture or malalignment of the lumbar spine. 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 Note that it does not appear the patient has undergone interval repair of his previously identified 7.7 cm abdominal aortic aneurysm, on CT dated 01/03/2021. Aortic Atherosclerosis (ICD10-I70.0). Electronically Signed   By: Gilmer Mor D.O.   On: 02/22/2021 13:08   CT Hip Right Wo Contrast  Result Date: 02/22/2021 CLINICAL DATA:  Hip pain, stress fracture suspected, neg xray. No history of trauma EXAM: CT OF THE RIGHT HIP WITHOUT CONTRAST TECHNIQUE: Multidetector CT imaging of the right hip was performed according to the standard protocol. Multiplanar CT image reconstructions were also generated. COMPARISON:  X-ray 02/22/2021 FINDINGS: Bones/Joint/Cartilage Right hip is intact without fracture or dislocation. The visualized portion of the right hemipelvis is intact without evidence of fracture or diastasis. Mild arthropathy of the right hip joint. 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 No hip joint effusion is seen. No lytic or  sclerotic bony lesion. Ligaments Suboptimally assessed by CT. Muscles and Tendons No acute musculotendinous injury by CT. Soft tissues No soft tissue edema or fluid collection. No right inguinal lymphadenopathy. Atherosclerotic vascular calcifications are present. IMPRESSION: 1. No acute osseous abnormality of the right hip. 2. Mild arthropathy of the right hip joint. Electronically Signed   By: Duanne Guess D.O.   On: 02/22/2021 15:15   DG Hip Unilat W or Wo Pelvis 2-3 Views Right  Result Date: 02/22/2021 CLINICAL DATA:  Lambert Mody right hip pain, no history of trauma. EXAM: DG HIP (WITH OR WITHOUT PELVIS) 2-3V RIGHT COMPARISON:  CT abdomen pelvis dated 01/03/2021. FINDINGS: There is no evidence of hip fracture or dislocation. Mild degenerative changes are seen in both hips. IMPRESSION: Mild degenerative changes. Electronically Signed   By: Romona Curls M.D.   On: 02/22/2021 13:12   (Echo, Carotid, EGD, Colonoscopy, ERCP)    Subjective: Pt c/o fatigue    Discharge Exam: Vitals:   02/24/21 0036 02/24/21 0519  BP: (!) 168/83 (!) 156/75  Pulse: 62 60  Resp:  17  Temp:  97.7 F (36.5 C)  SpO2: 100% 94%   Vitals:   02/23/21 2026 02/23/21 2217 02/24/21  0036 02/24/21 0519  BP: (!) 142/81 (!) 161/76 (!) 168/83 (!) 156/75  Pulse: (!) 53 (!) 56 62 60  Resp: 17   17  Temp: 98 F (36.7 C)   97.7 F (36.5 C)  TempSrc:      SpO2: 98% 98% 100% 94%  Weight:      Height:        General: Pt is alert, awake, not in acute distress Cardiovascular: S1/S2 +, no rubs, no gallops Respiratory: CTA bilaterally, no wheezing, no rhonchi Abdominal: Soft, NT, ND, bowel sounds + Extremities: no cyanosis    The results of significant diagnostics from this hospitalization (including imaging, microbiology, ancillary and laboratory) are listed below for reference.     Microbiology: Recent Results (from the past 240 hour(s))  SARS CORONAVIRUS 2 (TAT 6-24 HRS) Nasopharyngeal Nasopharyngeal Swab      Status: None   Collection Time: 02/22/21  4:54 PM   Specimen: Nasopharyngeal Swab  Result Value Ref Range Status   SARS Coronavirus 2 NEGATIVE NEGATIVE Final    Comment: (NOTE) SARS-CoV-2 target nucleic acids are NOT DETECTED.  The SARS-CoV-2 RNA is generally detectable in upper and lower respiratory specimens during the acute phase of infection. Negative results do not preclude SARS-CoV-2 infection, do not rule out co-infections with other pathogens, and should not be used as the sole basis for treatment or other patient management decisions. Negative results must be combined with clinical observations, patient history, and epidemiological information. The expected result is Negative.  Fact Sheet for Patients: HairSlick.nohttps://www.fda.gov/media/138098/download  Fact Sheet for Healthcare Providers: quierodirigir.comhttps://www.fda.gov/media/138095/download  This test is not yet approved or cleared by the [place] FDA and  has been authorized for detection and/or diagnosis of SARS-CoV-2 by FDA under an Emergency Use Authorization (EUA). This EUA will remain  in effect (meaning this test can be used) for the duration of the COVID-19 declaration under Se ction 564(b)(1) of the Act, 21 U.S.C. section 360bbb-3(b)(1), unless the authorization is terminated or revoked sooner.  Performed at [facility], [address] [place], [place] [address]      Labs: BNP (last 3 results) No results for input(s): BNP in the last 8760 hours. Basic Metabolic Panel: Recent Labs  Lab 02/22/21 1654 02/23/21 0540 02/24/21 0621  NA 135 132* 133*  K 3.9 4.9 4.8  CL 101 95* 96*  CO2 27 29 30   GLUCOSE 90 151* 116*  BUN 13 20 23   CREATININE 0.91 1.17 0.89  CALCIUM 7.9* 9.3 9.0   Liver Function Tests: Recent Labs  Lab 02/22/21 1654 02/23/21 0540  AST 25 25  ALT 21 22  ALKPHOS 70 73  BILITOT 0.9 0.8  PROT 5.8* 6.5  ALBUMIN 3.3* 3.6   No results for input(s): LIPASE, AMYLASE in the last 168  hours. No results for input(s): AMMONIA in the last 168 hours. CBC: Recent Labs  Lab 02/22/21 1654 02/24/21 0621  WBC 10.0 17.3*  NEUTROABS 8.0*  --   HGB 14.7 14.7  HCT 41.7 42.9  MCV 90.3 90.3  PLT 186 210   Cardiac Enzymes: No results for input(s): CKTOTAL, CKMB, CKMBINDEX, TROPONINI in the last 168 hours. BNP: Invalid input(s): POCBNP CBG: No results for input(s): GLUCAP in the last 168 hours. D-Dimer No results for input(s): DDIMER in the last 72 hours. Hgb A1c No results for input(s): HGBA1C in the last 72 hours. Lipid Profile No results for input(s): CHOL, HDL, LDLCALC, TRIG, CHOLHDL, LDLDIRECT in the last 72 hours. Thyroid function studies  No results for input(s): TSH, T4TOTAL, T3FREE, THYROIDAB in the last 72 hours.  Invalid input(s): FREET3 Anemia work up No results for input(s): VITAMINB12, FOLATE, FERRITIN, TIBC, IRON, RETICCTPCT in the last 72 hours. Urinalysis    Component Value Date/Time   COLORURINE YELLOW 10/19/2012 2323   APPEARANCEUR CLEAR 10/19/2012 2323   LABSPEC 1.012 10/19/2012 2323   PHURINE 6.5 10/19/2012 2323   GLUCOSEU NEGATIVE 10/19/2012 2323   HGBUR NEGATIVE 10/19/2012 2323   BILIRUBINUR NEGATIVE 10/19/2012 2323   KETONESUR NEGATIVE 10/19/2012 2323   PROTEINUR NEGATIVE 10/19/2012 2323   UROBILINOGEN 1.0 10/19/2012 2323   NITRITE NEGATIVE 10/19/2012 2323   LEUKOCYTESUR NEGATIVE 10/19/2012 2323   Sepsis Labs Invalid input(s): PROCALCITONIN,  WBC,  LACTICIDVEN Microbiology Recent Results (from the past 240 hour(s))  SARS CORONAVIRUS 2 (TAT 6-24 HRS) Nasopharyngeal Nasopharyngeal Swab     Status: None   Collection Time: 02/22/21  4:54 PM   Specimen: Nasopharyngeal Swab  Result Value Ref Range Status   SARS Coronavirus 2 NEGATIVE NEGATIVE Final    Comment: (NOTE) SARS-CoV-2 target nucleic acids are NOT DETECTED.  The SARS-CoV-2 RNA is generally detectable in upper and lower respiratory specimens during the acute phase of infection.  Negative results do not preclude SARS-CoV-2 infection, do not rule out co-infections with other pathogens, and should not be used as the sole basis for treatment or other patient management decisions. Negative results must be combined with clinical observations, patient history, and epidemiological information. The expected result is Negative.  Fact Sheet for Patients: HairSlick.no  Fact Sheet for Healthcare Providers: quierodirigir.com  This test is not yet approved or cleared by the [place] FDA and  has been authorized for detection and/or diagnosis of SARS-CoV-2 by FDA under an Emergency Use Authorization (EUA). This EUA will remain  in effect (meaning this test can be used) for the duration of the COVID-19 declaration under Se ction 564(b)(1) of the Act, 21 U.S.C. section 360bbb-3(b)(1), unless the authorization is terminated or revoked sooner.  Performed at [facility], [address] [place], [place] [address]      Time coordinating discharge: Over 30 minutes  SIGNED:   Charise Killian, MD  [facility] 02/24/2021, 11:48 AM Pager   If 7PM-7AM, please contact night-coverage

## 2021-02-24 NOTE — Progress Notes (Signed)
PT Cancellation Note  Patient Details Name: [name] MRN: [id number] DOB: Jun 09, 1946   Cancelled Treatment:    Reason Eval/Treat Not Completed: PT screened, no needs identified, will sign off. Chart reviewed. PT arrived to pt room, pt reported he needed to use the restroom. PT ambulated pt to restroom, pt Mod I using RW. RN notified pt was on toilet. PT returned to room at later time to discuss navigation of stairs. Pt reports he has been off-weighting his RLE "for a while" due to knee pain. He described stair navigation to off-weight RLE with 100% accuracy. Pt has no PT needs.    Basilia Jumbo PT, DPT 02/24/21 12:10 PM [phone]

## 2021-02-24 NOTE — Progress Notes (Signed)
Pt discharged to home.  IV removed without complication.  AVS given to pt and explained with no further questions.  All belongings at bedside taken with pt.  Pt transported off unit via WC  

## 2021-03-07 ENCOUNTER — Encounter (INDEPENDENT_AMBULATORY_CARE_PROVIDER_SITE_OTHER): Payer: Self-pay

## 2021-03-18 ENCOUNTER — Other Ambulatory Visit (INDEPENDENT_AMBULATORY_CARE_PROVIDER_SITE_OTHER): Payer: Self-pay | Admitting: Nurse Practitioner

## 2021-03-18 ENCOUNTER — Other Ambulatory Visit
Admission: RE | Admit: 2021-03-18 | Discharge: 2021-03-18 | Disposition: A | Discharge status: Home / Self Care | Payer: Medicare Other | Source: Ambulatory Visit | Attending: Vascular Surgery | Admitting: Vascular Surgery

## 2021-03-18 ENCOUNTER — Other Ambulatory Visit: Payer: Self-pay

## 2021-03-18 HISTORY — DX: Acute myocardial infarction, unspecified: I21.9

## 2021-03-18 NOTE — Patient Instructions (Signed)
Your procedure is scheduled on: Wednesday March 26, 2021. Report to Day [facility] inside [facility] 2nd floor stop by admissions desk first before getting on elevator. To find out your arrival time please call [phone] between 1PM - 3PM on  Tuesday March 25, 2021.  Remember: Instructions that are not followed completely may result in serious medical risk,  up to and including death, or upon the discretion of your surgeon and anesthesiologist your  surgery may need to be rescheduled.     _X__ 1. Do not eat food or drink fluids after midnight the night before your procedure.                 No chewing gum or hard candies.                  anything to coffee or tea).  __X__2.  On the morning of surgery brush your teeth with toothpaste and water, you                may rinse your mouth with mouthwash if you wish.  Do not swallow any toothpaste of mouthwash.     _X__ 3.  No Alcohol for 24 hours before or after surgery.   _X__ 4.  Do Not Smoke or use e-cigarettes For 24 Hours Prior to Your Surgery.                 Do not use any chewable tobacco products for at least 6 hours prior to                 Surgery.  _X__  5.  Do not use any recreational drugs (marijuana, cocaine, heroin, ecstasy, MDMA or other)                For at least one week prior to your surgery.  Combination of these drugs with anesthesia                May have life threatening results.  __X__ 6.  Notify your doctor if there is any change in your medical condition      (cold, fever, infections).     Do not wear jewelry, make-up, hairpins, clips or nail polish. Do not wear lotions, powders, or perfumes. You may wear deodorant. Do not shave 48 hours prior to surgery. Men may shave face and neck. Do not bring valuables to the hospital.    [facility] is not responsible for any belongings or valuables.  Contacts, dentures or bridgework may not be worn into surgery. Leave your suitcase  in the car. After surgery it may be brought to your room. For patients admitted to the hospital, discharge time is determined by your treatment team.   Patients discharged the day of surgery will not be allowed to drive home.   Make arrangements for someone to be with you for the first 24 hours of your Same Day Discharge.   __X__ Take these medicines the morning of surgery with A SIP OF WATER:    1. amLODipine (NORVASC) 2.5 MG   2. metoprolol succinate (TOPROL-XL) 25 MG   3.   4.  5.  6.  ____ Fleet Enema (as directed)   __X__ Use CHG Soap (or wipes) as directed  ____ Use Benzoyl Peroxide Gel as instructed  __X__ Use inhalers on the day of surgery  Fluticasone-Umeclidin-Vilant (TRELEGY ELLIPTA) 100-62.5-25 MCG/INH AEPB ____ Stop metformin 2 days prior to surgery    ____ Take 1/2  of usual insulin dose the night before surgery. No insulin the morning          of surgery.   ____ Call your PCP, cardiologist, or Pulmonologist if taking Coumadin/Plavix/aspirin and ask when to stop before your surgery.   __X__ One Week prior to surgery- Stop Anti-inflammatories such as Ibuprofen, Aleve, Advil, Motrin, meloxicam (MOBIC), diclofenac, etodolac, ketorolac, Toradol, Daypro, piroxicam, Goody's or BC powders. OK TO USE TYLENOL IF NEEDED   __X__ Do not start any herbal supplements before your procedure surgery.    ____ Bring C-Pap to the hospital.    If you have any questions regarding your pre-procedure instructions,  Please call Pre-admit Testing at [phone].

## 2021-03-20 ENCOUNTER — Encounter: Payer: Self-pay | Admitting: Vascular Surgery

## 2021-03-20 NOTE — Progress Notes (Signed)
Perioperative Services  Pre-Admission/Anesthesia Testing Clinical Review  Date: 03/24/21  Patient Demographics:  Name: [name] DOB:   03/07/1946 MRN:   [id number]  Planned Surgical Procedure(s):    Case: [id number] Date/Time: 03/26/21 1030   Procedure: ENDOVASCULAR REPAIR/STENT GRAFT   Anesthesia type: General   Diagnosis: AAA (abdominal aortic aneurysm) without rupture ([name]) [I71.4]   Pre-op diagnosis: Endovascular AAA Repair   GORE  ANESTHESIA   AAA   Location: [facility]   Providers: [name] Dolores Lory, MD     NOTE: Available PAT nursing documentation and vital signs have been reviewed. Clinical nursing staff has updated patient's PMH/PSHx, current medication list, and drug allergies/intolerances to ensure comprehensive history available to assist in medical decision making as it pertains to the aforementioned surgical procedure and anticipated anesthetic course. Extensive review of available clinical information performed. [name] PMH and PSHx updated with any diagnoses/procedures that  may have been inadvertently omitted during his intake with the pre-admission testing department's nursing staff.  Clinical Discussion:  [name] is a 75 y.o. male who is submitted for pre-surgical anesthesia review and clearance prior to him undergoing the above procedure. Patient is a Former Smoker (quit 10/2012). Pertinent PMH includes: CAD (s/p CABG), MI, infrarenal abdominal aortic aneurysm, aortic atherosclerosis, IRBBB, HTN, HLD, COPD.  Patient is followed by cardiology Levi Pickett, MD). He was last seen in the cardiology clinic on 02/11/2021 notes reviewed.  At the time of his clinic visit, patient doing well overall from a cardiovascular perspective.  He denied any episodes of chest pain, shortness breath, PND, orthopnea, palpitations, significant peripheral edema, vertiginous symptoms, or presyncope/syncope.  PMH significant for cardiovascular diagnoses.  Patient  reported to have experienced an MI in the past; date unknown at time of review.   Patient underwent diagnostic left heart catheterization for evaluation of unstable angina back on 03/26/2003  Study at that time revealed a chronically acute occluded RCA with collaterals from left to right.  There was 60% stenosis of the mid LCx, 40% stenosis of OM, and 50% stenosis of the LAD.  Intervention was deferred opting for medical management.  Repeat cardiac catheterization was performed on 10/19/2012 revealing severe three-vessel CAD.  Left ventricular function was preserved.  Given the complexity and degree of disease, CVTS was consulted for consideration of CABG procedure.  Patient underwent three-vessel CABG procedure on 10/20/2012.  LIMA-LAD, SVG-OM, and SVG-PDA bypass grafts were placed.  On 12/11/2020, patient underwent repeat CT imaging of the chest without contrast to evaluate a previously identified pulmonary nodule.  Incomplete imaging of AAA was identified.  CTA was recommended.  CTA performed on 01/03/2021 revealing a 7 x 7 cm LEFT infrarenal abdominal aortic aneurysm that appeared to be saccular in composition.  Additionally there were multiple areas of arterial atherosclerotic disease, including the celiac artery, SMA, bilateral renal arteries, bilateral iliac arteries, and bilateral common femoral arteries.  Of note, the stenosis of the renal arteries is worse on the LEFT secondary to both atherosclerotic plaque and mass-effect from the superior aspect of the aneurysmal sac.  Blood pressure well controlled on currently prescribed CCB, beta-blocker, and ACEi/diuretic therapies.  Patient is on a statin for his HLD.  He is not diabetic.  Functional capacity, as defined by DASI, is documented as being >/= 4 METS.  No changes were made to his medication regimen.  Patient to follow-up with outpatient cardiology in 6 months or sooner if needed.  [name] is scheduled for an EVAR  on 03/26/2021 with Dr.  Hortencia Pilar, MD.  Given patient's past medical history significant for cardiovascular diagnoses, presurgical cardiac clearance was sought by the performing surgeon's office and PAT team.  Per cardiology, "RCRI places patient at a 6.6% risk of MACE.  His functional capacity is 6.05 METS according to the DASI.  Patient is deemed an ACCEPTABLE risk for the planned procedure without additional cardiovascular testing".  This patient is on daily antiplatelet therapy.  Per Dr. Delana [name] standing orders, this patient may continue his daily ASA dose throughout the perioperative period.    Patient reports previous perioperative complications with anesthesia in the past.  Patient advising that "he thinks" that he has experienced (+) postoperative breathing difficulties in the past.  In review of the available records, it is noted that patient underwent a MAC anesthetic course at [facility] (ASA IV) in 09/2018 without documented complications.   Vitals with BMI 03/18/2021 02/24/2021 02/24/2021  Height _0  - -  Weight 200 lbs - -  BMI 03.75 - -  Systolic - 436 067  Diastolic - 63 75  Pulse - 55 60    Providers/Specialists:   NOTE: Primary physician provider listed below. Patient may have been seen by APP or partner within same practice.   PROVIDER ROLE / SPECIALTY LAST Anne Hahn, Dolores Lory, MD Vascular Surgery 01/16/2021  Marguerita Merles, MD Primary Care Provider ???  Lyman Bishop, MD Cardiology 02/11/2021  Ottie Glazier, MD Pulmonary Medicine 11/12/2020   Allergies:  Patient has no known allergies.  Current Home Medications:   No current facility-administered medications for this encounter.    albuterol (VENTOLIN HFA) 108 (90 Base) MCG/ACT inhaler   amLODipine (NORVASC) 2.5 MG tablet   aspirin EC 325 MG tablet   aspirin EC 81 MG tablet   atorvastatin (LIPITOR) 40 MG tablet   Cholecalciferol (VITAMIN D) 125 MCG (5000 UT) CAPS   Fluticasone-Umeclidin-Vilant (TRELEGY ELLIPTA)  100-62.5-25 MCG/INH AEPB   ibuprofen (ADVIL) 200 MG tablet   metoprolol succinate (TOPROL-XL) 25 MG 24 hr tablet   quinapril-hydrochlorothiazide (ACCURETIC) 10-12.5 MG tablet   History:   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   a.) saccular configuration; measured 7.7 cm on CTA done on 01/03/2021   Aortic atherosclerosis (HCC)    Complication of anesthesia    patient thinks he has some breathing difficulties after anesthesia   COPD, 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   Coronary artery disease    Dyslipidemia 10/18/2012   History of kidney stones    Hypertension    Incomplete right bundle branch block (RBBB)    Myocardial infarction [facility])    PAD (peripheral artery disease) ([place])    a.) CTA on 01/03/2021 --> mild BILATERAL CFA stenosis; hypogastric artery narrowed at origin; BILATERAL iliac arteries with mild-moderate atherosclerosis (no high grade stenosis); 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 b.) punctate subpleural nodule in anterior LUL; new on 12/11/20 CT.   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 Roxy Manns)   Varicose veins    Past Surgical History:  Procedure Laterality Date   CARDIAC CATHETERIZATION  03/26/2003   occluded RCA with collaterals from L to R, 60% mid Cfx disease,  40% branch disease to OM, LAD with 50% stenosis (Dr. Gerrie Nordmann)    CATARACT EXTRACTION W/PHACO Right 08/19/2018   Procedure: CATARACT EXTRACTION PHACO AND INTRAOCULAR LENS PLACEMENT ([place]);  Surgeon: Baruch Goldmann, MD;  Location: [facility];  Service: Ophthalmology;  Laterality: Right;  CDE: 9.14   CATARACT EXTRACTION W/PHACO Left 09/27/2018   Procedure: CATARACT EXTRACTION PHACO AND INTRAOCULAR LENS PLACEMENT LEFT  EYE  (CDE: 5.11);  Surgeon: Baruch Goldmann, MD;  Location: [facility];  Service: Ophthalmology;  Laterality: Left;   CHOLECYSTECTOMY     COLONOSCOPY  2017   CORONARY ARTERY BYPASS GRAFT N/A 10/20/2012   Procedure: CORONARY ARTERY BYPASS GRAFTING (CABG);  Surgeon: Rexene Alberts, MD;  Location: [place];  Service: Open Heart Surgery;  Laterality: N/A;   INTRAOPERATIVE TRANSESOPHAGEAL ECHOCARDIOGRAM N/A 10/20/2012   Procedure: INTRAOPERATIVE TRANSESOPHAGEAL ECHOCARDIOGRAM;  Surgeon: Rexene Alberts, MD;  Location: [place];  Service: Open Heart Surgery;  Laterality: N/A;   LEFT HEART CATHETERIZATION WITH CORONARY ANGIOGRAM N/A 10/19/2012   Procedure: LEFT HEART CATHETERIZATION WITH CORONARY ANGIOGRAM;  Surgeon: Pixie Casino, MD;  Location: [facility] CATH LAB;  Service: Cardiovascular;  Laterality: N/A;   SHOULDER SURGERY Right 2002   Family History  Problem Relation Age of Onset   Coronary artery disease Mother    Social History   Tobacco Use   Smoking status: Former    Packs/day: 2.00    Types: Cigarettes    Quit date: 10/11/2012    Years since quitting: 8.4   Smokeless tobacco: Never  Vaping Use   Vaping Use: Never used  Substance Use Topics   Alcohol use: No   Drug use: No    Pertinent Clinical Results:  LABS:  Hospital Outpatient Visit on 03/24/2021  Component Date Value Ref Range Status   WBC 03/24/2021 10.6 (A) 4.0 - 10.5 K/uL Final   RBC 03/24/2021 4.66  4.22 - 5.81 MIL/uL Final   Hemoglobin 03/24/2021 14.9  13.0 - 17.0 g/dL Final   HCT 03/24/2021 41.0  39.0 - 52.0 % Final   MCV 03/24/2021 88.0  80.0 - 100.0 fL Final   MCH 03/24/2021 32.0  26.0 - 34.0 pg Final   MCHC 03/24/2021 36.3 (A) 30.0 - 36.0 g/dL Final   RDW 03/24/2021 11.8  11.5 - 15.5 % Final   Platelets 03/24/2021 277  150 - 400 K/uL Final   nRBC 03/24/2021 0.0  0.0 - 0.2 % Final   Neutrophils Relative % 03/24/2021 76  % Final   Neutro Abs 03/24/2021 8.0 (A) 1.7 - 7.7 K/uL Final   Lymphocytes Relative 03/24/2021 12  %  Final   Lymphs Abs 03/24/2021 1.2  0.7 - 4.0 K/uL Final   Monocytes Relative 03/24/2021 11  % Final   Monocytes Absolute 03/24/2021 1.2 (A) 0.1 - 1.0 K/uL Final   Eosinophils Relative 03/24/2021 1  % Final   Eosinophils Absolute 03/24/2021 0.1  0.0 - 0.5 K/uL Final   Basophils Relative 03/24/2021 0  % Final   Basophils Absolute 03/24/2021 0.0  0.0 - 0.1 K/uL Final   Immature Granulocytes 03/24/2021 0  % Final   Abs Immature Granulocytes 03/24/2021 0.04  0.00 - 0.07 K/uL Final   Performed at [facility], [address] [place], [place] [address]   Sodium 03/24/2021 126 (A) 135 - 145 mmol/L Final   Potassium 03/24/2021 3.2 (A) 3.5 - 5.1 mmol/L Final   Chloride 03/24/2021 89 (A) 98 - 111 mmol/L Final   CO2 03/24/2021 28  22 -  32 mmol/L Final   Glucose, Bld 03/24/2021 91  70 - 99 mg/dL Final   Glucose reference range applies only to samples taken after fasting for at least 8 hours.   BUN 03/24/2021 16  8 - 23 mg/dL Final   Creatinine, Ser 03/24/2021 1.00  0.61 - 1.24 mg/dL Final   Calcium 03/24/2021 9.0  8.9 - 10.3 mg/dL Final   GFR, Estimated 03/24/2021 >60  >60 mL/min Final   Comment: (NOTE) Calculated using the CKD-EPI Creatinine Equation (2021)    Anion gap 03/24/2021 9  5 - 15 Final   Performed at [facility], [place], [place] [address]    ECG: Date: 02/11/2021 Time ECG obtained: 1317 PM Rate: 52 bpm Rhythm: sinus bradycardia; IRBBB Axis (leads I and aVF): Right axis deviation Intervals: PR 196 ms. QRS 104 ms. QTc 386 ms. ST segment and T wave changes: No evidence of acute ST segment elevation or depression Comparison: Similar to previous tracing obtained on 03/08/2020   IMAGING / PROCEDURES: CTA ABDOMEN PELVIS WITH/WITHOUT CONTRAST performed on 01/03/2021 Infrarenal abdominal aortic aneurysm with saccular configuration, measuring 7.7 cm from the left infrarenal abdominal aorta, likely related to a prior ulcerated plaque.  Bilateral  renal arterial disease, worst on the left. On the left the narrowing is likely related to a combination of both atherosclerotic plaque as well as mass effect from the superior aspect of the aneurysm sac. Of note there is a small accessory left renal artery arising below the IMA to the lower pole cortex. Bilateral mild to moderate iliac arterial disease without high-grade stenosis or occlusion. Celiac artery patent with mild atherosclerosis. No significant stenosis. Branch vessels are patent. SMA patent with mild atherosclerosis.  Branch vessels are patent. Moderate tortuosity of the BILATERAL iliac arterial systems. Mild to moderate atherosclerotic changes without high-grade stenosis or occlusion. Hypogastric artery is patent with calcified plaque at the origin BILATERAL common femoral arteries patent with mild atherosclerosis. High bifurcation of the BILATERAL renal arteries. Proximal profunda  femoris and SFA patent. Calcified right hilar lymph nodes. No acute finding of the lung bases. Nonobstructing stone in the hilum of the right kidney measuring 7 mm. No perinephric fluid or inflammatory changes. Unremarkable course of the right ureter. No focal lesion. Nonenhancing 19 mm lesion at the superior cortex of the left kidney likely a complex cyst. 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 No significant bony canal narrowing  CT CHEST WITHOUT CONTRAST performed on 12/11/2020 Stable 6 mm right middle lobe pulmonary nodule from 2015, considered benign. No dedicated imaging follow-up of this nodule is needed. New punctate subpleural nodule in the anterior left upper lobe. Moderate emphysema. Consider yearly lung cancer screening CT if patient is eligible. Alternatively, 1 year follow-up of this new nodule is recommended. Aortic atherosclerosis and coronary artery calcifications, post CABG. Mild dilatation of the main pulmonary artery can  be seen with pulmonary arterial hypertension. Partially included in the upper abdomen is a peripherally calcified left retroperitoneal structure measuring at least 5.6 x 4.7 cm, not entirely included in the field of view, abutting the abdominal aorta. Differential considerations include saccular aneurysm or adenopathy. This was not previously included in the field of view. In the absence of prior abdominal imaging for characterization, recommend abdominal CTA or MRA for further assessment Incidental right renal calculus.  CORONARY ARTERY BYPASS GRAFTING performed on 10/20/2012 Three-vessel CABG procedure LIMA-LAD SVG-OM SVG-PDA  Impression and Plan:  Danni Leabo  [place] has been referred for pre-anesthesia review and clearance prior to him undergoing the planned anesthetic and procedural courses. Available labs, pertinent testing, and imaging results were personally reviewed by me. Primary surgeon's office made aware of lab abnormalities noted on pre-operative lab testing today. This patient has been appropriately cleared by cardiology with an overall ACCEPTABLE risk of significant perioperative cardiovascular complications.  Based on clinical review performed today (03/24/21), barring any significant acute changes in the patient's overall condition, it is anticipated that he will be able to proceed with the planned surgical intervention. Any acute changes in clinical condition may necessitate his procedure being postponed and/or cancelled. Patient will meet with anesthesia team (MD and/or CRNA) on the day of his procedure for preoperative evaluation/assessment. Questions regarding anesthetic course will be fielded at that time.   Pre-surgical instructions were reviewed with the patient during his PAT appointment and questions were fielded by PAT clinical staff. Patient was advised that if any questions or concerns arise prior to his procedure then he should return a call to PAT and/or his surgeon's office  to discuss.  Honor Loh, MSN, APRN, FNP-C, CEN [facility]  Peri-operative Services Nurse Practitioner Phone: [phone] Fax: [phone] 03/24/21 12:32 PM  NOTE: This note has been prepared using Dragon dictation software. Despite my best ability to proofread, there is always the potential that unintentional transcriptional errors may still occur from this process.

## 2021-03-21 ENCOUNTER — Encounter: Payer: Self-pay | Admitting: Vascular Surgery

## 2021-03-24 ENCOUNTER — Other Ambulatory Visit: Payer: Medicare Other

## 2021-03-24 ENCOUNTER — Other Ambulatory Visit: Payer: Self-pay

## 2021-03-24 ENCOUNTER — Encounter
Admission: RE | Admit: 2021-03-24 | Discharge: 2021-03-24 | Disposition: A | Discharge status: Home / Self Care | Payer: Medicare Other | Source: Ambulatory Visit | Attending: Vascular Surgery | Admitting: Vascular Surgery

## 2021-03-24 DIAGNOSIS — Z01812 Encounter for preprocedural laboratory examination: Secondary | ICD-10-CM | POA: Insufficient documentation

## 2021-03-24 DIAGNOSIS — Z20822 Contact with and (suspected) exposure to covid-19: Secondary | ICD-10-CM | POA: Insufficient documentation

## 2021-03-24 LAB — CBC WITH DIFFERENTIAL/PLATELET
Abs Immature Granulocytes: 0.04 10*3/uL (ref 0.00–0.07)
Basophils Absolute: 0 10*3/uL (ref 0.0–0.1)
Basophils Relative: 0 %
Eosinophils Absolute: 0.1 10*3/uL (ref 0.0–0.5)
Eosinophils Relative: 1 %
HCT: 41 % (ref 39.0–52.0)
Hemoglobin: 14.9 g/dL (ref 13.0–17.0)
Immature Granulocytes: 0 %
Lymphocytes Relative: 12 %
Lymphs Abs: 1.2 10*3/uL (ref 0.7–4.0)
MCH: 32 pg (ref 26.0–34.0)
MCHC: 36.3 g/dL — ABNORMAL HIGH (ref 30.0–36.0)
MCV: 88 fL (ref 80.0–100.0)
Monocytes Absolute: 1.2 10*3/uL — ABNORMAL HIGH (ref 0.1–1.0)
Monocytes Relative: 11 %
Neutro Abs: 8 10*3/uL — ABNORMAL HIGH (ref 1.7–7.7)
Neutrophils Relative %: 76 %
Platelets: 277 10*3/uL (ref 150–400)
RBC: 4.66 MIL/uL (ref 4.22–5.81)
RDW: 11.8 % (ref 11.5–15.5)
WBC: 10.6 10*3/uL — ABNORMAL HIGH (ref 4.0–10.5)
nRBC: 0 % (ref 0.0–0.2)

## 2021-03-24 LAB — BASIC METABOLIC PANEL
Anion gap: 9 (ref 5–15)
BUN: 16 mg/dL (ref 8–23)
CO2: 28 mmol/L (ref 22–32)
Calcium: 9 mg/dL (ref 8.9–10.3)
Chloride: 89 mmol/L — ABNORMAL LOW (ref 98–111)
Creatinine, Ser: 1 mg/dL (ref 0.61–1.24)
GFR, Estimated: >60 mL/min (ref >60)
Glucose, Bld: 91 mg/dL (ref 70–99)
Potassium: 3.2 mmol/L — ABNORMAL LOW (ref 3.5–5.1)
Sodium: 126 mmol/L — ABNORMAL LOW (ref 135–145)

## 2021-03-24 LAB — SARS CORONAVIRUS 2 (TAT 6-24 HRS): SARS Coronavirus 2: NEGATIVE

## 2021-03-24 NOTE — Progress Notes (Signed)
  [facility] Perioperative Services: Pre-Admission/Anesthesia Testing  Abnormal Lab Notification   Date: 03/24/21  Name: TREAVON CASTILLEJA MRN:   [id number]  Re: Abnormal labs noted during PAT appointment   Provider(s) Notified: Sheppard Plumber, NP-C Notification mode: Routed and/or faxed via [facility]   ABNORMAL LAB VALUE(S): Lab Results  Component Value Date   NA 126 (L) 03/24/2021   K 3.2 (L) 03/24/2021   CL 89 (L) 03/24/2021   Notes:  Patient is scheduled for a ENDOVASCULAR REPAIR/STENT GRAFT on 03/26/2021. Patient is on daily diuretic therapy.   This is a Personal assistant; no formal response is required.  Quentin Mulling, MSN, APRN, FNP-C, CEN [facility]  Peri-operative Services Nurse Practitioner Phone: [phone] Fax: [phone] 03/24/21 12:27 PM

## 2021-03-25 LAB — TYPE AND SCREEN
ABO/RH(D): A POS
Antibody Screen: NEGATIVE

## 2021-03-25 NOTE — Progress Notes (Signed)
Patient called wanting to know what time he needed to arrive tomorrow for his procedure.  Informed patient his arrival time is 9:30 for 10:30 start of his procedure

## 2021-03-26 ENCOUNTER — Inpatient Hospital Stay
Admission: RE | Admit: 2021-03-26 | Discharge: 2021-03-27 | DRG: 269 | Disposition: A | Discharge status: Home / Self Care | Payer: Medicare Other | Attending: Vascular Surgery | Admitting: Vascular Surgery

## 2021-03-26 ENCOUNTER — Other Ambulatory Visit: Payer: Self-pay

## 2021-03-26 ENCOUNTER — Inpatient Hospital Stay: Payer: Medicare Other | Admitting: Urgent Care

## 2021-03-26 ENCOUNTER — Encounter
Admission: RE | Disposition: A | Discharge status: Home / Self Care | Payer: Self-pay | Source: Home / Self Care | Attending: Vascular Surgery

## 2021-03-26 ENCOUNTER — Encounter: Payer: Self-pay | Admitting: Vascular Surgery

## 2021-03-26 DIAGNOSIS — I700 Atherosclerosis of aorta: Secondary | ICD-10-CM | POA: Diagnosis present

## 2021-03-26 DIAGNOSIS — Z87891 Personal history of nicotine dependence: Secondary | ICD-10-CM

## 2021-03-26 DIAGNOSIS — I708 Atherosclerosis of other arteries: Secondary | ICD-10-CM | POA: Diagnosis present

## 2021-03-26 DIAGNOSIS — Z961 Presence of intraocular lens: Secondary | ICD-10-CM | POA: Diagnosis present

## 2021-03-26 DIAGNOSIS — E669 Obesity, unspecified: Secondary | ICD-10-CM | POA: Diagnosis present

## 2021-03-26 DIAGNOSIS — Z9841 Cataract extraction status, right eye: Secondary | ICD-10-CM

## 2021-03-26 DIAGNOSIS — Z7982 Long term (current) use of aspirin: Secondary | ICD-10-CM | POA: Diagnosis not present

## 2021-03-26 DIAGNOSIS — Z8673 Personal history of transient ischemic attack (TIA), and cerebral infarction without residual deficits: Secondary | ICD-10-CM | POA: Diagnosis not present

## 2021-03-26 DIAGNOSIS — I714 Abdominal aortic aneurysm, without rupture, unspecified: Secondary | ICD-10-CM | POA: Diagnosis present

## 2021-03-26 DIAGNOSIS — I70203 Unspecified atherosclerosis of native arteries of extremities, bilateral legs: Secondary | ICD-10-CM | POA: Diagnosis present

## 2021-03-26 DIAGNOSIS — Z6831 Body mass index (BMI) 31.0-31.9, adult: Secondary | ICD-10-CM | POA: Diagnosis not present

## 2021-03-26 DIAGNOSIS — Z87442 Personal history of urinary calculi: Secondary | ICD-10-CM | POA: Diagnosis not present

## 2021-03-26 DIAGNOSIS — I252 Old myocardial infarction: Secondary | ICD-10-CM

## 2021-03-26 DIAGNOSIS — Z9842 Cataract extraction status, left eye: Secondary | ICD-10-CM

## 2021-03-26 DIAGNOSIS — I701 Atherosclerosis of renal artery: Secondary | ICD-10-CM | POA: Diagnosis present

## 2021-03-26 DIAGNOSIS — Z20822 Contact with and (suspected) exposure to covid-19: Secondary | ICD-10-CM | POA: Diagnosis present

## 2021-03-26 DIAGNOSIS — Z8249 Family history of ischemic heart disease and other diseases of the circulatory system: Secondary | ICD-10-CM | POA: Diagnosis not present

## 2021-03-26 DIAGNOSIS — J449 Chronic obstructive pulmonary disease, unspecified: Secondary | ICD-10-CM | POA: Diagnosis present

## 2021-03-26 DIAGNOSIS — Z9049 Acquired absence of other specified parts of digestive tract: Secondary | ICD-10-CM

## 2021-03-26 DIAGNOSIS — Z951 Presence of aortocoronary bypass graft: Secondary | ICD-10-CM | POA: Diagnosis not present

## 2021-03-26 DIAGNOSIS — Z79899 Other long term (current) drug therapy: Secondary | ICD-10-CM

## 2021-03-26 DIAGNOSIS — I2510 Atherosclerotic heart disease of native coronary artery without angina pectoris: Secondary | ICD-10-CM | POA: Diagnosis present

## 2021-03-26 DIAGNOSIS — I10 Essential (primary) hypertension: Secondary | ICD-10-CM | POA: Diagnosis present

## 2021-03-26 DIAGNOSIS — I4510 Unspecified right bundle-branch block: Secondary | ICD-10-CM | POA: Diagnosis present

## 2021-03-26 DIAGNOSIS — E782 Mixed hyperlipidemia: Secondary | ICD-10-CM | POA: Diagnosis present

## 2021-03-26 DIAGNOSIS — I2583 Coronary atherosclerosis due to lipid rich plaque: Secondary | ICD-10-CM | POA: Diagnosis present

## 2021-03-26 HISTORY — PX: ENDOVASCULAR REPAIR/STENT GRAFT: CATH118280

## 2021-03-26 HISTORY — DX: Unspecified right bundle-branch block: I45.10

## 2021-03-26 HISTORY — DX: Solitary pulmonary nodule: R91.1

## 2021-03-26 HISTORY — DX: Peripheral vascular disease, unspecified: I73.9

## 2021-03-26 HISTORY — DX: Atherosclerosis of aorta: I70.0

## 2021-03-26 LAB — MRSA NEXT GEN BY PCR, NASAL: MRSA by PCR Next Gen: NOT DETECTED

## 2021-03-26 LAB — GLUCOSE, CAPILLARY: Glucose-Capillary: 118 mg/dL — ABNORMAL HIGH (ref 70–99)

## 2021-03-26 SURGERY — ENDOVASCULAR STENT GRAFT (AAA)
Anesthesia: General

## 2021-03-26 MED ORDER — GUAIFENESIN-DM 100-10 MG/5ML PO SYRP: 15.0000 mL | ORAL_SOLUTION | ORAL | Status: DC | PRN

## 2021-03-26 MED ORDER — DOPAMINE-DEXTROSE 3.2-5 MG/ML-% IV SOLN: 3.0000 ug/kg/min | INTRAVENOUS | Status: DC

## 2021-03-26 MED ORDER — LIDOCAINE HCL (PF) 2 % IJ SOLN
INTRAMUSCULAR | Status: AC
  Filled 2021-03-26: qty 5

## 2021-03-26 MED ORDER — IODIXANOL 320 MG/ML IV SOLN
INTRAVENOUS | Status: DC | PRN
  Administered 2021-03-26: 50 mL via INTRA_ARTERIAL

## 2021-03-26 MED ORDER — PHENYLEPHRINE HCL (PRESSORS) 10 MG/ML IV SOLN
INTRAVENOUS | Status: DC | PRN
  Administered 2021-03-26 (×2): 100 ug via INTRAVENOUS

## 2021-03-26 MED ORDER — SODIUM CHLORIDE 0.9 % IV SOLN: 500.0000 mL | Freq: Once | INTRAVENOUS | Status: DC | PRN

## 2021-03-26 MED ORDER — DEXMEDETOMIDINE (PRECEDEX) IN NS 20 MCG/5ML (4 MCG/ML) IV SYRINGE
PREFILLED_SYRINGE | INTRAVENOUS | Status: DC | PRN
  Administered 2021-03-26: 6 ug via INTRAVENOUS
  Administered 2021-03-26: 4 ug via INTRAVENOUS

## 2021-03-26 MED ORDER — CEFAZOLIN SODIUM-DEXTROSE 2-4 GM/100ML-% IV SOLN
2.0000 g | Freq: Three times a day (TID) | INTRAVENOUS | Status: AC
  Administered 2021-03-26 – 2021-03-27 (×2): 2 g via INTRAVENOUS
  Filled 2021-03-26 (×2): qty 100

## 2021-03-26 MED ORDER — CHLORHEXIDINE GLUCONATE CLOTH 2 % EX PADS: 6.0000 | MEDICATED_PAD | Freq: Once | CUTANEOUS | Status: DC

## 2021-03-26 MED ORDER — CHLORHEXIDINE GLUCONATE 0.12 % MT SOLN
15.0000 mL | Freq: Once | OROMUCOSAL | Status: AC
  Administered 2021-03-26: 15 mL via OROMUCOSAL
  Filled 2021-03-26: qty 15

## 2021-03-26 MED ORDER — ROCURONIUM BROMIDE 10 MG/ML (PF) SYRINGE
PREFILLED_SYRINGE | INTRAVENOUS | Status: AC
  Filled 2021-03-26: qty 10

## 2021-03-26 MED ORDER — LACTATED RINGERS IV SOLN: INTRAVENOUS | Status: DC | PRN

## 2021-03-26 MED ORDER — ACETAMINOPHEN 325 MG PO TABS
325.0000 mg | ORAL_TABLET | ORAL | Status: DC | PRN
  Administered 2021-03-26 – 2021-03-27 (×2): 650 mg via ORAL
  Filled 2021-03-26 (×2): qty 2

## 2021-03-26 MED ORDER — POTASSIUM CHLORIDE CRYS ER 20 MEQ PO TBCR: 20.0000 meq | EXTENDED_RELEASE_TABLET | Freq: Every day | ORAL | Status: DC | PRN

## 2021-03-26 MED ORDER — DEXAMETHASONE SODIUM PHOSPHATE 10 MG/ML IJ SOLN
INTRAMUSCULAR | Status: DC | PRN
  Administered 2021-03-26: 10 mg via INTRAVENOUS

## 2021-03-26 MED ORDER — CEFAZOLIN SODIUM-DEXTROSE 2-4 GM/100ML-% IV SOLN
INTRAVENOUS | Status: AC
  Filled 2021-03-26: qty 100

## 2021-03-26 MED ORDER — AMLODIPINE BESYLATE 5 MG PO TABS
2.5000 mg | ORAL_TABLET | Freq: Every day | ORAL | Status: DC
  Administered 2021-03-27: 2.5 mg via ORAL
  Filled 2021-03-26: qty 1

## 2021-03-26 MED ORDER — SODIUM CHLORIDE 0.9 % IV SOLN
INTRAVENOUS | Status: DC | PRN
  Administered 2021-03-26: 30 ug/min via INTRAVENOUS

## 2021-03-26 MED ORDER — HYDROCHLOROTHIAZIDE 12.5 MG PO CAPS
12.5000 mg | ORAL_CAPSULE | Freq: Every day | ORAL | Status: DC
  Administered 2021-03-27: 12.5 mg via ORAL
  Filled 2021-03-26 (×2): qty 1

## 2021-03-26 MED ORDER — CHLORHEXIDINE GLUCONATE CLOTH 2 % EX PADS
6.0000 | MEDICATED_PAD | Freq: Once | CUTANEOUS | Status: AC
  Administered 2021-03-26: 6 via TOPICAL

## 2021-03-26 MED ORDER — FENTANYL CITRATE (PF) 100 MCG/2ML IJ SOLN
INTRAMUSCULAR | Status: AC
  Filled 2021-03-26: qty 2

## 2021-03-26 MED ORDER — FENTANYL CITRATE PF 50 MCG/ML IJ SOSY: 25.0000 ug | PREFILLED_SYRINGE | INTRAMUSCULAR | Status: DC | PRN

## 2021-03-26 MED ORDER — MORPHINE SULFATE (PF) 2 MG/ML IV SOLN
2.0000 mg | INTRAVENOUS | Status: DC | PRN
  Administered 2021-03-26: 2 mg via INTRAVENOUS
  Filled 2021-03-26: qty 1

## 2021-03-26 MED ORDER — SUCCINYLCHOLINE CHLORIDE 200 MG/10ML IV SOSY
PREFILLED_SYRINGE | INTRAVENOUS | Status: AC
  Filled 2021-03-26: qty 10

## 2021-03-26 MED ORDER — HYDRALAZINE HCL 20 MG/ML IJ SOLN: 5.0000 mg | INTRAMUSCULAR | Status: DC | PRN

## 2021-03-26 MED ORDER — DEXAMETHASONE SODIUM PHOSPHATE 10 MG/ML IJ SOLN
INTRAMUSCULAR | Status: AC
  Filled 2021-03-26: qty 1

## 2021-03-26 MED ORDER — HYDROCODONE-ACETAMINOPHEN 7.5-325 MG PO TABS: 1.0000 | ORAL_TABLET | Freq: Once | ORAL | Status: DC | PRN

## 2021-03-26 MED ORDER — PROPOFOL 10 MG/ML IV BOLUS
INTRAVENOUS | Status: DC | PRN
  Administered 2021-03-26: 160 mg via INTRAVENOUS

## 2021-03-26 MED ORDER — ACETAMINOPHEN 650 MG RE SUPP: 325.0000 mg | RECTAL | Status: DC | PRN

## 2021-03-26 MED ORDER — FENTANYL CITRATE (PF) 100 MCG/2ML IJ SOLN
INTRAMUSCULAR | Status: DC | PRN
  Administered 2021-03-26: 50 ug via INTRAVENOUS
  Administered 2021-03-26 (×2): 25 ug via INTRAVENOUS

## 2021-03-26 MED ORDER — SODIUM CHLORIDE 0.9 % IV SOLN: INTRAVENOUS | Status: DC

## 2021-03-26 MED ORDER — DOCUSATE SODIUM 100 MG PO CAPS
100.0000 mg | ORAL_CAPSULE | Freq: Every day | ORAL | Status: DC
  Administered 2021-03-27: 100 mg via ORAL
  Filled 2021-03-26: qty 1

## 2021-03-26 MED ORDER — ALBUTEROL SULFATE (2.5 MG/3ML) 0.083% IN NEBU: 3.0000 mL | INHALATION_SOLUTION | Freq: Four times a day (QID) | RESPIRATORY_TRACT | Status: DC | PRN

## 2021-03-26 MED ORDER — METOPROLOL TARTRATE 5 MG/5ML IV SOLN: 2.0000 mg | INTRAVENOUS | Status: DC | PRN

## 2021-03-26 MED ORDER — LIDOCAINE HCL (CARDIAC) PF 100 MG/5ML IV SOSY
PREFILLED_SYRINGE | INTRAVENOUS | Status: DC | PRN
  Administered 2021-03-26: 100 mg via INTRAVENOUS

## 2021-03-26 MED ORDER — FAMOTIDINE 20 MG PO TABS: 20.0000 mg | ORAL_TABLET | Freq: Once | ORAL | Status: DC

## 2021-03-26 MED ORDER — SUGAMMADEX SODIUM 200 MG/2ML IV SOLN
INTRAVENOUS | Status: DC | PRN
  Administered 2021-03-26: 200 mg via INTRAVENOUS

## 2021-03-26 MED ORDER — FLUTICASONE-UMECLIDIN-VILANT 100-62.5-25 MCG/INH IN AEPB: 1.0000 | INHALATION_SPRAY | Freq: Every day | RESPIRATORY_TRACT | Status: DC

## 2021-03-26 MED ORDER — MAGNESIUM SULFATE 2 GM/50ML IV SOLN
2.0000 g | Freq: Every day | INTRAVENOUS | Status: DC | PRN
  Filled 2021-03-26: qty 50

## 2021-03-26 MED ORDER — LISINOPRIL 5 MG PO TABS
10.0000 mg | ORAL_TABLET | Freq: Every day | ORAL | Status: DC
  Administered 2021-03-27: 10 mg via ORAL
  Filled 2021-03-26: qty 2

## 2021-03-26 MED ORDER — NITROGLYCERIN IN D5W 200-5 MCG/ML-% IV SOLN: 5.0000 ug/min | INTRAVENOUS | Status: DC

## 2021-03-26 MED ORDER — ROCURONIUM BROMIDE 100 MG/10ML IV SOLN
INTRAVENOUS | Status: DC | PRN
  Administered 2021-03-26: 50 mg via INTRAVENOUS
  Administered 2021-03-26: 30 mg via INTRAVENOUS
  Administered 2021-03-26: 20 mg via INTRAVENOUS

## 2021-03-26 MED ORDER — ONDANSETRON HCL 4 MG/2ML IJ SOLN
INTRAMUSCULAR | Status: DC | PRN
  Administered 2021-03-26: 4 mg via INTRAVENOUS

## 2021-03-26 MED ORDER — CLOPIDOGREL BISULFATE 75 MG PO TABS
75.0000 mg | ORAL_TABLET | Freq: Every day | ORAL | Status: DC
  Administered 2021-03-27: 75 mg via ORAL
  Filled 2021-03-26: qty 1

## 2021-03-26 MED ORDER — SODIUM CHLORIDE 0.9 % IV SOLN: INTRAVENOUS | Status: DC | PRN

## 2021-03-26 MED ORDER — PHENOL 1.4 % MT LIQD
1.0000 | OROMUCOSAL | Status: DC | PRN
  Filled 2021-03-26: qty 177

## 2021-03-26 MED ORDER — HEPARIN SODIUM (PORCINE) 1000 UNIT/ML IJ SOLN
INTRAMUSCULAR | Status: DC | PRN
  Administered 2021-03-26: 5000 [IU] via INTRAVENOUS

## 2021-03-26 MED ORDER — QUINAPRIL-HYDROCHLOROTHIAZIDE 10-12.5 MG PO TABS: 1.0000 | ORAL_TABLET | Freq: Every day | ORAL | Status: DC

## 2021-03-26 MED ORDER — FAMOTIDINE 20 MG IN NS 100 ML IVPB
20.0000 mg | Freq: Two times a day (BID) | INTRAVENOUS | Status: DC
  Administered 2021-03-26 (×2): 20 mg via INTRAVENOUS
  Filled 2021-03-26 (×2): qty 100

## 2021-03-26 MED ORDER — PROPOFOL 10 MG/ML IV BOLUS
INTRAVENOUS | Status: AC
  Filled 2021-03-26: qty 20

## 2021-03-26 MED ORDER — CHLORHEXIDINE GLUCONATE CLOTH 2 % EX PADS
6.0000 | MEDICATED_PAD | Freq: Every day | CUTANEOUS | Status: DC
  Administered 2021-03-26: 6 via TOPICAL

## 2021-03-26 MED ORDER — UMECLIDINIUM BROMIDE 62.5 MCG/INH IN AEPB
1.0000 | INHALATION_SPRAY | Freq: Every day | RESPIRATORY_TRACT | Status: DC
  Administered 2021-03-27: 1 via RESPIRATORY_TRACT
  Filled 2021-03-26: qty 7

## 2021-03-26 MED ORDER — METOPROLOL SUCCINATE ER 50 MG PO TB24: 25.0000 mg | ORAL_TABLET | Freq: Every day | ORAL | Status: DC

## 2021-03-26 MED ORDER — FLUTICASONE FUROATE-VILANTEROL 100-25 MCG/INH IN AEPB
1.0000 | INHALATION_SPRAY | Freq: Every day | RESPIRATORY_TRACT | Status: DC
  Administered 2021-03-27: 1 via RESPIRATORY_TRACT
  Filled 2021-03-26: qty 28

## 2021-03-26 MED ORDER — ATORVASTATIN CALCIUM 20 MG PO TABS
40.0000 mg | ORAL_TABLET | Freq: Every day | ORAL | Status: DC
  Administered 2021-03-26 – 2021-03-27 (×2): 40 mg via ORAL
  Filled 2021-03-26 (×2): qty 2

## 2021-03-26 MED ORDER — OXYCODONE-ACETAMINOPHEN 5-325 MG PO TABS
1.0000 | ORAL_TABLET | ORAL | Status: DC | PRN
  Administered 2021-03-26: 2 via ORAL
  Filled 2021-03-26: qty 2

## 2021-03-26 MED ORDER — ONDANSETRON HCL 4 MG/2ML IJ SOLN
INTRAMUSCULAR | Status: AC
  Filled 2021-03-26: qty 2

## 2021-03-26 MED ORDER — ONDANSETRON HCL 4 MG/2ML IJ SOLN: 4.0000 mg | Freq: Four times a day (QID) | INTRAMUSCULAR | Status: DC | PRN

## 2021-03-26 MED ORDER — ORAL CARE MOUTH RINSE: 15.0000 mL | Freq: Once | OROMUCOSAL | Status: AC

## 2021-03-26 MED ORDER — CEFAZOLIN SODIUM-DEXTROSE 2-4 GM/100ML-% IV SOLN
2.0000 g | INTRAVENOUS | Status: AC
  Administered 2021-03-26: 2 g via INTRAVENOUS

## 2021-03-26 MED ORDER — LABETALOL HCL 5 MG/ML IV SOLN: 10.0000 mg | INTRAVENOUS | Status: DC | PRN

## 2021-03-26 MED ORDER — LACTATED RINGERS IV SOLN
INTRAVENOUS | Status: DC
Start: 2021-03-26 — End: 2021-03-26

## 2021-03-26 MED ORDER — HYDROMORPHONE HCL 1 MG/ML IJ SOLN: 1.0000 mg | Freq: Once | INTRAMUSCULAR | Status: DC | PRN

## 2021-03-26 MED ORDER — ALUM & MAG HYDROXIDE-SIMETH 200-200-20 MG/5ML PO SUSP: 15.0000 mL | ORAL | Status: DC | PRN

## 2021-03-26 MED ORDER — ASPIRIN EC 81 MG PO TBEC
81.0000 mg | DELAYED_RELEASE_TABLET | Freq: Every day | ORAL | Status: DC
  Administered 2021-03-26 – 2021-03-27 (×2): 81 mg via ORAL
  Filled 2021-03-26 (×2): qty 1

## 2021-03-26 SURGICAL SUPPLY — 28 items
CANNULA 5F STIFF (CANNULA) ×1 IMPLANT
CATH ACCU-VU SIZ PIG 5F 70CM (CATHETERS) ×1 IMPLANT
CATH BALLN CODA 9X100X32 (BALLOONS) ×1 IMPLANT
CATH BEACON 5 .035 65 KMP TIP (CATHETERS) ×1 IMPLANT
CLOSURE PERCLOSE PROSTYLE (VASCULAR PRODUCTS) ×7 IMPLANT
COVER DRAPE FLUORO 36X44 (DRAPES) ×1 IMPLANT
COVER PROBE U/S 5X48 (MISCELLANEOUS) ×1 IMPLANT
DEVICE SAFEGUARD 24CM (GAUZE/BANDAGES/DRESSINGS) ×2 IMPLANT
DEVICE TORQUE .025-.038 (MISCELLANEOUS) ×1 IMPLANT
DRYSEAL FLEXSHEATH 12FR 33CM (SHEATH) ×1
DRYSEAL FLEXSHEATH 18FR 33CM (SHEATH) ×1
EXCLDR TRNK 28.5X14.5X12 16F (Endovascular Graft) ×2 IMPLANT
EXCLUDER TNK 28.5X14.5X12 16F (Endovascular Graft) IMPLANT
GLIDEWIRE STIFF .35X180X3 HYDR (WIRE) ×1 IMPLANT
GUIDEWIRE SUPER STIFF .035X180 (WIRE) ×1 IMPLANT
LEG CONTRALATERAL 16X18X9.5 (Endovascular Graft) ×1 IMPLANT
LEG CONTRALATERAL 20X11.5 (Vascular Products) ×2 IMPLANT
PACK ANGIOGRAPHY (CUSTOM PROCEDURE TRAY) ×2 IMPLANT
SHEATH BRITE TIP 6FRX11 (SHEATH) ×2 IMPLANT
SHEATH BRITE TIP 8FRX11 (SHEATH) ×2 IMPLANT
SHEATH DRYSEAL FLEX 12FR 33CM (SHEATH) IMPLANT
SHEATH DRYSEAL FLEX 18FR 33CM (SHEATH) IMPLANT
SPONGE XRAY 4X4 16PLY STRL (MISCELLANEOUS) ×3 IMPLANT
STENT GRAFT CONTRALAT 20X11.5 (Vascular Products) IMPLANT
SYR MEDRAD MARK 7 150ML (SYRINGE) ×1 IMPLANT
TUBING CONTRAST HIGH PRESS 72 (TUBING) ×1 IMPLANT
WIRE GUIDERIGHT .035X150 (WIRE) ×2 IMPLANT
WIRE STIFF LUNDERQUIST 260MM (WIRE) ×1 IMPLANT

## 2021-03-26 NOTE — Anesthesia Procedure Notes (Signed)
Procedure Name: Intubation Date/Time: 03/26/2021 11:17 AM Performed by: Alanson Puls, RN Pre-anesthesia Checklist: Patient identified, Patient being monitored, Timeout performed, Emergency Drugs available and Suction available Patient Re-evaluated:Patient Re-evaluated prior to induction Oxygen Delivery Method: Circle system utilized Preoxygenation: Pre-oxygenation with 100% oxygen Induction Type: IV induction Ventilation: Mask ventilation without difficulty Laryngoscope Size: McGraph and 4 Grade View: Grade I Tube type: Oral Tube size: 7.5 mm Number of attempts: 1 Airway Equipment and Method: Stylet Placement Confirmation: ETT inserted through vocal cords under direct vision, positive ETCO2 and breath sounds checked- equal and bilateral Secured at: 22 cm Tube secured with: Tape Dental Injury: Teeth and Oropharynx as per pre-operative assessment

## 2021-03-26 NOTE — H&P (Signed)
@LOGO @   MRN :  [name] is a 75 y.o. (September 11, 1945) male who presents with chief complaint of fix my AAA.  History of Present Illness:   The patient presents to [facility] for repair of his abdominal aortic aneurysm. There were no problems or complications related to the previous CT scan. The patient denies interval development of abdominal or back pain. No new lower extremity pain or discoloration of the toes.    The patient has a history of coronary artery disease, no recent episodes of angina or shortness of breath. The patient denies interval anaurosis fugax. There is o recent history of TIA symptoms or focal motor deficits. The patient denies PAD or claudication symptoms. There is a history of hyperlipidemia which is being treated with a statin.    CT angiography of the abdomen and pelvis shows an infrarenal AAA 7.4 cm.     Current Meds  Medication Sig   albuterol (VENTOLIN HFA) 108 (90 Base) MCG/ACT inhaler Inhale 1-2 puffs into the lungs every 6 (six) hours as needed for wheezing or shortness of breath.   amLODipine (NORVASC) 2.5 MG tablet Take 1 tablet (2.5 mg total) by mouth daily.   aspirin EC 81 MG tablet Take 81 mg by mouth daily. Swallow whole.   atorvastatin (LIPITOR) 40 MG tablet Take 40 mg by mouth daily.   Cholecalciferol (VITAMIN D) 125 MCG (5000 UT) CAPS Take by mouth.   Fluticasone-Umeclidin-Vilant (TRELEGY ELLIPTA) 100-62.5-25 MCG/INH AEPB Inhale 1 puff into the lungs daily.   ibuprofen (ADVIL) 200 MG tablet Take 400-600 mg by mouth every 6 (six) hours as needed for mild pain or moderate pain.   metoprolol succinate (TOPROL-XL) 25 MG 24 hr tablet Take 25 mg by mouth daily.   quinapril-hydrochlorothiazide (ACCURETIC) 10-12.5 MG tablet Take 1 tablet by mouth daily.    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   Coronary artery disease    Dyslipidemia 10/18/2012   History of kidney stones    Hypertension    Incomplete right bundle branch block (RBBB)    Myocardial infarction [facility])    PAD (peripheral artery disease) (HCC)    a.) CTA on 01/03/2021 --> mild BILATERAL CFA stenosis; hypogastric artery narrowed at origin; BILATERAL iliac arteries with mild-moderate atherosclerosis (no high grade stenosis); 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 b.) punctate subpleural nodule in anterior LUL; new on 12/11/20 CT.   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 12/20/2012)   Varicose veins     Past Surgical History:  Procedure Laterality Date   CARDIAC CATHETERIZATION  03/26/2003   occluded RCA with collaterals from L to R, 60% mid Cfx disease, 40% branch disease to OM, LAD with 50% stenosis (Dr. 03/28/2003)    CATARACT EXTRACTION W/PHACO Right 08/19/2018   Procedure: CATARACT EXTRACTION PHACO AND INTRAOCULAR LENS PLACEMENT (IOC);  Surgeon: 10/18/2018, MD;  Location: [facility];  Service: Ophthalmology;  Laterality: Right;  CDE: 9.14   CATARACT EXTRACTION W/PHACO Left 09/27/2018   Procedure: CATARACT EXTRACTION PHACO AND INTRAOCULAR LENS PLACEMENT LEFT EYE  (CDE: 5.11);  Surgeon: Fabio Pierce, MD;  Location: [facility];  Service: Ophthalmology;  Laterality: Left;   CHOLECYSTECTOMY     COLONOSCOPY  2017   CORONARY ARTERY BYPASS GRAFT N/A 10/20/2012   Procedure: CORONARY ARTERY BYPASS GRAFTING (CABG);  Surgeon: Purcell Nails, MD;  Location: [facility] OR;  Service: Open Heart Surgery;  Laterality: N/A;    INTRAOPERATIVE TRANSESOPHAGEAL ECHOCARDIOGRAM N/A 10/20/2012   Procedure: INTRAOPERATIVE TRANSESOPHAGEAL ECHOCARDIOGRAM;  Surgeon: Purcell Nails, MD;  Location: [facility] OR;  Service: Open Heart Surgery;  Laterality: N/A;   LEFT HEART CATHETERIZATION WITH CORONARY ANGIOGRAM N/A 10/19/2012   Procedure: LEFT HEART CATHETERIZATION WITH CORONARY ANGIOGRAM;  Surgeon: Chrystie Nose, MD;  Location: [facility] CATH LAB;  Service: Cardiovascular;  Laterality: N/A;   SHOULDER SURGERY Right 2002    Social History Social History   Tobacco Use   Smoking status: Former    Packs/day: 2.00    Types: Cigarettes    Quit date: 10/11/2012    Years since quitting: 8.4   Smokeless tobacco: Never  Vaping Use   Vaping Use: Never used  Substance Use Topics   Alcohol use: No   Drug use: No    Family History Family History  Problem Relation Age of Onset   Coronary artery disease Mother     No Known Allergies   REVIEW OF SYSTEMS (Negative unless checked)  Constitutional: [] Weight loss  [] Fever  [] Chills Cardiac: [] Chest pain   [] Chest pressure   [] Palpitations   [] Shortness of breath when laying flat   [] Shortness of breath with exertion. Vascular:  [] Pain in legs with walking   [] Pain in legs at rest  [] History of DVT   [] Phlebitis   [] Swelling in legs   [] Varicose veins   [] Non-healing ulcers Pulmonary:   [] Uses home oxygen   [] Productive cough   [] Hemoptysis   [] Wheeze  [] COPD   [] Asthma Neurologic:  [] Dizziness   [] Seizures   [] History of stroke   [] History of TIA  [] Aphasia   [] Vissual changes   [] Weakness or numbness in arm   [] Weakness or numbness in leg Musculoskeletal:   [] Joint swelling   [] Joint pain   [] Low back pain Hematologic:  [] Easy bruising  [] Easy bleeding   [] Hypercoagulable state   [] Anemic Gastrointestinal:  [] Diarrhea   [] Vomiting  [] Gastroesophageal reflux/heartburn   [] Difficulty swallowing. Genitourinary:  [] Chronic kidney disease   [] Difficult urination  [] Frequent urination   [] Blood  in urine Skin:  [] Rashes   [] Ulcers  Psychological:  [] History of anxiety   []  History of major depression.  Physical Examination  Vitals:   03/26/21 0936  BP: (!) 141/85  Pulse: 84  Resp: 16  Temp: 97.6 F (36.4 C)  TempSrc: Oral  SpO2: 99%  Weight: 90.7 kg  Height: 5\' 7"  (1.702 m)   Body mass index is 31.32 kg/m. Gen: WD/WN, NAD Head: [place]/AT, No temporalis wasting.  Ear/Nose/Throat: Hearing grossly intact, nares w/o erythema or drainage Eyes: PER, EOMI, sclera nonicteric.  Neck: Supple, no masses.  No bruit or JVD.  Pulmonary:  Good air movement, no audible wheezing, no use of accessory muscles.  Cardiac: RRR, normal S1, S2, no Murmurs. Vascular: Palpable pulsatile abdominal mass Vessel Right Left  Radial Palpable Palpable  Carotid Palpable Palpable  PT Palpable Palpable  DP Palpable Palpable  Gastrointestinal: soft, non-distended. No guarding/no peritoneal signs.  Musculoskeletal: M/S 5/5 throughout.  No visible deformity.  Neurologic: CN 2-12 intact. Pain and light touch intact in extremities.  Symmetrical.  Speech is fluent. Motor exam as listed above. Psychiatric: Judgment intact, Mood & affect appropriate for pt's clinical situation. Dermatologic: No rashes or ulcers noted.  No changes consistent with cellulitis.   CBC Lab Results  Component Value Date   WBC 10.6 (H) 03/24/2021   HGB 14.9 03/24/2021   HCT 41.0 03/24/2021   MCV 88.0 03/24/2021   PLT 277 03/24/2021    BMET    Component Value Date/Time   NA 126 (L) 03/24/2021 0850   K 3.2 (L) 03/24/2021 0850   CL 89 (L) 03/24/2021 0850   CO2 28 03/24/2021 0850   GLUCOSE 91 03/24/2021 0850   BUN 16 03/24/2021 0850   CREATININE 1.00 03/24/2021 0850   CREATININE 1.12 07/27/2014 1150   CALCIUM 9.0 03/24/2021 0850   GFRNONAA >60 03/24/2021 0850   GFRAA >60 08/15/2018 1302   Estimated Creatinine Clearance: 67.5 mL/min (by C-G formula based on SCr of 1 mg/dL).  COAG Lab Results  Component Value Date    INR 1.30 10/20/2012   INR 1.03 10/19/2012    Radiology No results found.   Assessment/Plan 1. AAA (abdominal aortic aneurysm) without rupture (HCC) Recommend: The aneurysm is > 5 cm and therefore should undergo repair. Patient is status post CT scan of the abdominal aorta. The patient is a candidate for endovascular repair.    The patient will continue antiplatelet therapy as prescribed (since the patient is undergoing endovascular repair as opposed to open repair) as well as aggressive management of hyperlipidemia. Exercise is again strongly encouraged.    The patient is reminded that lifetime routine surveillance is a necessity with an endograft.    The risks and benefits of AAA repair are reviewed with the patient.  All questions are answered.  Alternative therapies are also discussed.  The patient agrees to proceed with endovascular aneurysm repair.   Patient will follow-up with me in the office after the surgery.    2. Coronary artery disease due to lipid rich plaque Continue cardiac and antihypertensive medications as already ordered and reviewed, no changes at this time.   Continue statin as ordered and reviewed, no changes at this time   Nitrates PRN for chest pain    3. Primary hypertension Continue antihypertensive medications as already ordered, these medications have been reviewed and there are no changes at this time.    4. COPD, severe (HCC) Continue pulmonary medications and aerosols as already ordered, these medications have been reviewed and there are no changes at this time.     5. Mixed hyperlipidemia Continue statin as ordered and reviewed, no changes at this time    [name], MD  03/26/2021 10:24 AM

## 2021-03-26 NOTE — Anesthesia Preprocedure Evaluation (Signed)
Anesthesia Evaluation  Patient identified by MRN, date of birth, ID band Patient awake    Reviewed: Allergy & Precautions, NPO status , Patient's Chart, lab work & pertinent test results  Airway Mallampati: III  TM Distance: >3 FB Neck ROM: full    Dental  (+) Lower Dentures, Upper Dentures   Pulmonary neg pulmonary ROS, shortness of breath and with exertion, COPD,  COPD inhaler, former smoker,    Pulmonary exam normal        Cardiovascular hypertension, + angina + CAD, + Past MI and + Peripheral Vascular Disease  negative cardio ROS Normal cardiovascular exam+ dysrhythmias      Neuro/Psych negative neurological ROS  negative psych ROS   GI/Hepatic negative GI ROS, Neg liver ROS,   Endo/Other  negative endocrine ROS  Renal/GU Renal diseaseNephrolithiasis     Musculoskeletal   Abdominal (+) + obese,   Peds  Hematology negative hematology ROS (+)   Anesthesia Other Findings Past Medical History: 12/11/2020: Aneurysm of infrarenal abdominal aorta (HCC)     Comment:  a.) saccular configuration; measured 7.7 cm on CTA done               on 01/03/2021 No date: Aortic atherosclerosis (HCC) No date: Complication of anesthesia     Comment:  patient thinks he has some breathing difficulties after               anesthesia 10/20/2012: COPD, severe (HCC) No date: Coronary artery disease 10/18/2012: Dyslipidemia No date: History of kidney stones No date: Hypertension No date: Incomplete right bundle branch block (RBBB) No date: Myocardial infarction [facility]) No date: PAD (peripheral artery disease) (HCC)     Comment:  a.) CTA on 01/03/2021 --> mild BILATERAL CFA stenosis;               hypogastric artery narrowed at origin; BILATERAL iliac               arteries with mild-moderate atherosclerosis (no high               grade stenosis); BILATERAL (L>R) renal artery stenosis               (narrowing on LEFT secondary to  atherosclerotic plaque               and mass effect from superior aspect of infrarenal               aneurysm sac); mild SMA atherosclerosis No date: Pulmonary nodule     Comment:  a.) 6 mm RML nodule; stable. b.) punctate subpleural               nodule in anterior LUL; new on 12/11/20 CT. 10/20/2012: S/P CABG x 3     Comment:  LIMA to LAD, SVG to OM, SVG to PDA, EVH via right thigh               and leg (Dr. Cornelius Moras) No date: Varicose veins  Past Surgical History: 03/26/2003: CARDIAC CATHETERIZATION     Comment:  occluded RCA with collaterals from L to R, 60% mid Cfx               disease, 40% branch disease to OM, LAD with 50% stenosis               (Dr. Laurell Josephs)  08/19/2018: CATARACT EXTRACTION W/PHACO; Right     Comment:  Procedure: CATARACT EXTRACTION PHACO AND INTRAOCULAR  LENS PLACEMENT (IOC);  Surgeon: Fabio Pierce, MD;                Location: [facility];  Service: Ophthalmology;  Laterality:               Right;  CDE: 9.14 09/27/2018: CATARACT EXTRACTION W/PHACO; Left     Comment:  Procedure: CATARACT EXTRACTION PHACO AND INTRAOCULAR               LENS PLACEMENT LEFT EYE  (CDE: 5.11);  Surgeon: Fabio Pierce, MD;  Location: [facility];  Service: Ophthalmology;                Laterality: Left; No date: CHOLECYSTECTOMY 2017: COLONOSCOPY 10/20/2012: CORONARY ARTERY BYPASS GRAFT; N/A     Comment:  Procedure: CORONARY ARTERY BYPASS GRAFTING (CABG);                Surgeon: Purcell Nails, MD;  Location: [facility] OR;  Service:              Open Heart Surgery;  Laterality: N/A; 10/20/2012: INTRAOPERATIVE TRANSESOPHAGEAL ECHOCARDIOGRAM; N/A     Comment:  Procedure: INTRAOPERATIVE TRANSESOPHAGEAL               ECHOCARDIOGRAM;  Surgeon: Purcell Nails, MD;  Location:              [facility] OR;  Service: Open Heart Surgery;  Laterality: N/A; 10/19/2012: LEFT HEART CATHETERIZATION WITH CORONARY ANGIOGRAM; N/A     Comment:  Procedure: LEFT HEART CATHETERIZATION WITH  CORONARY               ANGIOGRAM;  Surgeon: Chrystie Nose, MD;  Location: [facility]               CATH LAB;  Service: Cardiovascular;  Laterality: N/A; 2002: SHOULDER SURGERY; Right  BMI    Body Mass Index: 31.32 kg/m      Reproductive/Obstetrics negative OB ROS                             Anesthesia Physical Anesthesia Plan  ASA: 3  Anesthesia Plan: General ETT   Post-op Pain Management:    Induction: Intravenous  PONV Risk Score and Plan: Ondansetron, Dexamethasone, Midazolam and Treatment may vary due to age or medical condition  Airway Management Planned: Oral ETT  Additional Equipment:   Intra-op Plan:   Post-operative Plan: Extubation in OR  Informed Consent: I have reviewed the patients History and Physical, chart, labs and discussed the procedure including the risks, benefits and alternatives for the proposed anesthesia with the patient or authorized representative who has indicated his/her understanding and acceptance.     Dental Advisory Given  Plan Discussed with: Anesthesiologist, CRNA and Surgeon  Anesthesia Plan Comments: (Patient consented for risks of anesthesia including but not limited to:  - adverse reactions to medications - damage to eyes, teeth, lips or other oral mucosa - nerve damage due to positioning  - sore throat or hoarseness - Damage to heart, brain, nerves, lungs, other parts of body or loss of life  Patient voiced understanding.)        Anesthesia Quick Evaluation

## 2021-03-26 NOTE — Op Note (Signed)
OPERATIVE NOTE   PROCEDURE: US guidance for vascular access, bilateral femoral arteries Catheter placement into aorta from bilateral femoral approaches Placement of a 28 x 14 x 12 conformable Gore Excluder Endoprosthesis main body with a 18 x 10 ipsilateral limb with a 20 x 12 contralateral limb ProGlide closure devices bilateral femoral arteries  PRE-OPERATIVE DIAGNOSIS: AAA  POST-OPERATIVE DIAGNOSIS: same  SURGEON: [name], MD and Festus Barren, MD - Co-surgeons  ANESTHESIA: general  ESTIMATED BLOOD LOSS: 50 cc  FINDING(S): 1.  AAA greater than 7 cm  SPECIMEN(S):  none  INDICATIONS:   [name] is a 75 y.o. y.o. male who presents with a 7.8 cm abdominal aortic aneurysm.  CT angiography demonstrates the anatomy is appropriate for endovascular repair.  Risk and benefits of Endo repair were reviewed with the patient alternative therapies including open surgery were reviewed all questions have been answered patient wishes to proceed.  DESCRIPTION: After obtaining full informed written consent, the patient was brought back to the operating room and placed supine upon the operating table.  The patient received IV antibiotics prior to induction.  After obtaining adequate anesthesia, the patient was prepped and draped in the standard fashion for endovascular AAA repair.  Co-surgeons are required because this is a complex bilateral procedure with work being performed simultaneously from both the right femoral and left femoral approach.  This also expedites the procedure making a shorter operative time reducing complications and improving patient safety.  We then began by gaining access to both femoral arteries with US guidance with me working on the patient's right and Dr. Wyn Quaker working on the patient's left.  The femoral arteries were found to be patent and accessed without difficulty with a needle under ultrasound guidance without difficulty on each side and permanent images were  recorded.  We then placed 2 proglide devices on each side in a pre-close fashion and placed 8 French sheaths.  The patient was then given 5000 units of intravenous heparin.   The Pigtail catheter was placed into the aorta from the right side. Using this image, we selected a 28 x 14 x 12 conformable Main body device.  Over a stiff wire, an 81 French sheath was placed. The main body was then placed through the 18 French sheath. A pigtail catheter was placed up the left side and a magnified image at the renal arteries was performed. The main body was then deployed just below the lowest renal artery.  We did reach constrain and reopen the device 1 time to bring it closer to the inferior margin of the right renal artery.  Once the upper portion of the main body was deployed the Kumpe catheter was used to cannulate the contralateral gate without difficulty and successful cannulation was confirmed by twirling the pigtail catheter in the main body. We then placed a stiff wire and a retrograde arteriogram was performed through the left femoral sheath. We upsized to the 12 [place] sheath for the contralateral limb and a 20 x 12 limb was selected and deployed. The main body deployment was then completed.  LAO projection of the pelvis was then obtained and hand-injection through the 18 French sheath was used to localize the origin of the right internal iliac artery.  Based off the angiographic findings, right extension limb necessary given the short limb of the contralateral main body.  A 18 x 10 ipsilateral limb was then opened onto the field advanced up the right side and deployed overlapping the main  body. All junction points and seals zones were treated with the compliant balloon.   The pigtail catheter was then replaced and a completion angiogram was performed.   No endoleak was detected on completion angiography. The renal arteries were found to be widely patent.  Right internal iliac artery is widely patent however  the left internal iliac artery appears to have been covered.  The left external iliac artery is widely patent with the stent well approximated.  At this point we elected to terminate the procedure. We secured the pro glide devices for hemostasis on the femoral arteries. The skin incision was closed with a 4-0 Monocryl. Dermabond and pressure dressing were placed. The patient was taken to the recovery room in stable condition having tolerated the procedure well.  COMPLICATIONS: none  CONDITION: stable  [name]  03/26/2021, 12:49 PM

## 2021-03-26 NOTE — Progress Notes (Signed)
Pt received from PACU post endovascular repair of AAA. B PAD in place, both level 0. BLE warm and dry, B femoral, posterior tibial and dorsalis pedis palpable, marked. Regular diet started, tolerated well. PRN analgesia given for R hip pain. After discussion with MD, foley removed due to pt discomfort, was able to urinate well thereafter. O2 weaned off to RA - sats maintained >88% (per pt, this is his baseline, has COPD). No other issues noted.

## 2021-03-26 NOTE — Progress Notes (Signed)
Patient awake/alert x4. Moves all ext without event. Endovascular repair AAA bil groins with closure of sutures/dermabond and PAD 78ml:  leave till am per vascular.  No bleeding noted, no hematoma noted.  Pulses intact, warm to touch. SBP remains greater then 100 in pacu.  Closure bil at 1249.

## 2021-03-26 NOTE — Transfer of Care (Signed)
Immediate Anesthesia Transfer of Care Note  Patient: [name]  Procedure(s) Performed: ENDOVASCULAR REPAIR/STENT GRAFT  Patient Location: PACU  Anesthesia Type:General  Level of Consciousness: awake and sedated  Airway & Oxygen Therapy: Patient Spontanous Breathing and Patient connected to face mask oxygen  Post-op Assessment: Report given to RN and Post -op Vital signs reviewed and stable  Post vital signs: Reviewed and stable  Last Vitals:  Vitals Value Taken Time  BP    Temp    Pulse 70 03/26/21 1300  Resp 19 03/26/21 1300  SpO2 98 % 03/26/21 1300  Vitals shown include unvalidated device data.  Last Pain:  Vitals:   03/26/21 0936  TempSrc: Oral  PainSc: 0-No pain         Complications: No notable events documented.

## 2021-03-26 NOTE — Interval H&P Note (Signed)
History and Physical Interval Note:  03/26/2021 10:29 AM  [name]  has presented today for surgery, with the diagnosis of Endovascular AAA Repair   GORE  ANESTHESIA   AAA.  The various methods of treatment have been discussed with the patient and family. After consideration of risks, benefits and other options for treatment, the patient has consented to  Procedure(s): ENDOVASCULAR REPAIR/STENT GRAFT (N/A) as a surgical intervention.  The patient's history has been reviewed, patient examined, no change in status, stable for surgery.  I have reviewed the patient's chart and labs.  Questions were answered to the patient's satisfaction.     [name]

## 2021-03-26 NOTE — Op Note (Signed)
OPERATIVE NOTE   PROCEDURE: US guidance for vascular access, bilateral femoral arteries Catheter placement into aorta from bilateral femoral approaches Placement of a 28 mm proximal 12 cm length C3 Gore Excluder Endoprosthesis main body right with a 20 mm x 12 cm left iliac contralateral limb Placement of an 18 mm diameter by 10 cm length right iliac extension limb ProGlide closure devices bilateral femoral arteries  PRE-OPERATIVE DIAGNOSIS: AAA  POST-OPERATIVE DIAGNOSIS: same  SURGEON: Festus Barren, MD and [name], MD - Co-surgeons  ANESTHESIA: general  ESTIMATED BLOOD LOSS: 25 cc  FINDING(S): 1.  AAA  SPECIMEN(S):  none  INDICATIONS:   [name] is a 75 y.o. 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 The anatomy was suitable for endovascular repair.  Risks and benefits of repair in an endovascular fashion were discussed and informed consent was obtained. Co-surgeons are used to expedite the procedure and reduce operative time as bilateral work needs to be done.  DESCRIPTION: After obtaining full informed written consent, the patient was brought back to the operating room and placed supine upon the operating table.  The patient received IV antibiotics prior to induction.  After obtaining adequate anesthesia, the patient was prepped and draped in the standard fashion for endovascular AAA repair.  We then began by gaining access to both femoral arteries with US guidance with me working on the left and Dr. Gilda Crease working on the right.  The femoral arteries were found to be patent and accessed without difficulty with a needle under ultrasound guidance without difficulty on each side and permanent images were recorded.  We then placed 2 proglide devices on each side in a pre-close fashion and placed 8 French sheaths. The patient was then given 5000 units of intravenous heparin. The Pigtail  catheter was placed into the aorta from the right side. Using this image, we selected a 28 mm diameter by 12 cm length Gore C3 Main body device.  Over a stiff wire, an 4 French sheath was placed up the right. The main body was then placed through the 18 French sheath. A Kumpe catheter was placed up the left side and a magnified image at the renal arteries was performed. The main body was then deployed just below the lowest renal artery which was the right. The Kumpe catheter was used to cannulate the contralateral gate without difficulty and successful cannulation was confirmed by twirling the pigtail catheter in the main body. We then placed a stiff wire and a retrograde arteriogram was performed through the left femoral sheath. We upsized to the 12 [place] sheath on the left side for the contralateral limb and a 20 mm diameter by 12 cm length left iliac limb was selected and deployed. The main body deployment was then completed. Based off the angiographic findings, extension limbs were necessary.  An 18 mm diameter by 10 cm length right iliac extension limb was taken down to just above the right hypogastric artery. All junction points and seals zones were treated with the compliant balloon. The pigtail catheter was then replaced and a completion angiogram was performed.  No obvious endoleak was detected on completion angiography. The renal arteries were found to be widely patent . At this point we elected to terminate the procedure. We secured the pro glide devices for hemostasis on the femoral arteries. The skin incision was closed with a 4-0 Monocryl. Dermabond and pressure dressing were placed.  The patient was taken to the recovery room in stable condition having tolerated the procedure well.  COMPLICATIONS: none  CONDITION: stable  Festus Barren  03/26/2021, 1:04 PM   This note was created with Dragon Medical transcription system. Any errors in dictation are purely unintentional.

## 2021-03-27 ENCOUNTER — Encounter: Payer: Self-pay | Admitting: Vascular Surgery

## 2021-03-27 DIAGNOSIS — I714 Abdominal aortic aneurysm, without rupture: Principal | ICD-10-CM

## 2021-03-27 LAB — BASIC METABOLIC PANEL
Anion gap: 5 (ref 5–15)
BUN: 19 mg/dL (ref 8–23)
CO2: 29 mmol/L (ref 22–32)
Calcium: 8.5 mg/dL — ABNORMAL LOW (ref 8.9–10.3)
Chloride: 94 mmol/L — ABNORMAL LOW (ref 98–111)
Creatinine, Ser: 1.11 mg/dL (ref 0.61–1.24)
GFR, Estimated: >60 mL/min (ref >60)
Glucose, Bld: 119 mg/dL — ABNORMAL HIGH (ref 70–99)
Potassium: 4.3 mmol/L (ref 3.5–5.1)
Sodium: 128 mmol/L — ABNORMAL LOW (ref 135–145)

## 2021-03-27 LAB — CBC
HCT: 35.3 % — ABNORMAL LOW (ref 39.0–52.0)
Hemoglobin: 12.5 g/dL — ABNORMAL LOW (ref 13.0–17.0)
MCH: 31.5 pg (ref 26.0–34.0)
MCHC: 35.4 g/dL (ref 30.0–36.0)
MCV: 88.9 fL (ref 80.0–100.0)
Platelets: 219 10*3/uL (ref 150–400)
RBC: 3.97 MIL/uL — ABNORMAL LOW (ref 4.22–5.81)
RDW: 11.6 % (ref 11.5–15.5)
WBC: 12.5 10*3/uL — ABNORMAL HIGH (ref 4.0–10.5)
nRBC: 0 % (ref 0.0–0.2)

## 2021-03-27 MED ORDER — CLOPIDOGREL BISULFATE 75 MG PO TABS: 75.0000 mg | ORAL_TABLET | Freq: Every day | ORAL | 11 refills | Status: DC

## 2021-03-27 MED ORDER — SODIUM CHLORIDE 1 G PO TABS
2.0000 g | ORAL_TABLET | Freq: Once | ORAL | Status: AC
  Administered 2021-03-27: 2 g via ORAL
  Filled 2021-03-27: qty 2

## 2021-03-27 MED ORDER — FAMOTIDINE 20 MG PO TABS
20.0000 mg | ORAL_TABLET | Freq: Two times a day (BID) | ORAL | Status: DC
  Administered 2021-03-27: 20 mg via ORAL
  Filled 2021-03-27: qty 1

## 2021-03-27 NOTE — Progress Notes (Signed)
Patient alert and oriented. No complaints of pain or shortness of breath. Patients being discharged home. Patients IV's removed, helped get dressed. Patient has his clothing and dentures. Right and left groin intact. No bleeding or hematoma. Doppler pedal pulses. Patient has follow up appointment with Dr. Manson Passey PA Friday sept 30 at 1:30. Went over discharge instructions, patient had no further questions.

## 2021-03-27 NOTE — Anesthesia Postprocedure Evaluation (Signed)
Anesthesia Post Note  Patient: [name]  Procedure(s) Performed: ENDOVASCULAR REPAIR/STENT GRAFT  Patient location during evaluation: PACU Anesthesia Type: General Level of consciousness: awake and alert Pain management: pain level controlled Vital Signs Assessment: post-procedure vital signs reviewed and stable Respiratory status: spontaneous breathing, nonlabored ventilation, respiratory function stable and patient connected to nasal cannula oxygen Cardiovascular status: blood pressure returned to baseline and stable Postop Assessment: no apparent nausea or vomiting Anesthetic complications: no   No notable events documented.   Last Vitals:  Vitals:   03/27/21 0400 03/27/21 0500  BP: 132/65 130/62  Pulse: (!) 56 (!) 57  Resp: 20 20  Temp: 36.8 C   SpO2: 94% 94%    Last Pain:  Vitals:   03/27/21 0400  TempSrc: Oral  PainSc:                  Johny Blamer

## 2021-03-27 NOTE — Discharge Instructions (Signed)
1) You may shower as of tomorrow.  Please keep your groins clean and dry.  Gently clean your groin with soap and water.  Gently pat dry. 2) Please do not engage in sinus activity or lifting greater than 10 pounds for at least 3 weeks. 3) Please do not drive for at least 2 weeks.

## 2021-03-27 NOTE — Progress Notes (Signed)
Patient with PAD in place to bilateral groin. No bleeding or hematomas noted. No complaints of pain. Pulses +2 noted to bilateral lower extremities.Call bell in reach. Will continue to monitor.

## 2021-03-27 NOTE — Progress Notes (Signed)
Patient ambulated around ICU unit. Heart rate 98-110. Oxygen level dropped to 86. Once patient took deep breaths, oxygen level back up to 93%-/94% on room air. Patient had no complaints of shortness of breath or pain. Dr Wyn Quaker at bedside and updated. Per MD patient being discharged home.

## 2021-03-27 NOTE — Progress Notes (Signed)
PHARMACIST - PHYSICIAN COMMUNICATION  CONCERNING: IV to Oral Route Change Policy  RECOMMENDATION: This patient is receiving famotidine by the intravenous route.  Based on criteria approved by the Pharmacy and Therapeutics Committee, the intravenous medication(s) is/are being converted to the equivalent oral dose form(s).   DESCRIPTION: These criteria include: The patient is eating (either orally or via tube) and/or has been taking other orally administered medications for a least 24 hours The patient has no evidence of active gastrointestinal bleeding or impaired GI absorption (gastrectomy, short bowel, patient on TNA or NPO).  If you have questions about this conversion, please contact the Pharmacy Department    Tressie Ellis, [facility] 03/27/2021 8:52 AM

## 2021-03-27 NOTE — Discharge Summary (Signed)
[facility]    Discharge Summary  Patient ID:  [name] MRN: [id number] DOB/AGE: [birth year] 75 y.o.  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   Coronary artery disease    Dyslipidemia 10/18/2012   History of kidney stones    Hypertension    Incomplete right bundle branch block (RBBB)    Myocardial infarction [facility])    PAD (peripheral artery disease) (HCC)    a.) CTA on 01/03/2021 --> mild BILATERAL CFA stenosis; hypogastric artery narrowed at origin; BILATERAL iliac arteries with mild-moderate atherosclerosis (no high grade stenosis); 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 b.) punctate subpleural nodule in anterior LUL; new on 12/11/20 CT.   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 Cornelius Moras)   Varicose veins    Procedure(s): 03/26/21: Abdominal aortic aneurysm repair  Discharged Condition: Good  HPI / Hospital Course:  US guidance for vascular access, bilateral femoral arteries Catheter placement into aorta from bilateral femoral approaches Placement of a 28 mm proximal 12 cm length C3 Gore  Excluder Endoprosthesis main body right with a 20 mm x 12 cm left iliac contralateral limb Placement of an 18 mm diameter by 10 cm length right iliac extension limb ProGlide closure devices bilateral femoral arteries  Physical Exam:  A&Ox3, NAD CV: RRR Pulm: CTA Bilaterally Abdomen: Soft, non-tender, non-distended Right Groin: Access site is clean dry and intact Left Groin: Access site is clean dry and intact Extremity: Minimal edema.  Warm distally to toes.  Labs: as below  Complications: None  Consults: None  Significant Diagnostic Studies: CBC Lab Results  Component Value Date   WBC 12.5 (H) 03/27/2021   HGB 12.5 (L) 03/27/2021   HCT 35.3 (L) 03/27/2021   MCV 88.9 03/27/2021   PLT 219 03/27/2021   BMET    Component Value Date/Time   NA 128 (L) 03/27/2021 0244   K 4.3 03/27/2021 0244   CL 94 (L) 03/27/2021 0244   CO2 29 03/27/2021 0244   GLUCOSE 119 (H) 03/27/2021 0244   BUN 19 03/27/2021 0244   CREATININE 1.11 03/27/2021 0244   CREATININE 1.12 07/27/2014 1150   CALCIUM 8.5 (L) 03/27/2021 0244   GFRNONAA >60 03/27/2021 0244   GFRAA >60 08/15/2018 1302   COAG Lab Results  Component Value Date   INR 1.30 10/20/2012   INR 1.03 10/19/2012   Disposition:  Discharge to :Home  Allergies as of 03/27/2021   No Known Allergies      Medication List     TAKE these medications    albuterol 108 (90 Base) MCG/ACT inhaler Commonly known as: VENTOLIN HFA Inhale 1-2 puffs into the lungs every 6 (  six) hours as needed for wheezing or shortness of breath.   amLODipine 2.5 MG tablet Commonly known as: NORVASC Take 1 tablet (2.5 mg total) by mouth daily.   aspirin EC 81 MG tablet Take 81 mg by mouth daily. Swallow whole. What changed: Another medication with the same name was removed. Continue taking this medication, and follow the directions you see here.   atorvastatin 40 MG tablet Commonly known as: LIPITOR Take 40 mg by mouth daily.   clopidogrel 75 MG  tablet Commonly known as: PLAVIX Take 1 tablet (75 mg total) by mouth daily at 6 (six) AM. Start taking on: March 28, 2021   ibuprofen 200 MG tablet Commonly known as: ADVIL Take 400-600 mg by mouth every 6 (six) hours as needed for mild pain or moderate pain.   metoprolol succinate 25 MG 24 hr tablet Commonly known as: TOPROL-XL Take 25 mg by mouth daily.   quinapril-hydrochlorothiazide 10-12.5 MG tablet Commonly known as: ACCURETIC Take 1 tablet by mouth daily.   Trelegy Ellipta 100-62.5-25 MCG/INH Aepb Generic drug: Fluticasone-Umeclidin-Vilant Inhale 1 puff into the lungs daily.   Vitamin D 125 MCG (5000 UT) Caps Take by mouth.       Verbal and written Discharge instructions given to the patient. Wound care per Discharge AVS  Follow-up Information     Georgiana Spinner, NP Follow up in 2 week(s).   Specialty: Vascular Surgery Why: First post-op incision check. No studies. Contact information: [address] [place] [address] [phone]         Georgiana Spinner, NP Follow up in 2 week(s).   Specialty: Vascular Surgery Why: Esther Hardy April 11, 2021 AT 01:30PM Contact information: [address] [facility] [place] [address] [phone]                Signed: Tonette Lederer, PA-C 03/27/2021, 12:01 PM

## 2021-04-10 ENCOUNTER — Telehealth: Payer: Self-pay | Admitting: Internal Medicine

## 2021-04-10 MED ORDER — AMLODIPINE BESYLATE 2.5 MG PO TABS: 2.5000 mg | ORAL_TABLET | Freq: Every day | ORAL | 3 refills | Status: DC

## 2021-04-10 NOTE — Telephone Encounter (Signed)
*  STAT* If patient is at the pharmacy, call can be transferred to refill team.   1. Which medications need to be refilled? (please list name of each medication and dose if known) Amlodipine  2. Which pharmacy/location (including street and city if local pharmacy) is medication to be sent to? [facility]   3. Do they need a 30 day or 90 day supply? 90

## 2021-04-11 ENCOUNTER — Other Ambulatory Visit: Payer: Self-pay

## 2021-04-11 ENCOUNTER — Ambulatory Visit (INDEPENDENT_AMBULATORY_CARE_PROVIDER_SITE_OTHER): Payer: Medicare Other | Admitting: Nurse Practitioner

## 2021-04-11 VITALS — BP 157/75 | HR 69 | Ht 67.0 in | Wt 201.0 lb

## 2021-04-11 DIAGNOSIS — I714 Abdominal aortic aneurysm, without rupture, unspecified: Secondary | ICD-10-CM

## 2021-04-11 DIAGNOSIS — I7143 Infrarenal abdominal aortic aneurysm, without rupture: Secondary | ICD-10-CM

## 2021-04-11 DIAGNOSIS — I10 Essential (primary) hypertension: Secondary | ICD-10-CM

## 2021-04-20 ENCOUNTER — Encounter (INDEPENDENT_AMBULATORY_CARE_PROVIDER_SITE_OTHER): Payer: Self-pay | Admitting: Nurse Practitioner

## 2021-04-20 NOTE — Progress Notes (Signed)
Subjective:    Patient ID: [name], male    DOB: 1945-07-28, 75 y.o.   MRN: [id number] Chief Complaint  Patient presents with   Follow-up    2 wk [facility] post endo vascular stent    [name] is a 75 year old male that returns to the office for postoperative visit following an abdominal aortic aneurysm endovascular repair on 03/26/2021.  Patient denies abdominal pain or back pain, no other abdominal complaints. No groin related complaints. No symptoms consistent with distal embolization No changes in claudication distance.   There have been no interval changes in his overall healthcare since his last visit.   Patient denies amaurosis fugax or TIA symptoms. There is no history of claudication or rest pain symptoms of the lower extremities. The patient denies angina or shortness of breath.  Overall the patient is doing well postsurgery.    Review of Systems  Cardiovascular:  Negative for leg swelling.  Skin:  Negative for wound.  All other systems reviewed and are negative.     Objective:   Physical Exam Vitals reviewed.  HENT:     Head: Normocephalic.  Cardiovascular:     Rate and Rhythm: Normal rate.     Pulses: Normal pulses.  Pulmonary:     Effort: Pulmonary effort is normal.  Skin:    General: Skin is warm and dry.  Neurological:     Mental Status: He is alert and oriented to person, place, and time.  Psychiatric:        Mood and Affect: Mood normal.        Behavior: Behavior normal.        Thought Content: Thought content normal.        Judgment: Judgment normal.    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   Coronary artery disease    Dyslipidemia 10/18/2012   History of kidney stones    Hypertension    Incomplete right bundle branch block (RBBB)    Myocardial infarction [facility])    PAD (peripheral artery disease) (HCC)    a.) CTA on 01/03/2021 --> mild BILATERAL CFA stenosis; hypogastric artery narrowed at origin; BILATERAL iliac arteries with mild-moderate atherosclerosis (no high grade stenosis); 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 b.) punctate subpleural nodule in anterior LUL; new on 12/11/20 CT.   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 12/20/2012)   Varicose veins     Social History   Socioeconomic History   Marital status: Married    Spouse name: Not on file   Number of children: 3   Years of education: Not on file   Highest education level: Not on file  Occupational History   Not on file  Tobacco Use   Smoking status: Former  Packs/day: 2.00    Types: Cigarettes    Quit date: 10/11/2012    Years since quitting: 8.5   Smokeless tobacco: Never  Vaping Use   Vaping Use: Never used  Substance and Sexual Activity   Alcohol use: No   Drug use: No   Sexual activity: Not on file  Other Topics Concern   Not on file  Social History Narrative   Not on file   Social Determinants of Health   Financial Resource Strain: Not on file  Food Insecurity: Not on file  Transportation Needs: Not on file  Physical Activity: Not on file  Stress: Not on file  Social Connections: Not on file  Intimate Partner Violence: Not on file    Past Surgical History:  Procedure Laterality Date   CARDIAC CATHETERIZATION  03/26/2003   occluded RCA with collaterals from L to R, 60% mid Cfx disease, 40% branch disease to OM,  LAD with 50% stenosis (Dr. Laurell Josephs)    CATARACT EXTRACTION W/PHACO Right 08/19/2018   Procedure: CATARACT EXTRACTION PHACO AND INTRAOCULAR LENS PLACEMENT (IOC);  Surgeon: Fabio Pierce, MD;  Location: [facility];  Service: Ophthalmology;  Laterality: Right;  CDE: 9.14   CATARACT EXTRACTION W/PHACO Left 09/27/2018   Procedure: CATARACT EXTRACTION PHACO AND INTRAOCULAR LENS PLACEMENT LEFT EYE  (CDE: 5.11);  Surgeon: Fabio Pierce, MD;  Location: [facility];  Service: Ophthalmology;  Laterality: Left;   CHOLECYSTECTOMY     COLONOSCOPY  2017   CORONARY ARTERY BYPASS GRAFT N/A 10/20/2012   Procedure: CORONARY ARTERY BYPASS GRAFTING (CABG);  Surgeon: Purcell Nails, MD;  Location: [facility] OR;  Service: Open Heart Surgery;  Laterality: N/A;   ENDOVASCULAR REPAIR/STENT GRAFT N/A 03/26/2021   Procedure: ENDOVASCULAR REPAIR/STENT GRAFT;  Surgeon: Renford Dills, MD;  Location: [facility] INVASIVE CV LAB;  Service: Cardiovascular;  Laterality: N/A;   INTRAOPERATIVE TRANSESOPHAGEAL ECHOCARDIOGRAM N/A 10/20/2012   Procedure: INTRAOPERATIVE TRANSESOPHAGEAL ECHOCARDIOGRAM;  Surgeon: Purcell Nails, MD;  Location: [facility] OR;  Service: Open Heart Surgery;  Laterality: N/A;   LEFT HEART CATHETERIZATION WITH CORONARY ANGIOGRAM N/A 10/19/2012   Procedure: LEFT HEART CATHETERIZATION WITH CORONARY ANGIOGRAM;  Surgeon: Chrystie Nose, MD;  Location: [facility] CATH LAB;  Service: Cardiovascular;  Laterality: N/A;   SHOULDER SURGERY Right 2002    Family History  Problem Relation Age of Onset   Coronary artery disease Mother     No Known Allergies  CBC Latest Ref Rng & Units 03/27/2021 03/24/2021 02/24/2021  WBC 4.0 - 10.5 K/uL 12.5(H) 10.6(H) 17.3(H)  Hemoglobin 13.0 - 17.0 g/dL 12.5(L) 14.9 14.7  Hematocrit 39.0 - 52.0 % 35.3(L) 41.0 42.9  Platelets 150 - 400 K/uL 219 277 210      CMP     Component Value Date/Time   NA 128 (L) 03/27/2021 0244   K 4.3 03/27/2021 0244   CL 94 (L) 03/27/2021 0244   CO2 29 03/27/2021 0244    GLUCOSE 119 (H) 03/27/2021 0244   BUN 19 03/27/2021 0244   CREATININE 1.11 03/27/2021 0244   CREATININE 1.12 07/27/2014 1150   CALCIUM 8.5 (L) 03/27/2021 0244   PROT 6.5 02/23/2021 0540   ALBUMIN 3.6 02/23/2021 0540   AST 25 02/23/2021 0540   ALT 22 02/23/2021 0540   ALKPHOS 73 02/23/2021 0540   BILITOT 0.8 02/23/2021 0540   GFRNONAA >60 03/27/2021 0244   GFRAA >60 08/15/2018 1302     No results found.     Assessment & Plan:   1. AAA (abdominal aortic aneurysm)  without rupture [facility]) The patient is doing well post endovascular repair.  The groins have healed well the patient is returning to his baseline status.  We will have the patient return in approximately 3 months for noninvasive studies. - VAS [place] EVAR DUPLEX; Future  2. Primary hypertension Continue antihypertensive medications as already ordered, these medications have been reviewed and there are no changes at this time.    Current Outpatient Medications on File Prior to Visit  Medication Sig Dispense Refill   albuterol (VENTOLIN HFA) 108 (90 Base) MCG/ACT inhaler Inhale 1-2 puffs into the lungs every 6 (six) hours as needed for wheezing or shortness of breath.     amLODipine (NORVASC) 2.5 MG tablet Take 1 tablet (2.5 mg total) by mouth daily. 90 tablet 3   aspirin EC 81 MG tablet Take 81 mg by mouth daily. Swallow whole.     atorvastatin (LIPITOR) 40 MG tablet Take 40 mg by mouth daily.     Cholecalciferol (VITAMIN D) 125 MCG (5000 UT) CAPS Take by mouth.     clopidogrel (PLAVIX) 75 MG tablet Take 1 tablet (75 mg total) by mouth daily at 6 (six) AM. 30 tablet 11   Fluticasone-Umeclidin-Vilant (TRELEGY ELLIPTA) 100-62.5-25 MCG/INH AEPB Inhale 1 puff into the lungs daily.     ibuprofen (ADVIL) 200 MG tablet Take 400-600 mg by mouth every 6 (six) hours as needed for mild pain or moderate pain.     metoprolol succinate (TOPROL-XL) 25 MG 24 hr tablet Take 25 mg by mouth daily.     quinapril-hydrochlorothiazide (ACCURETIC)  10-12.5 MG tablet Take 1 tablet by mouth daily. 90 tablet 3   No current facility-administered medications on file prior to visit.    There are no Patient Instructions on file for this visit. Return in about 3 months (around 07/11/2021) for EVAR; JD/FB .   Georgiana Spinner, NP

## 2021-07-11 ENCOUNTER — Other Ambulatory Visit: Payer: Self-pay

## 2021-07-11 ENCOUNTER — Ambulatory Visit (INDEPENDENT_AMBULATORY_CARE_PROVIDER_SITE_OTHER): Payer: Medicare Other

## 2021-07-11 ENCOUNTER — Ambulatory Visit (INDEPENDENT_AMBULATORY_CARE_PROVIDER_SITE_OTHER): Payer: Medicare Other | Admitting: Vascular Surgery

## 2021-07-11 DIAGNOSIS — I7143 Infrarenal abdominal aortic aneurysm, without rupture: Secondary | ICD-10-CM

## 2021-07-17 ENCOUNTER — Encounter (INDEPENDENT_AMBULATORY_CARE_PROVIDER_SITE_OTHER): Payer: Self-pay | Admitting: *Deleted

## 2021-09-13 IMAGING — CT CT CHEST W/O CM
2 of 4 series · 14 of 36 positions shown, 17 images · non-contrast
Comparison: Most recent chest imaging CT 12/14/2017. Most remote
chest CT 07/27/2013

CLINICAL DATA: Lung nodule seen on imaging study. Follow-up right
middle lobe pulmonary nodule.

EXAM:
CT CHEST WITHOUT CONTRAST
TECHNIQUE: Multidetector CT imaging of the chest was performed following the
standard protocol without IV contrast.

[Series 2: thorax · axial · 0.77mm/px · z∈[-333,-47]mm · 11 of 169 slices shown, 14 images]
[im 13/169  mediastinal]
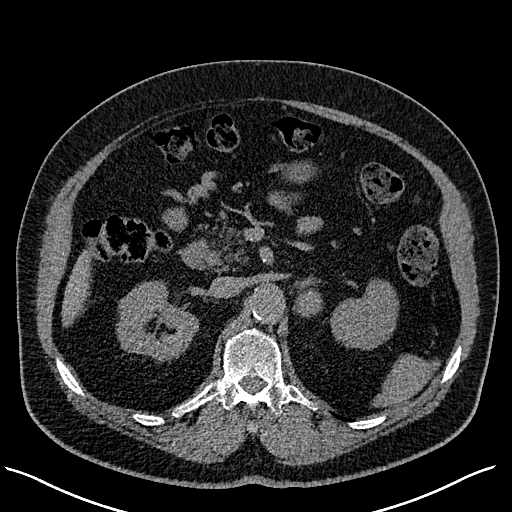
[im 13/169  lung]
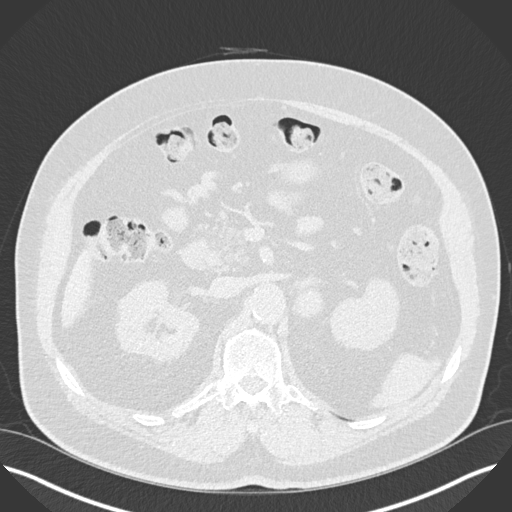
[im 26/169  lung]
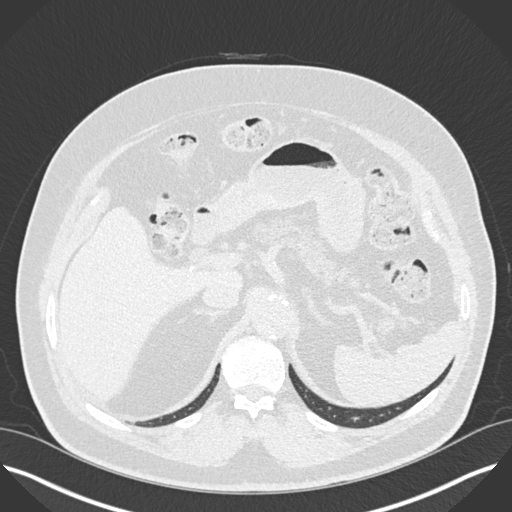
[im 39/169  lung]
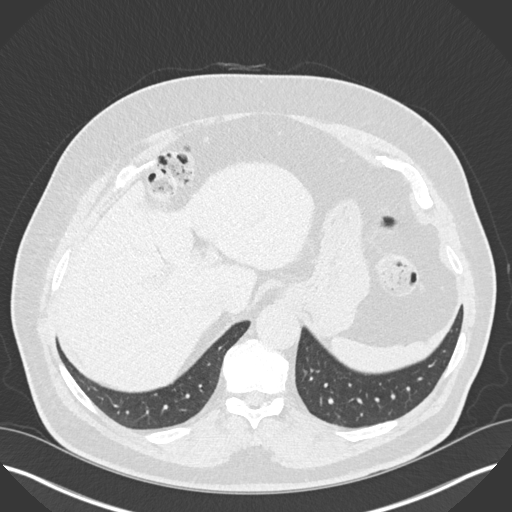
[im 52/169  lung]
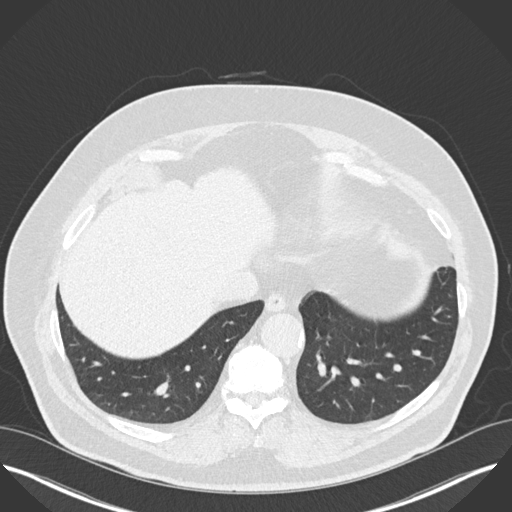
[im 65/169  mediastinal]
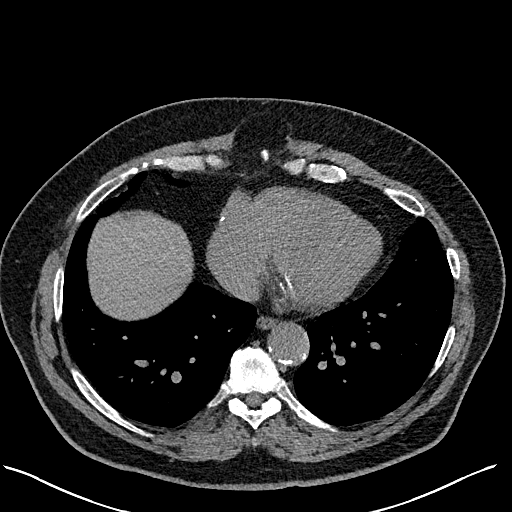
[im 65/169  lung]
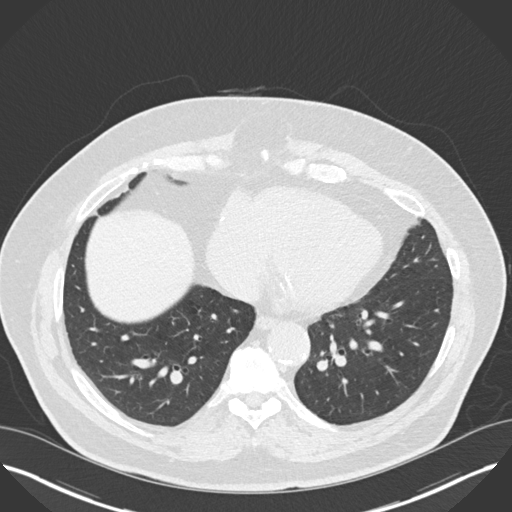
[im 91/169  lung]
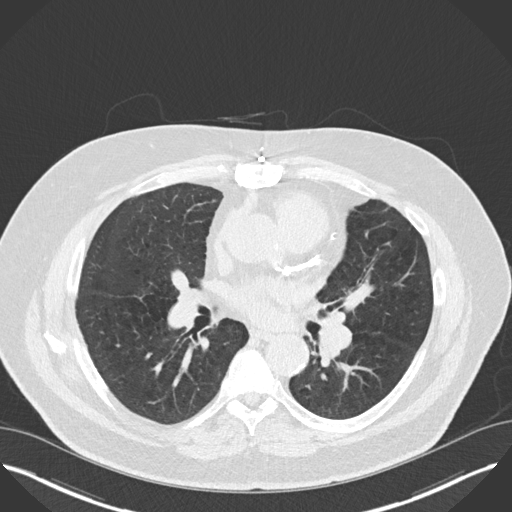
[im 104/169  lung]
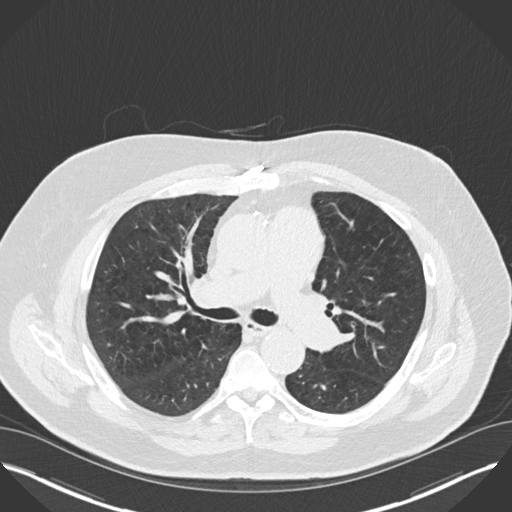
[im 117/169  lung]
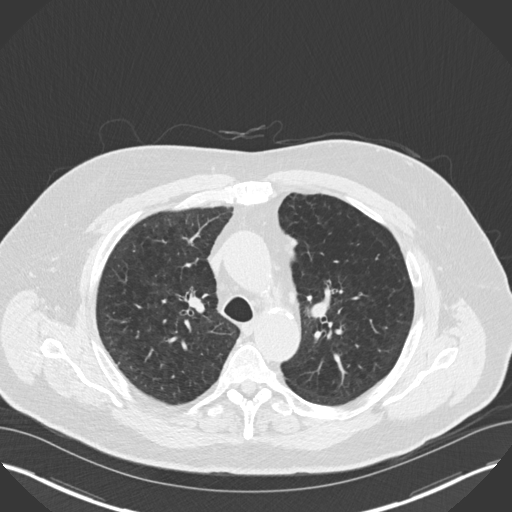
[im 130/169  mediastinal]
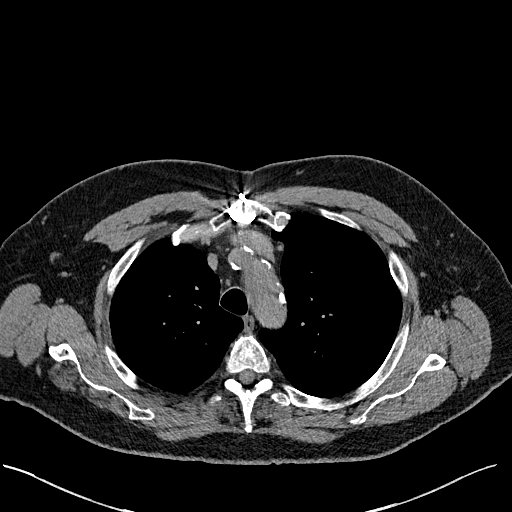
[im 130/169  lung]
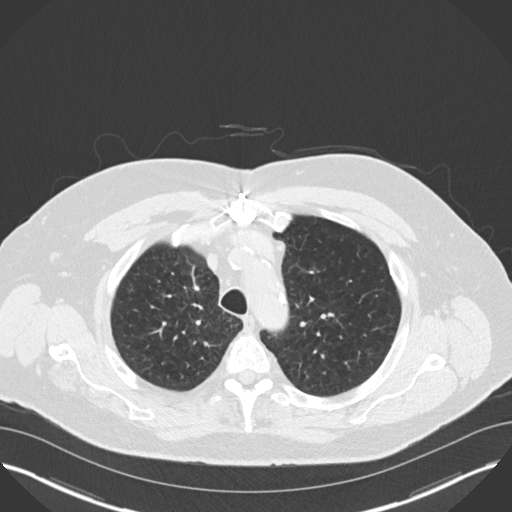
[im 143/169  lung]
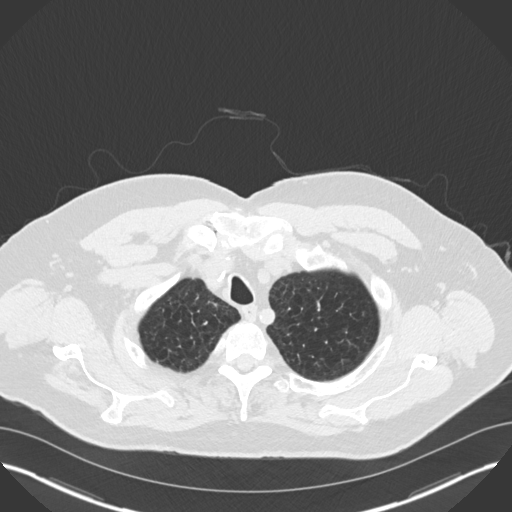
[im 156/169  lung]
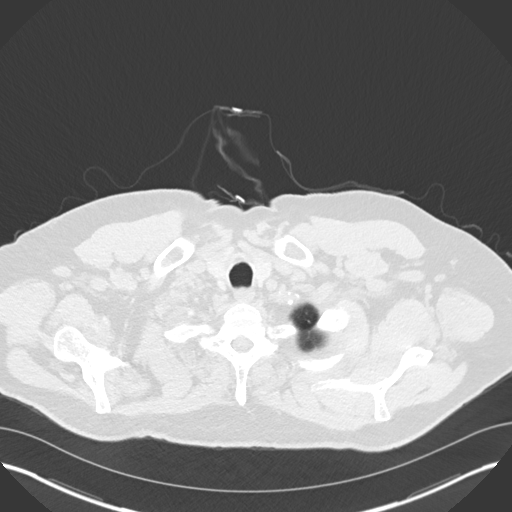

[Series 5: coronal · coronal · 0.68mm/px · 3 of 169 slices shown]
[im 34/169  lung]
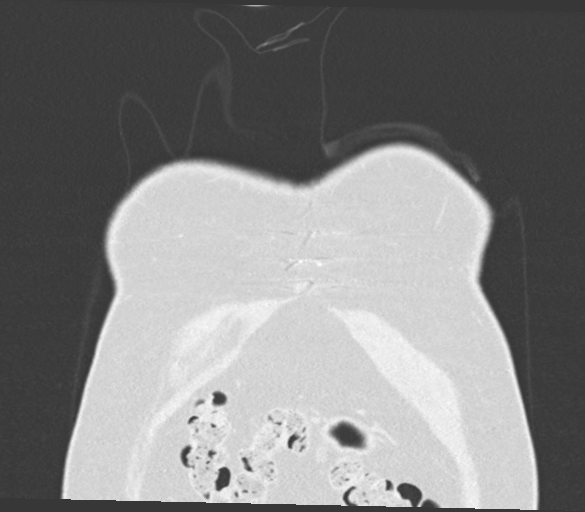
[im 68/169  lung]
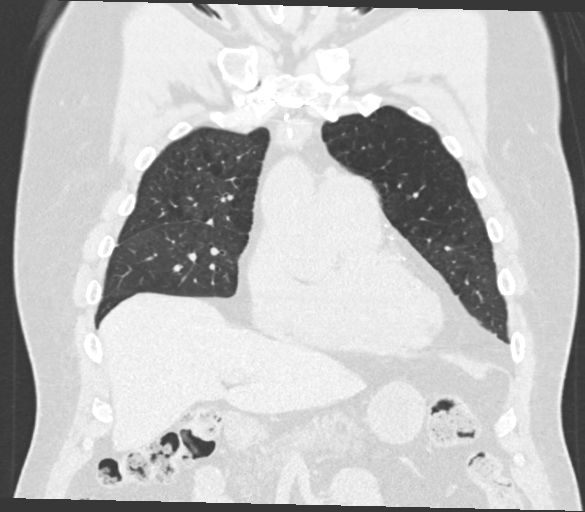
[im 101/169  lung]
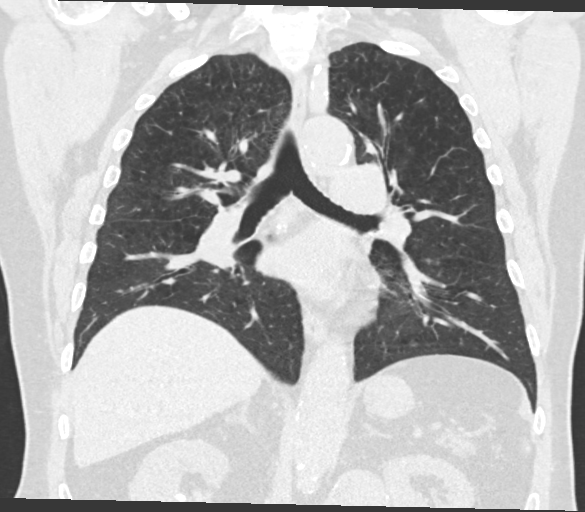

[14 of 36 positions shown; findings below may reference images not displayed]

FINDINGS: Cardiovascular: Aortic atherosclerosis at least moderate in degree.
Post CABG with calcification of native coronary arteries. Mild
dilatation of the main pulmonary artery at 3.7 cm. Heart is normal
in size. There is no pericardial effusion.

Mediastinum/Nodes: No enlarged mediastinal lymph nodes. Limited
assessment for hilar adenopathy on this noncontrast exam. No
axillary adenopathy. No visualized thyroid nodule. No esophageal
wall thickening.

Lungs/Pleura: Moderate emphysema. Punctate subpleural nodule in the
left upper lobe, series 4, image 56, new from prior exam. 6 mm
nodule in the right middle lobe, series 4, image 92, stable from
[facility]. Stability of 3 years is consistent with benign process.
Calcified granuloma in the right lower lobe, benign. No
consolidation or acute airspace disease. No findings of pulmonary
edema. There is no pleural fluid.

Upper Abdomen: Fatty atrophy of the pancreas. Nonobstructing 6 mm
intrarenal stone in the right kidney. Peripherally calcified
structure in the left retroperitoneum measuring 5.6 x 4.7 cm abuts
the aorta and may represent a saccular aneurysm, but is only
partially included in the field of view. No adjacent stranding.
Cholecystectomy. Subxiphoid hernia of intra-abdominal fat. Colonic
diverticulosis of the left colon.

Musculoskeletal: Post median sternotomy. There are no acute or
suspicious osseous abnormalities.
IMPRESSION: 1. Stable 6 mm right middle lobe pulmonary nodule from 6999,
considered benign. No dedicated imaging follow-up of this nodule is
needed.
2. However, there is a new punctate subpleural nodule in the
anterior left upper lobe.
3. Moderate emphysema. Consider yearly lung cancer screening CT if
patient is eligible. Alternatively, 1 year follow-up of this new
nodule is recommended.
4. Aortic atherosclerosis and coronary artery calcifications, post
CABG. Mild dilatation of the main pulmonary artery can be seen with
pulmonary arterial hypertension.
5. Partially included in the upper abdomen is a peripherally
calcified left retroperitoneal structure measuring at least 5.6 x
4.7 cm, not entirely included in the field of view, abutting the
abdominal aorta. Differential considerations include saccular
aneurysm or adenopathy. This was not previously included in the
field of view. In the absence of prior abdominal imaging for
characterization, recommend abdominal CTA or MRA for further
assessment.
6. Incidental right renal calculus.

Aortic Atherosclerosis (7X2OI-805.5) and Emphysema (7X2OI-AV3.Z).

These results will be called to the ordering clinician or
representative by the Radiologist Assistant, and communication
documented in the PACS or [REDACTED].

## 2021-10-09 ENCOUNTER — Other Ambulatory Visit (INDEPENDENT_AMBULATORY_CARE_PROVIDER_SITE_OTHER): Payer: Self-pay | Admitting: Vascular Surgery

## 2021-10-09 DIAGNOSIS — I714 Abdominal aortic aneurysm, without rupture, unspecified: Secondary | ICD-10-CM

## 2021-10-10 ENCOUNTER — Ambulatory Visit (INDEPENDENT_AMBULATORY_CARE_PROVIDER_SITE_OTHER): Payer: Medicare Other

## 2021-10-10 ENCOUNTER — Ambulatory Visit (INDEPENDENT_AMBULATORY_CARE_PROVIDER_SITE_OTHER): Payer: Medicare Other | Admitting: Vascular Surgery

## 2021-10-10 DIAGNOSIS — I714 Abdominal aortic aneurysm, without rupture, unspecified: Secondary | ICD-10-CM

## 2021-10-15 ENCOUNTER — Encounter (INDEPENDENT_AMBULATORY_CARE_PROVIDER_SITE_OTHER): Payer: Self-pay | Admitting: *Deleted

## 2022-04-08 ENCOUNTER — Other Ambulatory Visit (INDEPENDENT_AMBULATORY_CARE_PROVIDER_SITE_OTHER): Payer: Self-pay | Admitting: Vascular Surgery

## 2022-04-08 DIAGNOSIS — I714 Abdominal aortic aneurysm, without rupture, unspecified: Secondary | ICD-10-CM

## 2022-04-10 ENCOUNTER — Ambulatory Visit (INDEPENDENT_AMBULATORY_CARE_PROVIDER_SITE_OTHER): Payer: Medicare Other

## 2022-04-10 ENCOUNTER — Ambulatory Visit (INDEPENDENT_AMBULATORY_CARE_PROVIDER_SITE_OTHER): Payer: Medicare Other | Admitting: Vascular Surgery

## 2022-04-10 ENCOUNTER — Encounter (INDEPENDENT_AMBULATORY_CARE_PROVIDER_SITE_OTHER): Payer: Self-pay | Admitting: Vascular Surgery

## 2022-04-10 VITALS — BP 116/77 | HR 67 | Resp 18 | Ht 67.0 in | Wt 208.2 lb

## 2022-04-10 DIAGNOSIS — E782 Mixed hyperlipidemia: Secondary | ICD-10-CM

## 2022-04-10 DIAGNOSIS — I7143 Infrarenal abdominal aortic aneurysm, without rupture: Secondary | ICD-10-CM

## 2022-04-10 DIAGNOSIS — I10 Essential (primary) hypertension: Secondary | ICD-10-CM | POA: Diagnosis not present

## 2022-04-10 DIAGNOSIS — I714 Abdominal aortic aneurysm, without rupture, unspecified: Secondary | ICD-10-CM

## 2022-04-10 NOTE — Assessment & Plan Note (Signed)
lipid control important in reducing the progression of atherosclerotic disease. Continue statin therapy  

## 2022-04-10 NOTE — Assessment & Plan Note (Signed)
Duplex shows a patent stent graft without endoleak and aneurysm sac size that has decreased down to just over 3 cm in maximal diameter.  We are 1 year out from surgery at this point.  We can go to an annual follow-up.  Continue current medical regimen.

## 2022-04-10 NOTE — Progress Notes (Signed)
MRN : [id number]  JOFFREY [name] is a 76 y.o. (28-Jan-1946) male who presents with chief complaint of No chief complaint on file. Marland Kitchen  History of Present Illness: Patient returns today in follow up of his abdominal aortic aneurysm.  He is doing well today.  He has no aneurysm related symptoms. Specifically, the patient denies new back or abdominal pain, or signs of peripheral embolization.  Duplex shows a patent stent graft without endoleak and aneurysm sac size that has decreased down to just over 3 cm in maximal diameter.  Current Outpatient Medications  Medication Sig Dispense Refill   albuterol (VENTOLIN HFA) 108 (90 Base) MCG/ACT inhaler Inhale 1-2 puffs into the lungs every 6 (six) hours as needed for wheezing or shortness of breath.     amLODipine (NORVASC) 2.5 MG tablet Take 1 tablet (2.5 mg total) by mouth daily. 90 tablet 3   atorvastatin (LIPITOR) 40 MG tablet Take 40 mg by mouth daily.     Cholecalciferol (VITAMIN D) 125 MCG (5000 UT) CAPS Take by mouth.     clopidogrel (PLAVIX) 75 MG tablet Take 1 tablet (75 mg total) by mouth daily at 6 (six) AM. 30 tablet 11   Fluticasone-Umeclidin-Vilant (TRELEGY ELLIPTA) 100-62.5-25 MCG/INH AEPB Inhale 1 puff into the lungs daily.     ibuprofen (ADVIL) 200 MG tablet Take 400-600 mg by mouth every 6 (six) hours as needed for mild pain or moderate pain.     metoprolol succinate (TOPROL-XL) 25 MG 24 hr tablet Take 25 mg by mouth daily.     quinapril-hydrochlorothiazide (ACCURETIC) 10-12.5 MG tablet Take 1 tablet by mouth daily. 90 tablet 3   aspirin EC 81 MG tablet Take 81 mg by mouth daily. Swallow whole. (Patient not taking: Reported on 04/10/2022)     No current facility-administered medications for this visit.    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   Coronary artery disease    Dyslipidemia 10/18/2012   History of kidney stones    Hypertension    Incomplete right bundle branch block (RBBB)    Myocardial infarction [facility])    PAD (peripheral artery disease) (HCC)    a.) CTA on 01/03/2021 --> mild BILATERAL CFA stenosis; hypogastric artery narrowed at origin; BILATERAL iliac arteries with mild-moderate atherosclerosis (no high grade stenosis); 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 b.) punctate subpleural nodule in anterior LUL; new on 12/11/20 CT.   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 Cornelius Moras)   Varicose veins     Past Surgical History:  Procedure Laterality Date   CARDIAC CATHETERIZATION  03/26/2003   occluded RCA with collaterals from L to R, 60% mid Cfx disease, 40% branch disease to OM, LAD with 50% stenosis (Dr. Laurell Josephs)    CATARACT EXTRACTION W/PHACO Right 08/19/2018   Procedure: CATARACT EXTRACTION PHACO AND INTRAOCULAR LENS PLACEMENT (IOC);  Surgeon: Fabio Pierce, MD;  Location: [facility];  Service: Ophthalmology;  Laterality: Right;  CDE: 9.14   CATARACT EXTRACTION W/PHACO Left 09/27/2018   Procedure: CATARACT EXTRACTION PHACO AND INTRAOCULAR LENS PLACEMENT LEFT EYE  (CDE: 5.11);  Surgeon: Fabio Pierce, MD;  Location: [facility];  Service: Ophthalmology;  Laterality: Left;   CHOLECYSTECTOMY     COLONOSCOPY  2017   CORONARY ARTERY BYPASS GRAFT N/A 10/20/2012   Procedure: CORONARY ARTERY BYPASS GRAFTING (CABG);  Surgeon: Purcell Nails, MD;  Location: [facility] OR;  Service: Open Heart Surgery;  Laterality: N/A;   ENDOVASCULAR REPAIR/STENT GRAFT N/A 03/26/2021   Procedure: ENDOVASCULAR  REPAIR/STENT GRAFT;  Surgeon: Renford Dills, MD;  Location: [facility] INVASIVE CV LAB;  Service: Cardiovascular;  Laterality: N/A;   INTRAOPERATIVE TRANSESOPHAGEAL ECHOCARDIOGRAM N/A 10/20/2012   Procedure: INTRAOPERATIVE TRANSESOPHAGEAL ECHOCARDIOGRAM;  Surgeon: Purcell Nails, MD;  Location: [facility] OR;  Service: Open Heart Surgery;  Laterality: N/A;   LEFT HEART CATHETERIZATION WITH CORONARY ANGIOGRAM N/A 10/19/2012   Procedure: LEFT HEART CATHETERIZATION WITH CORONARY ANGIOGRAM;  Surgeon: Chrystie Nose, MD;  Location: [facility] CATH LAB;  Service: Cardiovascular;  Laterality: N/A;   SHOULDER SURGERY Right 2002     Social History   Tobacco Use   Smoking status: Former    Packs/day: 2.00    Types: Cigarettes    Quit date: 10/11/2012    Years since quitting: 9.5   Smokeless tobacco: Never  Vaping Use   Vaping Use: Never used  Substance Use Topics   Alcohol use: No   Drug use: No     Family History  Problem Relation Age of Onset   Coronary artery disease Mother      No Known Allergies   REVIEW OF SYSTEMS (Negative unless checked)  Constitutional: [] Weight loss  [] Fever  [] Chills Cardiac: [] Chest pain   [] Chest pressure   [] Palpitations   [] Shortness of breath when laying flat   [] Shortness of breath at rest   [x] Shortness of breath with exertion. Vascular:  [] Pain in legs with walking   [] Pain in legs at rest   [] Pain in legs when laying flat   [] Claudication   [] Pain in feet when walking  [] Pain in feet at rest  [] Pain in feet when laying flat   [] History of DVT   [] Phlebitis   [] Swelling in legs   [x] Varicose veins   [] Non-healing ulcers Pulmonary:   [] Uses home oxygen   [] Productive cough   [] Hemoptysis   [] Wheeze  [x] COPD   [] Asthma Neurologic:  [] Dizziness  [] Blackouts   [] Seizures   [] History of stroke   [] History of TIA  [] Aphasia   [] Temporary blindness   [] Dysphagia   [] Weakness or numbness in arms   [] Weakness or numbness in legs Musculoskeletal:  [x] Arthritis   [] Joint  swelling   [] Joint pain   [] Low back pain Hematologic:  [] Easy bruising  [] Easy bleeding   [] Hypercoagulable state   [] Anemic   Gastrointestinal:  [] Blood in stool   [] Vomiting blood  [] Gastroesophageal reflux/heartburn   [] Abdominal pain Genitourinary:  [] Chronic kidney disease   [] Difficult urination  [] Frequent urination  [] Burning with urination   [] Hematuria Skin:  [] Rashes   [] Ulcers   [] Wounds Psychological:  [] History of anxiety   []  History of major depression.  Physical Examination  BP 116/77 (BP Location: Right Arm)   Pulse 67   Resp 18   Ht 5\' 7"  (1.702 m)   Wt 208 lb 3.2 oz (94.4 kg)   BMI 32.61 kg/m  Gen:  WD/WN, NAD Head: [place]/AT, No temporalis wasting. Ear/Nose/Throat: Hearing grossly  intact, nares w/o erythema or drainage Eyes: Conjunctiva clear. Sclera non-icteric Neck: Supple.  Trachea midline Pulmonary:  Good air movement, no use of accessory muscles.  Cardiac: RRR, no JVD Vascular:  Vessel Right Left  Radial Palpable Palpable                                   Gastrointestinal: soft, non-tender/non-distended. No guarding/reflex. No increased aortic impulse Musculoskeletal: M/S 5/5 throughout.  No deformity or atrophy. No edema. Neurologic: Sensation grossly intact in extremities.  Symmetrical.  Speech is fluent.  Psychiatric: Judgment intact, Mood & affect appropriate for pt's clinical situation. Dermatologic: No rashes or ulcers noted.  No cellulitis or open wounds.      Labs No results found for this or any previous visit (from the past 2160 hour(s)).  Radiology No results found.  Assessment/Plan  HTN (hypertension) blood pressure control important in reducing the progression of atherosclerotic disease and aneurysmal growth. On appropriate oral medications.   Hyperlipidemia lipid control important in reducing the progression of atherosclerotic disease. Continue statin therapy   AAA (abdominal aortic aneurysm) without rupture  (HCC) Duplex shows a patent stent graft without endoleak and aneurysm sac size that has decreased down to just over 3 cm in maximal diameter.  We are 1 year out from surgery at this point.  We can go to an annual follow-up.  Continue current medical regimen.    Leotis Pain, MD  04/10/2022 10:14 AM    This note was created with Dragon medical transcription system.  Any errors from dictation are purely unintentional

## 2022-04-10 NOTE — Assessment & Plan Note (Signed)
blood pressure control important in reducing the progression of atherosclerotic disease and aneurysmal growth. On appropriate oral medications.  

## 2022-05-06 ENCOUNTER — Other Ambulatory Visit (INDEPENDENT_AMBULATORY_CARE_PROVIDER_SITE_OTHER): Payer: Self-pay

## 2022-05-06 MED ORDER — CLOPIDOGREL BISULFATE 75 MG PO TABS: 75.0000 mg | ORAL_TABLET | Freq: Every day | ORAL | 11 refills | Status: DC

## 2022-05-27 ENCOUNTER — Telehealth (INDEPENDENT_AMBULATORY_CARE_PROVIDER_SITE_OTHER): Payer: Self-pay | Admitting: Vascular Surgery

## 2022-05-27 NOTE — Telephone Encounter (Signed)
Spoke with pt's wife. She stated she will have pt call in regards to the referral from Dr. Karna Christmas.

## 2022-05-27 NOTE — Telephone Encounter (Signed)
Pt returned my call and stated that he did not know why Dr. Marland KitchenA" sent in a referral. States that he just saw Dr. Wyn Quaker and does not need a referral. States he does not have an abscess and is fine. I closed referral in [place]. Nothing further is needed at this time.

## 2023-04-08 ENCOUNTER — Other Ambulatory Visit (INDEPENDENT_AMBULATORY_CARE_PROVIDER_SITE_OTHER): Payer: Self-pay | Admitting: Vascular Surgery

## 2023-04-08 DIAGNOSIS — I714 Abdominal aortic aneurysm, without rupture, unspecified: Secondary | ICD-10-CM

## 2023-04-13 ENCOUNTER — Other Ambulatory Visit (INDEPENDENT_AMBULATORY_CARE_PROVIDER_SITE_OTHER): Payer: Medicare Other

## 2023-04-13 ENCOUNTER — Ambulatory Visit (INDEPENDENT_AMBULATORY_CARE_PROVIDER_SITE_OTHER): Payer: Medicare Other | Admitting: Vascular Surgery

## 2023-04-28 ENCOUNTER — Ambulatory Visit (INDEPENDENT_AMBULATORY_CARE_PROVIDER_SITE_OTHER): Payer: Medicare Other

## 2023-04-28 DIAGNOSIS — I714 Abdominal aortic aneurysm, without rupture, unspecified: Secondary | ICD-10-CM

## 2023-05-25 ENCOUNTER — Other Ambulatory Visit (INDEPENDENT_AMBULATORY_CARE_PROVIDER_SITE_OTHER): Payer: Self-pay | Admitting: Nurse Practitioner

## 2023-07-22 DIAGNOSIS — I10 Essential (primary) hypertension: Secondary | ICD-10-CM | POA: Diagnosis not present

## 2023-07-22 DIAGNOSIS — Z1389 Encounter for screening for other disorder: Secondary | ICD-10-CM | POA: Diagnosis not present

## 2023-07-22 DIAGNOSIS — J441 Chronic obstructive pulmonary disease with (acute) exacerbation: Secondary | ICD-10-CM | POA: Diagnosis not present

## 2023-07-22 DIAGNOSIS — Z0131 Encounter for examination of blood pressure with abnormal findings: Secondary | ICD-10-CM | POA: Diagnosis not present

## 2023-07-22 DIAGNOSIS — J45901 Unspecified asthma with (acute) exacerbation: Secondary | ICD-10-CM | POA: Diagnosis not present

## 2023-07-23 DIAGNOSIS — J45901 Unspecified asthma with (acute) exacerbation: Secondary | ICD-10-CM | POA: Diagnosis not present

## 2023-07-29 DIAGNOSIS — Z0131 Encounter for examination of blood pressure with abnormal findings: Secondary | ICD-10-CM | POA: Diagnosis not present

## 2023-07-29 DIAGNOSIS — J441 Chronic obstructive pulmonary disease with (acute) exacerbation: Secondary | ICD-10-CM | POA: Diagnosis not present

## 2023-07-29 DIAGNOSIS — E119 Type 2 diabetes mellitus without complications: Secondary | ICD-10-CM | POA: Diagnosis not present

## 2023-07-29 DIAGNOSIS — Z1389 Encounter for screening for other disorder: Secondary | ICD-10-CM | POA: Diagnosis not present

## 2023-07-29 DIAGNOSIS — Z0130 Encounter for examination of blood pressure without abnormal findings: Secondary | ICD-10-CM | POA: Diagnosis not present

## 2023-08-19 DIAGNOSIS — J441 Chronic obstructive pulmonary disease with (acute) exacerbation: Secondary | ICD-10-CM | POA: Diagnosis not present

## 2023-08-19 DIAGNOSIS — Z0130 Encounter for examination of blood pressure without abnormal findings: Secondary | ICD-10-CM | POA: Diagnosis not present

## 2023-08-19 DIAGNOSIS — J111 Influenza due to unidentified influenza virus with other respiratory manifestations: Secondary | ICD-10-CM | POA: Diagnosis not present

## 2023-08-19 DIAGNOSIS — Z1389 Encounter for screening for other disorder: Secondary | ICD-10-CM | POA: Diagnosis not present

## 2023-08-21 ENCOUNTER — Encounter (HOSPITAL_COMMUNITY): Payer: Self-pay

## 2023-08-21 ENCOUNTER — Emergency Department (HOSPITAL_COMMUNITY): Payer: Medicare Other

## 2023-08-21 ENCOUNTER — Other Ambulatory Visit: Payer: Self-pay

## 2023-08-21 ENCOUNTER — Inpatient Hospital Stay (HOSPITAL_COMMUNITY)
Admission: EM | Admit: 2023-08-21 | Discharge: 2023-08-24 | DRG: 193 | Disposition: A | Discharge status: Home / Self Care | Payer: Medicare Other | Attending: Internal Medicine | Admitting: Internal Medicine

## 2023-08-21 DIAGNOSIS — I252 Old myocardial infarction: Secondary | ICD-10-CM

## 2023-08-21 DIAGNOSIS — Z8249 Family history of ischemic heart disease and other diseases of the circulatory system: Secondary | ICD-10-CM | POA: Diagnosis not present

## 2023-08-21 DIAGNOSIS — I4891 Unspecified atrial fibrillation: Secondary | ICD-10-CM | POA: Insufficient documentation

## 2023-08-21 DIAGNOSIS — Z7982 Long term (current) use of aspirin: Secondary | ICD-10-CM | POA: Diagnosis not present

## 2023-08-21 DIAGNOSIS — I70203 Unspecified atherosclerosis of native arteries of extremities, bilateral legs: Secondary | ICD-10-CM | POA: Diagnosis present

## 2023-08-21 DIAGNOSIS — Z634 Disappearance and death of family member: Secondary | ICD-10-CM | POA: Diagnosis not present

## 2023-08-21 DIAGNOSIS — J9601 Acute respiratory failure with hypoxia: Secondary | ICD-10-CM | POA: Diagnosis not present

## 2023-08-21 DIAGNOSIS — Z7951 Long term (current) use of inhaled steroids: Secondary | ICD-10-CM | POA: Diagnosis not present

## 2023-08-21 DIAGNOSIS — Z951 Presence of aortocoronary bypass graft: Secondary | ICD-10-CM | POA: Diagnosis not present

## 2023-08-21 DIAGNOSIS — J440 Chronic obstructive pulmonary disease with acute lower respiratory infection: Secondary | ICD-10-CM | POA: Diagnosis not present

## 2023-08-21 DIAGNOSIS — I10 Essential (primary) hypertension: Secondary | ICD-10-CM | POA: Diagnosis not present

## 2023-08-21 DIAGNOSIS — J101 Influenza due to other identified influenza virus with other respiratory manifestations: Secondary | ICD-10-CM

## 2023-08-21 DIAGNOSIS — J9811 Atelectasis: Secondary | ICD-10-CM | POA: Diagnosis present

## 2023-08-21 DIAGNOSIS — J9622 Acute and chronic respiratory failure with hypercapnia: Secondary | ICD-10-CM | POA: Diagnosis present

## 2023-08-21 DIAGNOSIS — E876 Hypokalemia: Secondary | ICD-10-CM | POA: Diagnosis present

## 2023-08-21 DIAGNOSIS — I700 Atherosclerosis of aorta: Secondary | ICD-10-CM | POA: Diagnosis present

## 2023-08-21 DIAGNOSIS — Z87442 Personal history of urinary calculi: Secondary | ICD-10-CM

## 2023-08-21 DIAGNOSIS — E782 Mixed hyperlipidemia: Secondary | ICD-10-CM | POA: Diagnosis not present

## 2023-08-21 DIAGNOSIS — J1000 Influenza due to other identified influenza virus with unspecified type of pneumonia: Secondary | ICD-10-CM | POA: Diagnosis not present

## 2023-08-21 DIAGNOSIS — J9621 Acute and chronic respiratory failure with hypoxia: Secondary | ICD-10-CM | POA: Diagnosis not present

## 2023-08-21 DIAGNOSIS — J09X1 Influenza due to identified novel influenza A virus with pneumonia: Secondary | ICD-10-CM | POA: Diagnosis not present

## 2023-08-21 DIAGNOSIS — R062 Wheezing: Secondary | ICD-10-CM | POA: Diagnosis not present

## 2023-08-21 DIAGNOSIS — Z6832 Body mass index (BMI) 32.0-32.9, adult: Secondary | ICD-10-CM | POA: Diagnosis not present

## 2023-08-21 DIAGNOSIS — R0902 Hypoxemia: Secondary | ICD-10-CM | POA: Diagnosis not present

## 2023-08-21 DIAGNOSIS — R911 Solitary pulmonary nodule: Secondary | ICD-10-CM | POA: Diagnosis present

## 2023-08-21 DIAGNOSIS — E669 Obesity, unspecified: Secondary | ICD-10-CM | POA: Diagnosis not present

## 2023-08-21 DIAGNOSIS — Z79899 Other long term (current) drug therapy: Secondary | ICD-10-CM

## 2023-08-21 DIAGNOSIS — Z7952 Long term (current) use of systemic steroids: Secondary | ICD-10-CM

## 2023-08-21 DIAGNOSIS — Z87891 Personal history of nicotine dependence: Secondary | ICD-10-CM | POA: Diagnosis not present

## 2023-08-21 DIAGNOSIS — Z9981 Dependence on supplemental oxygen: Secondary | ICD-10-CM | POA: Diagnosis not present

## 2023-08-21 DIAGNOSIS — J441 Chronic obstructive pulmonary disease with (acute) exacerbation: Principal | ICD-10-CM | POA: Insufficient documentation

## 2023-08-21 DIAGNOSIS — I480 Paroxysmal atrial fibrillation: Secondary | ICD-10-CM | POA: Diagnosis present

## 2023-08-21 DIAGNOSIS — Z7902 Long term (current) use of antithrombotics/antiplatelets: Secondary | ICD-10-CM

## 2023-08-21 DIAGNOSIS — I2510 Atherosclerotic heart disease of native coronary artery without angina pectoris: Secondary | ICD-10-CM | POA: Diagnosis not present

## 2023-08-21 DIAGNOSIS — J449 Chronic obstructive pulmonary disease, unspecified: Secondary | ICD-10-CM | POA: Diagnosis present

## 2023-08-21 DIAGNOSIS — R0602 Shortness of breath: Secondary | ICD-10-CM | POA: Diagnosis not present

## 2023-08-21 DIAGNOSIS — J45901 Unspecified asthma with (acute) exacerbation: Secondary | ICD-10-CM | POA: Diagnosis not present

## 2023-08-21 DIAGNOSIS — E8729 Other acidosis: Secondary | ICD-10-CM | POA: Diagnosis present

## 2023-08-21 DIAGNOSIS — I493 Ventricular premature depolarization: Secondary | ICD-10-CM | POA: Diagnosis present

## 2023-08-21 DIAGNOSIS — E785 Hyperlipidemia, unspecified: Secondary | ICD-10-CM | POA: Diagnosis present

## 2023-08-21 DIAGNOSIS — I4510 Unspecified right bundle-branch block: Secondary | ICD-10-CM | POA: Diagnosis present

## 2023-08-21 DIAGNOSIS — F4321 Adjustment disorder with depressed mood: Secondary | ICD-10-CM

## 2023-08-21 DIAGNOSIS — Z8679 Personal history of other diseases of the circulatory system: Secondary | ICD-10-CM

## 2023-08-21 DIAGNOSIS — R000 Tachycardia, unspecified: Secondary | ICD-10-CM | POA: Diagnosis present

## 2023-08-21 LAB — CBC WITH DIFFERENTIAL/PLATELET
Abs Immature Granulocytes: 0.03 10*3/uL (ref 0.00–0.07)
Basophils Absolute: 0 10*3/uL (ref 0.0–0.1)
Basophils Relative: 0 %
Eosinophils Absolute: 0 10*3/uL (ref 0.0–0.5)
Eosinophils Relative: 0 %
HCT: 44.1 % (ref 39.0–52.0)
Hemoglobin: 14.1 g/dL (ref 13.0–17.0)
Immature Granulocytes: 0 %
Lymphocytes Relative: 6 %
Lymphs Abs: 0.5 10*3/uL — ABNORMAL LOW (ref 0.7–4.0)
MCH: 30.7 pg (ref 26.0–34.0)
MCHC: 32 g/dL (ref 30.0–36.0)
MCV: 95.9 fL (ref 80.0–100.0)
Monocytes Absolute: 0.5 10*3/uL (ref 0.1–1.0)
Monocytes Relative: 6 %
Neutro Abs: 7.5 10*3/uL (ref 1.7–7.7)
Neutrophils Relative %: 88 %
Platelets: 181 10*3/uL (ref 150–400)
RBC: 4.6 MIL/uL (ref 4.22–5.81)
RDW: 12.9 % (ref 11.5–15.5)
WBC: 8.6 10*3/uL (ref 4.0–10.5)
nRBC: 0 % (ref 0.0–0.2)

## 2023-08-21 LAB — BLOOD GAS, VENOUS
Acid-Base Excess: 17.1 mmol/L — ABNORMAL HIGH (ref 0.0–2.0)
Bicarbonate: 45.1 mmol/L — ABNORMAL HIGH (ref 20.0–28.0)
Drawn by: 7012
O2 Saturation: 77 %
Patient temperature: 36.4
pCO2, Ven: 66 mm[Hg] — ABNORMAL HIGH (ref 44–60)
pH, Ven: 7.44 — ABNORMAL HIGH (ref 7.25–7.43)
pO2, Ven: 37 mm[Hg] (ref 32–45)

## 2023-08-21 LAB — BASIC METABOLIC PANEL
Anion gap: 9 (ref 5–15)
BUN: 18 mg/dL (ref 8–23)
CO2: 39 mmol/L — ABNORMAL HIGH (ref 22–32)
Calcium: 8.7 mg/dL — ABNORMAL LOW (ref 8.9–10.3)
Chloride: 85 mmol/L — ABNORMAL LOW (ref 98–111)
Creatinine, Ser: 0.86 mg/dL (ref 0.61–1.24)
GFR, Estimated: >60 mL/min (ref >60)
Glucose, Bld: 133 mg/dL — ABNORMAL HIGH (ref 70–99)
Potassium: 3.4 mmol/L — ABNORMAL LOW (ref 3.5–5.1)
Sodium: 133 mmol/L — ABNORMAL LOW (ref 135–145)

## 2023-08-21 LAB — BRAIN NATRIURETIC PEPTIDE: B Natriuretic Peptide: 114 pg/mL — ABNORMAL HIGH (ref 0.0–100.0)

## 2023-08-21 LAB — MRSA NEXT GEN BY PCR, NASAL: MRSA by PCR Next Gen: NOT DETECTED

## 2023-08-21 MED ORDER — UMECLIDINIUM BROMIDE 62.5 MCG/ACT IN AEPB
1.0000 | INHALATION_SPRAY | Freq: Every day | RESPIRATORY_TRACT | Status: DC
  Administered 2023-08-22: 1 via RESPIRATORY_TRACT
  Filled 2023-08-21: qty 7

## 2023-08-21 MED ORDER — OSELTAMIVIR PHOSPHATE 75 MG PO CAPS
75.0000 mg | ORAL_CAPSULE | Freq: Two times a day (BID) | ORAL | Status: DC
  Administered 2023-08-21 – 2023-08-24 (×6): 75 mg via ORAL
  Filled 2023-08-21 (×6): qty 1

## 2023-08-21 MED ORDER — ONDANSETRON HCL 4 MG PO TABS: 4.0000 mg | ORAL_TABLET | Freq: Four times a day (QID) | ORAL | Status: DC | PRN

## 2023-08-21 MED ORDER — ALUM & MAG HYDROXIDE-SIMETH 200-200-20 MG/5ML PO SUSP
15.0000 mL | ORAL | Status: DC | PRN
  Administered 2023-08-21: 15 mL via ORAL
  Filled 2023-08-21: qty 30

## 2023-08-21 MED ORDER — POTASSIUM CHLORIDE CRYS ER 20 MEQ PO TBCR
40.0000 meq | EXTENDED_RELEASE_TABLET | Freq: Two times a day (BID) | ORAL | Status: AC
  Administered 2023-08-21 – 2023-08-22 (×2): 40 meq via ORAL
  Filled 2023-08-21 (×2): qty 2

## 2023-08-21 MED ORDER — METHYLPREDNISOLONE SODIUM SUCC 125 MG IJ SOLR
125.0000 mg | Freq: Once | INTRAMUSCULAR | Status: AC
  Administered 2023-08-21: 125 mg via INTRAVENOUS
  Filled 2023-08-21: qty 2

## 2023-08-21 MED ORDER — CHLORHEXIDINE GLUCONATE CLOTH 2 % EX PADS
6.0000 | MEDICATED_PAD | Freq: Every day | CUTANEOUS | Status: DC
  Administered 2023-08-22 – 2023-08-24 (×3): 6 via TOPICAL

## 2023-08-21 MED ORDER — FENTANYL CITRATE PF 50 MCG/ML IJ SOSY
PREFILLED_SYRINGE | INTRAMUSCULAR | Status: AC
  Administered 2023-08-21: 50 ug via INTRAVENOUS
  Filled 2023-08-21: qty 1

## 2023-08-21 MED ORDER — METHYLPREDNISOLONE SODIUM SUCC 125 MG IJ SOLR
60.0000 mg | Freq: Two times a day (BID) | INTRAMUSCULAR | Status: DC
  Administered 2023-08-21 – 2023-08-24 (×6): 60 mg via INTRAVENOUS
  Filled 2023-08-21 (×6): qty 2

## 2023-08-21 MED ORDER — AMLODIPINE BESYLATE 5 MG PO TABS: 2.5000 mg | ORAL_TABLET | Freq: Every day | ORAL | Status: DC

## 2023-08-21 MED ORDER — ONDANSETRON HCL 4 MG/2ML IJ SOLN: 4.0000 mg | Freq: Four times a day (QID) | INTRAMUSCULAR | Status: DC | PRN

## 2023-08-21 MED ORDER — FLUTICASONE FUROATE-VILANTEROL 200-25 MCG/ACT IN AEPB
1.0000 | INHALATION_SPRAY | Freq: Every day | RESPIRATORY_TRACT | Status: DC
  Administered 2023-08-22: 1 via RESPIRATORY_TRACT
  Filled 2023-08-21: qty 28

## 2023-08-21 MED ORDER — DILTIAZEM HCL-DEXTROSE 125-5 MG/125ML-% IV SOLN (PREMIX)
5.0000 mg/h | INTRAVENOUS | Status: DC
  Administered 2023-08-21: 5 mg/h via INTRAVENOUS
  Filled 2023-08-21: qty 125

## 2023-08-21 MED ORDER — HYDROCHLOROTHIAZIDE 12.5 MG PO TABS
12.5000 mg | ORAL_TABLET | Freq: Every day | ORAL | Status: DC
  Administered 2023-08-22: 12.5 mg via ORAL
  Filled 2023-08-21: qty 1

## 2023-08-21 MED ORDER — GUAIFENESIN-CODEINE 100-10 MG/5ML PO SOLN
5.0000 mL | Freq: Four times a day (QID) | ORAL | Status: DC | PRN
  Administered 2023-08-23: 5 mL via ORAL
  Filled 2023-08-21: qty 5

## 2023-08-21 MED ORDER — LEVALBUTEROL HCL 0.63 MG/3ML IN NEBU: 0.6300 mg | INHALATION_SOLUTION | Freq: Four times a day (QID) | RESPIRATORY_TRACT | Status: DC | PRN

## 2023-08-21 MED ORDER — IPRATROPIUM-ALBUTEROL 0.5-2.5 (3) MG/3ML IN SOLN
3.0000 mL | RESPIRATORY_TRACT | Status: AC
  Administered 2023-08-21 (×3): 3 mL via RESPIRATORY_TRACT
  Filled 2023-08-21 (×2): qty 3

## 2023-08-21 MED ORDER — ATORVASTATIN CALCIUM 40 MG PO TABS
40.0000 mg | ORAL_TABLET | Freq: Every day | ORAL | Status: DC
  Administered 2023-08-21 – 2023-08-24 (×4): 40 mg via ORAL
  Filled 2023-08-21 (×4): qty 1

## 2023-08-21 MED ORDER — LISINOPRIL 10 MG PO TABS
10.0000 mg | ORAL_TABLET | Freq: Every day | ORAL | Status: DC
  Administered 2023-08-22: 10 mg via ORAL
  Filled 2023-08-21: qty 1

## 2023-08-21 MED ORDER — LISINOPRIL-HYDROCHLOROTHIAZIDE 10-12.5 MG PO TABS: 1.0000 | ORAL_TABLET | Freq: Every day | ORAL | Status: DC

## 2023-08-21 MED ORDER — FENTANYL CITRATE PF 50 MCG/ML IJ SOSY: 50.0000 ug | PREFILLED_SYRINGE | Freq: Once | INTRAMUSCULAR | Status: AC

## 2023-08-21 MED ORDER — APIXABAN 5 MG PO TABS
5.0000 mg | ORAL_TABLET | Freq: Two times a day (BID) | ORAL | Status: DC
  Administered 2023-08-21 – 2023-08-24 (×6): 5 mg via ORAL
  Filled 2023-08-21 (×6): qty 1

## 2023-08-21 MED ORDER — PREDNISONE 20 MG PO TABS: 20.0000 mg | ORAL_TABLET | Freq: Two times a day (BID) | ORAL | Status: DC

## 2023-08-21 MED ORDER — DILTIAZEM HCL 60 MG PO TABS
60.0000 mg | ORAL_TABLET | Freq: Four times a day (QID) | ORAL | Status: DC
  Administered 2023-08-21 – 2023-08-22 (×3): 60 mg via ORAL
  Filled 2023-08-21 (×3): qty 1

## 2023-08-21 MED ORDER — CLOPIDOGREL BISULFATE 75 MG PO TABS
75.0000 mg | ORAL_TABLET | Freq: Every day | ORAL | Status: DC
  Administered 2023-08-22 – 2023-08-24 (×3): 75 mg via ORAL
  Filled 2023-08-21 (×3): qty 1

## 2023-08-21 MED ORDER — GUAIFENESIN ER 600 MG PO TB12
600.0000 mg | ORAL_TABLET | Freq: Two times a day (BID) | ORAL | Status: DC
  Administered 2023-08-21 – 2023-08-24 (×6): 600 mg via ORAL
  Filled 2023-08-21 (×6): qty 1

## 2023-08-21 MED ORDER — DILTIAZEM LOAD VIA INFUSION
20.0000 mg | Freq: Once | INTRAVENOUS | Status: AC
  Administered 2023-08-21: 20 mg via INTRAVENOUS
  Filled 2023-08-21: qty 20

## 2023-08-21 MED ORDER — METOPROLOL TARTRATE 5 MG/5ML IV SOLN
5.0000 mg | INTRAVENOUS | Status: DC | PRN
  Administered 2023-08-21: 5 mg via INTRAVENOUS
  Filled 2023-08-21 (×2): qty 5

## 2023-08-21 MED ORDER — ALBUTEROL SULFATE HFA 108 (90 BASE) MCG/ACT IN AERS: 2.0000 | INHALATION_SPRAY | RESPIRATORY_TRACT | Status: DC | PRN

## 2023-08-21 MED ORDER — TIOTROPIUM BROMIDE MONOHYDRATE 18 MCG IN CAPS: 18.0000 ug | ORAL_CAPSULE | Freq: Every day | RESPIRATORY_TRACT | Status: DC

## 2023-08-21 NOTE — ED Provider Notes (Signed)
 [place] [facility] AT [facility] Provider Note   CSN: [id number] Arrival date & time: 08/21/23  1046     History  Chief Complaint  Patient presents with   Shortness of Breath   Influenza    [name] is a 78 y.o. male with COPD on 2L [place] PRN or at night at home who presents with SOB/difficulty breathing. Pt states he was diagnosed with flu A on Thursday. Oxygen dropped to 88% on 3 liters when ambulating to ambulance. Pt now on 4 liters nasal cannula and sat 90%. Usually wears 2L [place] at night for his COPD. Started using it more recently PRN during the day while taking care of his wife, who passed away last week. Her funeral last Saturday. Has had problems with breathing for the last 6-8 months. Endorses cough productive of clear sputum since Thursday. Denies f/c. Does endorse some CP with coughing d/t persistent coughing. No nausea/vomiting. Endorses some leg swelling that is new, but thinks that's from sitting at table with family in town with his legs down.    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   Coronary artery disease    Dyslipidemia 10/18/2012   History of kidney stones    Hypertension    Incomplete right bundle branch block (RBBB)    Myocardial infarction [facility])    PAD (peripheral artery disease) (HCC)    a.) CTA on 01/03/2021 --> mild BILATERAL CFA stenosis; hypogastric artery narrowed at origin; BILATERAL iliac arteries with mild-moderate atherosclerosis (no high grade stenosis); BILATERAL (L>R) renal artery stenosis (narrowing on LEFT secondary to atherosclerotic plaque and mass effect from superior aspect of infrarenal aneurysm sac); mild SMA atherosclerosis    Pulmonary nodule    a.) 6 mm RML nodule; stable. b.) punctate subpleural nodule in anterior LUL; new on 12/11/20 CT.   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 Dusty)   Varicose veins        Home Medications Prior to Admission medications   Medication Sig Start Date End Date Taking? Authorizing Provider  ADVAIR HFA 115-21 MCG/ACT inhaler Inhale 2 puffs into the lungs 2 (two) times daily. 07/02/23  Yes [provider]  lisinopril -hydrochlorothiazide  (ZESTORETIC ) 10-12.5 MG tablet Take 1 tablet by mouth daily. 06/21/23  Yes [provider]  oseltamivir  (TAMIFLU ) 75 MG capsule Take 75 mg by mouth 2 (two) times daily. 08/19/23  Yes [provider]  predniSONE  (DELTASONE ) 20 MG tablet Take 20 mg by mouth 2 (two) times daily. 08/19/23  Yes [provider]  albuterol  (VENTOLIN  HFA) 108 (90 Base) MCG/ACT inhaler Inhale 1-2 puffs into the lungs every 6 (six) hours as needed for wheezing or shortness of breath.    [provider]  amLODipine  (NORVASC ) 2.5 MG tablet Take 1 tablet (2.5 mg total) by mouth daily. 04/10/21   Mona Vinie BROCKS, MD  aspirin  EC 81 MG tablet Take 81 mg by mouth daily. Swallow whole. Patient not taking: Reported on 04/10/2022    [provider]  atorvastatin  (LIPITOR) 40 MG tablet Take 40 mg by mouth daily.    [provider]  Cholecalciferol (VITAMIN D) 125 MCG (5000 UT) CAPS Take by mouth.    [provider]  clopidogrel  (PLAVIX ) 75 MG tablet Take 1 tablet (75 mg total) by mouth daily at 6 (six) AM. 05/25/23   Brown, Fallon E, NP  Fluticasone -Umeclidin-Vilant (TRELEGY ELLIPTA) 100-62.5-25 MCG/INH AEPB Inhale 1 puff into the lungs daily.    [provider]  guaiFENesin -codeine  100-10 MG/5ML syrup Take 5 mLs by mouth every 6 (six) hours as needed for cough. 08/19/23   [provider]  ibuprofen (ADVIL) 200 MG tablet Take 400-600 mg by mouth every 6  (six) hours as needed for mild pain or moderate pain.    [provider]  metoprolol  succinate (TOPROL -XL) 25 MG 24 hr tablet Take 25 mg by mouth daily.    [provider]  quinapril -hydrochlorothiazide  (ACCURETIC ) 10-12.5 MG tablet Take 1 tablet by mouth daily. 03/08/20   Hilty, Vinie BROCKS, MD  SPIRIVA  HANDIHALER 18 MCG inhalation capsule Place 18 mcg into inhaler and inhale daily. 07/22/23   [provider]      Allergies    Patient has no known allergies.    Review of Systems   Review of Systems A 10 point review of systems was performed and is negative unless otherwise reported in HPI.  Physical Exam Updated Vital Signs BP 119/71   Pulse (!) 128   Temp 97.6 F (36.4 C) (Oral)   Resp (!) 25   Ht 5' 7 (1.702 m)   Wt 93 kg   SpO2 (!) 88%   BMI 32.11 kg/m  Physical Exam General: Normal appearing elderly male, lying in bed. HEENT: PERRLA, Sclera anicteric, MMM, trachea midline.  Cardiology: RRR, no murmurs/rubs/gallops.  Resp: Mildly increased respiratory rate and effort. Diffuse expiratory wheezing and rhonchi.  Breathing before the end of the sentence while talking. Satting 92% on 4L [place].  Abd: Soft, non-tender, non-distended. No rebound tenderness or guarding.  GU: Deferred. MSK: No peripheral edema or signs of trauma. Extremities without deformity or TTP. No cyanosis or clubbing. Skin: warm, dry. Neuro: A&Ox4, CNs II-XII grossly intact. MAEs. Sensation grossly intact.  Psych:  Extremely pleasant and friendly. Intermittently tearful over the loss of his wife.   ED Results / Procedures / Treatments   Labs (all labs ordered are listed, but only abnormal results are displayed) Labs Reviewed  CBC WITH DIFFERENTIAL/PLATELET - Abnormal; Notable for the following components:      Result Value   Lymphs Abs 0.5 (*)    All other components within normal limits  BRAIN NATRIURETIC PEPTIDE - Abnormal; Notable for the following components:   B Natriuretic  Peptide 114.0 (*)    All other components within normal limits  BASIC METABOLIC PANEL - Abnormal; Notable for the following components:   Sodium 133 (*)    Potassium 3.4 (*)    Chloride 85 (*)    CO2 39 (*)    Glucose, Bld 133 (*)    Calcium  8.7 (*)    All other components within normal limits  BLOOD GAS, VENOUS - Abnormal; Notable for the following components:   pH, Ven 7.44 (*)    pCO2, Ven 66 (*)    Bicarbonate 45.1 (*)    Acid-Base Excess 17.1 (*)    All other components within normal limits    EKG EKG Interpretation Date/Time:  Saturday August 21 2023 11:32:38 EST Ventricular Rate:  82 PR Interval:  126 QRS Duration:  105 QT Interval:  347 QTC Calculation: 406 R Axis:  118  Text Interpretation: Sinus rhythm Left posterior fascicular block Anterior infarct, old Confirmed by Franklyn Gills [phone]) on 08/21/2023 2:52:19 PM  Radiology DG Chest 2 View Result Date: 08/21/2023 CLINICAL DATA:  Shortness of breath. EXAM: CHEST - 2 VIEW COMPARISON:  11/14/2012 FINDINGS: The lungs are clear without focal pneumonia, edema, pneumothorax or pleural effusion. Streaky opacity at the left base suggest atelectasis or scarring. Interstitial markings are diffusely coarsened with chronic features. The cardiopericardial silhouette is within normal limits for size. No acute bony abnormality. Telemetry leads overlie the chest. IMPRESSION: Streaky opacity at the left base suggests atelectasis or scarring. Electronically Signed   By: Camellia Candle M.D.   On: 08/21/2023 12:38    Procedures .Cardioversion  Date/Time: 08/21/2023 3:26 PM  Performed by: Franklyn Gills SAILOR, MD Authorized by: Franklyn Gills SAILOR, MD   Consent:    Consent obtained:  Verbal   Risks discussed:  Cutaneous burn, death, induced arrhythmia and pain   Alternatives discussed:  No treatment and rate-control medication Pre-procedure details:    Cardioversion basis:  Emergent   Rhythm:  Atrial fibrillation   Electrode placement:   Anterior-posterior Patient sedated: No Attempt one:    Cardioversion mode:  Synchronous   Waveform:  Biphasic   Shock (Joules):  120   Cardioversion outcome attempt one: Converstion to sinus tachycardia with frequent PVCs. Post-procedure details:    Patient status:  Awake   Patient tolerance of procedure:  Tolerated well, no immediate complications .Critical Care  Performed by: Franklyn Gills SAILOR, MD Authorized by: Franklyn Gills SAILOR, MD   Critical care provider statement:    Critical care time (minutes):  45   Critical care was necessary to treat or prevent imminent or life-threatening deterioration of the following conditions:  Respiratory failure and cardiac failure   Critical care was time spent personally by me on the following activities:  Development of treatment plan with patient or surrogate, discussions with consultants, evaluation of patient's response to treatment, examination of patient, ordering and review of laboratory studies, ordering and review of radiographic studies, ordering and performing treatments and interventions, pulse oximetry, re-evaluation of patient's condition, review of old charts and obtaining history from patient or surrogate   Care discussed with: admitting provider       Medications Ordered in ED Medications  metoprolol  tartrate (LOPRESSOR ) injection 5 mg (5 mg Intravenous Given 08/21/23 1510)  ipratropium-albuterol  (DUONEB) 0.5-2.5 (3) MG/3ML nebulizer solution 3 mL (3 mLs Nebulization Given 08/21/23 1417)  methylPREDNISolone  sodium succinate (SOLU-MEDROL ) 125 mg/2 mL injection 125 mg (125 mg Intravenous Given 08/21/23 1233)  fentaNYL  (SUBLIMAZE ) injection 50 mcg (50 mcg Intravenous Given 08/21/23 1500)    ED Course/ Medical Decision Making/ A&P                          Medical Decision Making Amount and/or Complexity of Data Reviewed Labs: ordered. Decision-making details documented in ED Course. Radiology: ordered.  Risk Prescription drug  management. Decision regarding hospitalization.    This patient presents to the ED for concern of dyspnea, SOB, this involves an extensive number of treatment options, and is a complaint that carries with it a high risk of complications and morbidity.  I considered the following differential and admission for this acute, potentially life threatening condition.   MDM:    DDX for dyspnea includes but is not limited to:  Patient presents with dyspnea and expiratory wheezing with a history of COPD on chronic oxygen therapy.  Has recently had increased oxygen at home from 2 L as needed and only at night to wearing it much more frequently and often increasing to 3 L. Consider viral illness and ultimately found to be influenza A positive as well.  He denies any chest pain to indicate ACS and his EKG does not demonstrate any signs of ischemia on presentation. Lower c/f CHF as not clinically volume overloaded, no significant pulm edema on CXR but BNP is mildly elevated. Lower c/f also for PE.     Clinical Course as of 08/21/23 1545  Sat Aug 21, 2023  1342 CO2(!): 39 Increased CO2, likely chronically or subacutely worsening resp acidosis, will get VBG [HN]  1342 B Natriuretic Peptide(!): 114.0 MIldly elevated [HN]  1456 Patient developed Afib with RVR. He has chest pain associated with this. No h/o similar. BP 123/73. Discussed with patient management options and performed shared decision making, patient would prefer to have cardioversion. He has no h/o Afib and it started while here in ED, prior EKG shows NSR, so within window for cardioversion. He is consented with the risks. Offered sedation but patient declines. Patient is aware it will be painful. Patient will be given 50 mcg fentanyl  and will perform cardioversion.  [HN]  1506 Cardioversion successful, converted to sinus tachycardia with frequent PVCs, then after approx 3 minutes, went back into Afib w/ RVR.  [HN]  1520 Pt hypoxic on 6L [place]. But he  is mostly breathing through his mouth and states his breathing actually feels good. Respiratory will get venturi mask so he can breathe oxygen through his mouth [HN]  1541 ECG Heart Rate(!): 110 HR improving with metoprolol  [HN]  1542 Admitted to Dr. Barbra hospitalist [HN]    Clinical Course User Index [HN] Franklyn Sid SAILOR, MD    Labs: I Ordered, and personally interpreted labs.  The pertinent results include:  those listed above  Imaging Studies ordered: I ordered imaging studies including CXR I independently visualized and interpreted imaging. I agree with the radiologist interpretation  Additional history obtained from chart review, daughter at bedside.    Cardiac Monitoring: The patient was maintained on a cardiac monitor.  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   Coronary artery disease    Dyslipidemia 10/18/2012   History of kidney stones    Hypertension    Incomplete right bundle branch block (RBBB)    Myocardial infarction [facility])    PAD (peripheral artery disease) (HCC)    a.) CTA on 01/03/2021 --> mild BILATERAL CFA stenosis; hypogastric artery narrowed at origin; BILATERAL iliac arteries with mild-moderate atherosclerosis (no high grade stenosis); BILATERAL (L>R) renal artery  stenosis (narrowing on LEFT secondary to atherosclerotic plaque and mass effect from superior aspect of infrarenal aneurysm sac); mild SMA atherosclerosis   Pulmonary nodule    a.) 6 mm RML nodule;  stable. b.) punctate subpleural nodule in anterior LUL; new on 12/11/20 CT.   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 Dusty)   Varicose veins      Medicines Meds ordered this encounter  Medications   DISCONTD: albuterol  (VENTOLIN  HFA) 108 (90 Base) MCG/ACT inhaler 2 puff   ipratropium-albuterol  (DUONEB) 0.5-2.5 (3) MG/3ML nebulizer solution 3 mL   methylPREDNISolone  sodium succinate (SOLU-MEDROL ) 125 mg/2 mL injection 125 mg   fentaNYL  (SUBLIMAZE ) 50 MCG/ML injection    Kerkstoel, Susan G: cabinet override   metoprolol  tartrate (LOPRESSOR ) injection 5 mg   fentaNYL  (SUBLIMAZE ) injection 50 mcg    I have reviewed the patients home medicines and have made adjustments as needed  Problem List / ED Course: Problem List Items Addressed This Visit   None Visit Diagnoses       COPD exacerbation (HCC)    -  Primary   Relevant Medications   ipratropium-albuterol  (DUONEB) 0.5-2.5 (3) MG/3ML nebulizer solution 3 mL (Completed)   methylPREDNISolone  sodium succinate (SOLU-MEDROL ) 125 mg/2 mL injection 125 mg (Completed)   ADVAIR HFA 115-21 MCG/ACT inhaler   guaiFENesin -codeine  100-10 MG/5ML syrup   predniSONE  (DELTASONE ) 20 MG tablet   SPIRIVA  HANDIHALER 18 MCG inhalation capsule     Influenza A       Relevant Medications   oseltamivir  (TAMIFLU ) 75 MG capsule     Atrial fibrillation with rapid ventricular response (HCC)       Relevant Medications   lisinopril -hydrochlorothiazide  (ZESTORETIC ) 10-12.5 MG tablet   metoprolol  tartrate (LOPRESSOR ) injection 5 mg     Grief                       This note was created using dictation software, which may contain spelling or grammatical errors.    Franklyn Sid SAILOR, MD 08/25/23 [phone]

## 2023-08-21 NOTE — Progress Notes (Signed)
 Patient converted to NSR with rate of 70s. Will transition to Cardizem  IR 60mg  Q6hr. Overlap with drip for 1 hour.

## 2023-08-21 NOTE — ED Notes (Signed)
Pt transported to xr 

## 2023-08-21 NOTE — ED Triage Notes (Signed)
 Pt arrived via EMS due to difficulty breathing. Pt states he was diagnosed with flu on Thursday. Oxygen dropped to 88% on 3 liters when ambulating to ambulance. Pt now on 4 liters nasal cannula and sat 90%. Wife's funeral last Saturday.

## 2023-08-21 NOTE — Progress Notes (Signed)
 Patient noted to convert to NSR with rates in the 70s. Patient continues on cardizem  gtt at 5mg /hr. EKG completed to confirm NSR. Dr. Cathyann Cobia made aware.

## 2023-08-21 NOTE — H&P (Signed)
 History and Physical    Patient: [name]:[id number] DOB: 1945/10/05 DOA: 08/21/2023 DOS: the patient was seen and examined on 08/21/2023 PCP: Buren Rock HERO, MD  Patient coming from: Home  Chief Complaint:  Chief Complaint  Patient presents with   Shortness of Breath   Influenza   HPI: [name] is a 78 y.o. male with medical history significant of COPD on intermittent use of oxygen, coronary artery disease, hypertension, history of MI, AAA, peripheral artery disease status post stenting.  Patient recently lost his wife and had a funeral about a week ago.  He then developed COPD exacerbation and was diagnosed with pneumonia.  He started on Tamiflu  and steroids 2 days ago, however he became more short of breath over the last 24 hours.  He came to the hospital and was found to be hypoxic with oxygen saturations in the 80s.  He was placed on 3 L nasal cannula, and then transition to nonrebreather because he was mouth breathing.  He received several rounds of albuterol , then he went into A-fib with RVR.cardioversion was attempted, which briefly put him in sinus rhythm, but he transition back to A-fib with RVR.  He got 1 dose of IV metoprolol  which helped a little, but he continued to have a rapid ventricular rate.  I was called to admit him due to the shortness of breath and A-fib.  He has no documented history of A-fib and is not on any anticoagulants.  Review of Systems: As mentioned in the history of present illness. All other systems reviewed and are negative. 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   Coronary artery disease    Dyslipidemia 10/18/2012   History of kidney stones    Hypertension     Incomplete right bundle branch block (RBBB)    Myocardial infarction [facility])    PAD (peripheral artery disease) (HCC)    a.) CTA on 01/03/2021 --> mild BILATERAL CFA stenosis; hypogastric artery narrowed at origin; BILATERAL iliac arteries with mild-moderate atherosclerosis (no high grade stenosis); 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 b.) punctate subpleural nodule in anterior LUL; new on 12/11/20 CT.   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 Dusty)   Varicose veins    Past Surgical History:  Procedure Laterality Date   CARDIAC CATHETERIZATION  03/26/2003   occluded RCA with collaterals from L to R, 60% mid Cfx disease, 40% branch disease to OM, LAD with 50% stenosis (Dr. FABIENE Hasten)    CATARACT EXTRACTION W/PHACO Right 08/19/2018   Procedure: CATARACT EXTRACTION PHACO AND INTRAOCULAR LENS PLACEMENT (IOC);  Surgeon: Harrie Agent, MD;  Location: [facility] ORS;  Service: Ophthalmology;  Laterality: Right;  CDE: 9.14   CATARACT EXTRACTION W/PHACO Left 09/27/2018   Procedure: CATARACT EXTRACTION PHACO AND INTRAOCULAR LENS PLACEMENT LEFT EYE  (CDE: 5.11);  Surgeon: Harrie Agent, MD;  Location: [facility] ORS;  Service: Ophthalmology;  Laterality: Left;   CHOLECYSTECTOMY  COLONOSCOPY  2017   CORONARY ARTERY BYPASS GRAFT N/A 10/20/2012   Procedure: CORONARY ARTERY BYPASS GRAFTING (CABG);  Surgeon: Sudie VEAR Laine, MD;  Location: [facility];  Service: Open Heart Surgery;  Laterality: N/A;   ENDOVASCULAR REPAIR/STENT GRAFT N/A 03/26/2021   Procedure: ENDOVASCULAR REPAIR/STENT GRAFT;  Surgeon: Jama Cordella MATSU, MD;  Location: [facility] INVASIVE CV LAB;  Service: Cardiovascular;  Laterality: N/A;   INTRAOPERATIVE TRANSESOPHAGEAL ECHOCARDIOGRAM N/A 10/20/2012   Procedure:  INTRAOPERATIVE TRANSESOPHAGEAL ECHOCARDIOGRAM;  Surgeon: Sudie VEAR Laine, MD;  Location[facility];  Service: Open Heart Surgery;  Laterality: N/A;   LEFT HEART CATHETERIZATION WITH CORONARY ANGIOGRAM N/A 10/19/2012   Procedure: LEFT HEART CATHETERIZATION WITH CORONARY ANGIOGRAM;  Surgeon: Vinie KYM Maxcy, MD;  Location: [facility] CATH LAB;  Service: Cardiovascular;  Laterality: N/A;   SHOULDER SURGERY Right 2002   Social History:  reports that he quit smoking about 10 years ago. His smoking use included cigarettes. He has never used smokeless tobacco. He reports that he does not drink alcohol and does not use drugs.  No Known Allergies  Family History  Problem Relation Age of Onset   Coronary artery disease Mother     Prior to Admission medications   Medication Sig Start Date End Date Taking? Authorizing Provider  ADVAIR HFA 115-21 MCG/ACT inhaler Inhale 2 puffs into the lungs 2 (two) times daily. 07/02/23  Yes [provider]  albuterol  (VENTOLIN  HFA) 108 (90 Base) MCG/ACT inhaler Inhale 1-2 puffs into the lungs every 6 (six) hours as needed for wheezing or shortness of breath.   Yes [provider]  amLODipine  (NORVASC ) 2.5 MG tablet Take 1 tablet (2.5 mg total) by mouth daily. 04/10/21  Yes Hilty, Vinie BROCKS, MD  atorvastatin  (LIPITOR) 40 MG tablet Take 40 mg by mouth daily.   Yes [provider]  Cholecalciferol (VITAMIN D) 125 MCG (5000 UT) CAPS Take by mouth.   Yes [provider]  clopidogrel  (PLAVIX ) 75 MG tablet Take 1 tablet (75 mg total) by mouth daily at 6 (six) AM. 05/25/23  Yes Brown, Fallon E, NP  guaiFENesin -codeine  100-10 MG/5ML syrup Take 5 mLs by mouth every 6 (six) hours as needed for cough. 08/19/23  Yes [provider]  ibuprofen (ADVIL) 200 MG tablet Take 400-600 mg by mouth every 6 (six) hours as needed for mild pain or moderate pain.   Yes [provider]  lisinopril -hydrochlorothiazide  (ZESTORETIC ) 10-12.5 MG tablet Take 1  tablet by mouth daily. 06/21/23  Yes [provider]  metoprolol  succinate (TOPROL -XL) 25 MG 24 hr tablet Take 25 mg by mouth daily.   Yes [provider]  Multiple Vitamins-Minerals (PRESERVISION AREDS 2) CAPS Take 1 capsule by mouth in the morning and at bedtime.   Yes [provider]  oseltamivir  (TAMIFLU ) 75 MG capsule Take 75 mg by mouth 2 (two) times daily. 08/19/23  Yes [provider]  predniSONE  (DELTASONE ) 20 MG tablet Take 20 mg by mouth 2 (two) times daily. 08/19/23  Yes [provider]  SPIRIVA  HANDIHALER 18 MCG inhalation capsule Place 18 mcg into inhaler and inhale daily. 07/22/23  Yes [provider]    Physical Exam: Vitals:   08/21/23 1505 08/21/23 1515 08/21/23 1530 08/21/23 1545  BP: 131/74 119/71 117/71 120/77  Pulse: (!) 121 (!) 128 (!) 114 (!) 119  Resp: (!) 27 (!) 25 (!) 31 19  Temp:    97.9 F (36.6 C)  TempSrc:    Oral  SpO2: 91% (!) 88% 91% 90%  Weight:      Height:       General: Elderly male. Awake and alert and oriented x3. No acute cardiopulmonary distress.  HEENT: Normocephalic atraumatic.  Right and left ears normal in appearance.  Pupils equal, round, reactive to light. Extraocular muscles are intact. Sclerae anicteric and noninjected.  Moist mucosal membranes. No mucosal lesions.  Neck: Neck supple without lymphadenopathy. No carotid bruits. No masses palpated.  Cardiovascular: Irregularly irregular rate and rhythm. No murmurs, rubs, gallops auscultated. No JVD.  Respiratory: Good respiratory effort with no wheezes, rales, rhonchi. Lungs clear to auscultation bilaterally.  No accessory muscle use. Abdomen: Soft, nontender, nondistended. Active bowel sounds. No masses or hepatosplenomegaly  Skin: No rashes, lesions, or ulcerations.  Dry, warm to touch. 2+ dorsalis pedis and radial pulses. Musculoskeletal: No calf or leg pain. All major joints not erythematous nontender.  No upper or lower joint deformation.   Good ROM.  No contractures  Psychiatric: Intact judgment and insight. Pleasant and cooperative. Neurologic: No focal neurological deficits. Strength is 5/5 and symmetric in upper and lower extremities.  Cranial nerves II through XII are grossly intact.  Data Reviewed: Results for orders placed or performed during the hospital encounter of 08/21/23 (from the past 24 hours)  CBC with Differential     Status: Abnormal   Collection Time: 08/21/23 12:00 PM  Result Value Ref Range   WBC 8.6 4.0 - 10.5 K/uL   RBC 4.60 4.22 - 5.81 MIL/uL   Hemoglobin 14.1 13.0 - 17.0 g/dL   HCT 55.8 60.9 - 47.9 %   MCV 95.9 80.0 - 100.0 fL   MCH 30.7 26.0 - 34.0 pg   MCHC 32.0 30.0 - 36.0 g/dL   RDW 87.0 88.4 - 84.4 %   Platelets 181 150 - 400 K/uL   nRBC 0.0 0.0 - 0.2 %   Neutrophils Relative % 88 %   Neutro Abs 7.5 1.7 - 7.7 K/uL   Lymphocytes Relative 6 %   Lymphs Abs 0.5 (L) 0.7 - 4.0 K/uL   Monocytes Relative 6 %   Monocytes Absolute 0.5 0.1 - 1.0 K/uL   Eosinophils Relative 0 %   Eosinophils Absolute 0.0 0.0 - 0.5 K/uL   Basophils Relative 0 %   Basophils Absolute 0.0 0.0 - 0.1 K/uL   Immature Granulocytes 0 %   Abs Immature Granulocytes 0.03 0.00 - 0.07 K/uL  Brain natriuretic peptide     Status: Abnormal   Collection Time: 08/21/23 12:00 PM  Result Value Ref Range   B Natriuretic Peptide 114.0 (H) 0.0 - 100.0 pg/mL  Basic metabolic panel     Status: Abnormal   Collection Time: 08/21/23 12:00 PM  Result Value Ref Range   Sodium 133 (L) 135 - 145 mmol/L   Potassium 3.4 (L) 3.5 - 5.1 mmol/L   Chloride 85 (L) 98 - 111 mmol/L   CO2 39 (H) 22 - 32 mmol/L   Glucose, Bld 133 (H) 70 - 99 mg/dL   BUN 18 8 - 23 mg/dL   Creatinine, Ser 9.13 0.61 - 1.24 mg/dL   Calcium  8.7 (L) 8.9 - 10.3 mg/dL   GFR, Estimated >39 >39 mL/min   Anion gap 9 5 - 15  Blood gas, venous (at [facility] and [facility])     Status: Abnormal   Collection Time: 08/21/23  2:19 PM  Result Value Ref Range   pH, Ven 7.44 (H) 7.25 - 7.43    pCO2, Ven 66 (H) 44 - 60 mmHg  pO2, Ven 37 32 - 45 mmHg   Bicarbonate 45.1 (H) 20.0 - 28.0 mmol/L   Acid-Base Excess 17.1 (H) 0.0 - 2.0 mmol/L   O2 Saturation 77 %   Patient temperature 36.4    Collection site RIGHT ANTECUBITAL    Drawn by [id number]     DG Chest 2 View Result Date: 08/21/2023 CLINICAL DATA:  Shortness of breath. EXAM: CHEST - 2 VIEW COMPARISON:  11/14/2012 FINDINGS: The lungs are clear without focal pneumonia, edema, pneumothorax or pleural effusion. Streaky opacity at the left base suggest atelectasis or scarring. Interstitial markings are diffusely coarsened with chronic features. The cardiopericardial silhouette is within normal limits for size. No acute bony abnormality. Telemetry leads overlie the chest. IMPRESSION: Streaky opacity at the left base suggests atelectasis or scarring. Electronically Signed   By: Camellia Candle M.D.   On: 08/21/2023 12:38     Assessment and Plan: No notes have been filed under this hospital service. Service: Hospitalist  Principal Problem:   Acute respiratory failure with hypoxia (HCC) Active Problems:   COPD, severe (HCC)   HTN (hypertension)   Dyslipidemia   CAD (coronary artery disease), significant requiring CABG per cath 10/19/12   S/P CABG x 3   Atrial fibrillation with RVR (HCC)   COPD with acute exacerbation (HCC)   Influenza A with pneumonia  Acute respiratory failure with hypoxia A-fib with RVR Will start Cardizem  drip.  Start Eliquis  Echocardiogram in the morning Check TSH COPD with acute exacerbation Antibiotics: none DuoNeb's every 6 scheduled with albuterol  every 2 when necessary Continue inhaled steroids and LA bronchodilator Solu-Medrol  60 mg IV every 12 hours Mucinex  Influenza with pneumonia Continue tamiflu  Hypertension Continue antihypertensives Coronary artery disease with history of CABG   Advance Care Planning:   Code Status: Full Code confirmed by patient  Consults: none  Family Communication:  daughter present  Severity of Illness: The appropriate patient status for this patient is INPATIENT. Inpatient status is judged to be reasonable and necessary in order to provide the required intensity of service to ensure the patient's safety. The patient's presenting symptoms, physical exam findings, and initial radiographic and laboratory data in the context of their chronic comorbidities is felt to place them at high risk for further clinical deterioration. Furthermore, it is not anticipated that the patient will be medically stable for discharge from the hospital within 2 midnights of admission.   * I certify that at the point of admission it is my clinical judgment that the patient will require inpatient hospital care spanning beyond 2 midnights from the point of admission due to high intensity of service, high risk for further deterioration and high frequency of surveillance required.*  Author: Rehaan Viloria J Renald Haithcock, DO 08/21/2023 4:14 PM  For on call review [email or web address].

## 2023-08-21 NOTE — ED Notes (Signed)
 Pt getting neb treatments. O2 sat 97%. Daughter at bedside.

## 2023-08-22 DIAGNOSIS — J9621 Acute and chronic respiratory failure with hypoxia: Secondary | ICD-10-CM | POA: Diagnosis not present

## 2023-08-22 DIAGNOSIS — J9622 Acute and chronic respiratory failure with hypercapnia: Secondary | ICD-10-CM

## 2023-08-22 DIAGNOSIS — I4891 Unspecified atrial fibrillation: Secondary | ICD-10-CM

## 2023-08-22 DIAGNOSIS — J441 Chronic obstructive pulmonary disease with (acute) exacerbation: Secondary | ICD-10-CM

## 2023-08-22 DIAGNOSIS — J9601 Acute respiratory failure with hypoxia: Secondary | ICD-10-CM | POA: Diagnosis not present

## 2023-08-22 LAB — BASIC METABOLIC PANEL
Anion gap: 7 (ref 5–15)
BUN: 22 mg/dL (ref 8–23)
CO2: 36 mmol/L — ABNORMAL HIGH (ref 22–32)
Calcium: 8.2 mg/dL — ABNORMAL LOW (ref 8.9–10.3)
Chloride: 86 mmol/L — ABNORMAL LOW (ref 98–111)
Creatinine, Ser: 0.79 mg/dL (ref 0.61–1.24)
GFR, Estimated: >60 mL/min (ref >60)
Glucose, Bld: 137 mg/dL — ABNORMAL HIGH (ref 70–99)
Potassium: 4.1 mmol/L (ref 3.5–5.1)
Sodium: 129 mmol/L — ABNORMAL LOW (ref 135–145)

## 2023-08-22 LAB — CBC
HCT: 40 % (ref 39.0–52.0)
Hemoglobin: 13.1 g/dL (ref 13.0–17.0)
MCH: 30.9 pg (ref 26.0–34.0)
MCHC: 32.8 g/dL (ref 30.0–36.0)
MCV: 94.3 fL (ref 80.0–100.0)
Platelets: 191 10*3/uL (ref 150–400)
RBC: 4.24 MIL/uL (ref 4.22–5.81)
RDW: 12.9 % (ref 11.5–15.5)
WBC: 7.2 10*3/uL (ref 4.0–10.5)
nRBC: 0 % (ref 0.0–0.2)

## 2023-08-22 LAB — TSH: TSH: 0.376 u[IU]/mL (ref 0.350–4.500)

## 2023-08-22 LAB — PROCALCITONIN: Procalcitonin: <0.1 ng/mL

## 2023-08-22 MED ORDER — DILTIAZEM HCL 30 MG PO TABS
30.0000 mg | ORAL_TABLET | Freq: Four times a day (QID) | ORAL | Status: AC
  Administered 2023-08-22 – 2023-08-24 (×8): 30 mg via ORAL
  Filled 2023-08-22 (×8): qty 1

## 2023-08-22 MED ORDER — BUDESONIDE 0.5 MG/2ML IN SUSP
0.5000 mg | Freq: Two times a day (BID) | RESPIRATORY_TRACT | Status: DC
  Administered 2023-08-22 – 2023-08-24 (×5): 0.5 mg via RESPIRATORY_TRACT
  Filled 2023-08-22 (×5): qty 2

## 2023-08-22 MED ORDER — IPRATROPIUM-ALBUTEROL 0.5-2.5 (3) MG/3ML IN SOLN
3.0000 mL | Freq: Four times a day (QID) | RESPIRATORY_TRACT | Status: AC
  Administered 2023-08-22 – 2023-08-23 (×7): 3 mL via RESPIRATORY_TRACT
  Filled 2023-08-22 (×7): qty 3

## 2023-08-22 MED ORDER — ARFORMOTEROL TARTRATE 15 MCG/2ML IN NEBU
15.0000 ug | INHALATION_SOLUTION | Freq: Two times a day (BID) | RESPIRATORY_TRACT | Status: DC
  Administered 2023-08-22 – 2023-08-24 (×5): 15 ug via RESPIRATORY_TRACT
  Filled 2023-08-22 (×5): qty 2

## 2023-08-22 NOTE — Progress Notes (Signed)
   08/22/23 0901  TOC Brief Assessment  Insurance and Status Reviewed  Patient has primary care physician Yes  Home environment has been reviewed From home  Prior level of function: Independent  Prior/Current Home Services No current home services  Social Drivers of Health Review SDOH reviewed no interventions necessary  Readmission risk has been reviewed Yes Dori)  Transition of care needs no transition of care needs at this time      Transition of Care Department (TOC) has reviewed patient and no TOC needs have been identified at this time. We will continue to monitor patient advancement through interdisciplinary progression rounds. If new patient transition needs arise, please place a TOC consult.

## 2023-08-22 NOTE — Progress Notes (Addendum)
 PROGRESS NOTE  [name] FMW:[id number] DOB: 12-16-1945 DOA: 08/21/2023 PCP: Buren Rock HERO, MD  Brief History:  78 year old male with a history of AAA s/p repair 03/26/21, coronary artery disease status post CABG 2014, hypertension, hyperlipidemia, tobacco abuse in remission, COPD presenting with 1 month of shortness of breath that significantly worsened past week.  He states that his wife recently passed away on 08-28-2023.  Since then, shortness of breath has significantly worsened.  He went to see his PCP and was diagnosed with influenza on 08/19/2023.  He was placed on prednisone  after given intramuscular injection of steroids and he was also given oseltamavir.  He was also set up with for home oxygen at 2 L at that time. Prior to his office visit on 08/19/2023, the patient had been using his wife's oxygen because of his shortness of breath. Despite the oseltamavir and prednisone , he continued to have worsening shortness of breath and coughing.  As result, the patient presented for further evaluation and treatment.  He denies any fevers, chills, chest pain, nausea, vomiting, diarrhea, abdominal pain, hemoptysis. The patient quit smoking 14 years ago after nearly 60-pack-year history.  In the ED, the patient was afebrile and hemodynamically stable.  He was noted to have oxygen saturation 88% on 3 L.  He was initially placed up to 10 L.  Chest x-ray showed left lower lobe opacity/atelectasis.  The patient was started on Solu-Medrol  bronchodilators.  He is admitted for further evaluation and treatment of his respiratory failure.  The patient developed atrial fibrillation with RVR.  He was placed on diltiazem  drip.  He spontaneously converted back to sinus rhythm.  He was transition to oral diltiazem .   Assessment/Plan:  Acute respiratory failure with hypoxia and hypercarbia -Secondary COPD exacerbation and influenza -Wean oxygen as tolerated back to baseline 2 L -aim for oxygen saturation  >90%  COPD exacerbation -Start Brovana  -Start Pulmicort  -Start DuoNebs -Continue IV Solu-Medrol   Influenza -Continue oseltamavir  Paroxysmal atrial fibrillation with RVR -Spontaneously converted back to sinus -Started on apixaban  -CHADSVASc = 4 (age x 2, HTN, CAD) -Echocardiogram -TSH 0.376 -Continue diltiazem  for now pending echo  Coronary artery disease -No chest pain presently -Status post CABG 2014 -May be able to discontinue Plavix  as the patient is now on apixaban   Mixed hyperlipidemia -Continue statin  Essential hypertension -Continue diltiazem  for now -Holding, amlodipine  lisinopril  and HCTZ to allow BP margin  Obesity -BMI 32.84 -Lifestyle modification  Hypokalemia -repleted      Family Communication:  no Family at bedside  Consultants:  none  Code Status:  FULL   DVT Prophylaxis: apixaban    Procedures: As Listed in Progress Note Above  Antibiotics: None       Subjective: Patient states that he is breathing somewhat better.  Denies any chest pain, nausea, vomiting, diarrhea, abdominal pain.  He denies any fevers or chills.  Objective: Vitals:   08/22/23 0743 08/22/23 0800 08/22/23 0805 08/22/23 0830  BP:  (!) 174/70  125/61  Pulse:  79  64  Resp:  (!) 28  (!) 24  Temp: (!) 97.5 F (36.4 C)     TempSrc: Axillary     SpO2:  (!) 88% (!) 88% 91%  Weight:      Height:        Intake/Output Summary (Last 24 hours) at 08/22/2023 0917 Last data filed at 08/22/2023 0800 Gross per 24 hour  Intake 275.64 ml  Output 750 ml  Net -474.36 ml   Weight change:  Exam:  General:  Pt is alert, follows commands appropriately, not in acute distress HEENT: No icterus, No thrush, No neck mass, [place]/AT Cardiovascular: RRR, S1/S2, no rubs, no gallops Respiratory: Diminished breath sounds.  Bibasilar rales.  Bibasilar wheeze. Abdomen: Soft/+BS, non tender, non distended, no guarding Extremities: No edema, No lymphangitis, No petechiae, No rashes,  no synovitis   Data Reviewed: I have personally reviewed following labs and imaging studies Basic Metabolic Panel: Recent Labs  Lab 08/21/23 1200 08/22/23 0436  NA 133* 129*  K 3.4* 4.1  CL 85* 86*  CO2 39* 36*  GLUCOSE 133* 137*  BUN 18 22  CREATININE 0.86 0.79  CALCIUM  8.7* 8.2*   Liver Function Tests: No results for input(s): AST, ALT, ALKPHOS, BILITOT, PROT, ALBUMIN  in the last 168 hours. No results for input(s): LIPASE, AMYLASE in the last 168 hours. No results for input(s): AMMONIA in the last 168 hours. Coagulation Profile: No results for input(s): INR, PROTIME in the last 168 hours. CBC: Recent Labs  Lab 08/21/23 1200 08/22/23 0436  WBC 8.6 7.2  NEUTROABS 7.5  --   HGB 14.1 13.1  HCT 44.1 40.0  MCV 95.9 94.3  PLT 181 191   Cardiac Enzymes: No results for input(s): CKTOTAL, CKMB, CKMBINDEX, TROPONINI in the last 168 hours. BNP: Invalid input(s): POCBNP CBG: No results for input(s): GLUCAP in the last 168 hours. HbA1C: No results for input(s): HGBA1C in the last 72 hours. Urine analysis:    Component Value Date/Time   COLORURINE YELLOW 10/19/2012 2323   APPEARANCEUR CLEAR 10/19/2012 2323   LABSPEC 1.012 10/19/2012 2323   PHURINE 6.5 10/19/2012 2323   GLUCOSEU NEGATIVE 10/19/2012 2323   HGBUR NEGATIVE 10/19/2012 2323   BILIRUBINUR NEGATIVE 10/19/2012 2323   KETONESUR NEGATIVE 10/19/2012 2323   PROTEINUR NEGATIVE 10/19/2012 2323   UROBILINOGEN 1.0 10/19/2012 2323   NITRITE NEGATIVE 10/19/2012 2323   LEUKOCYTESUR NEGATIVE 10/19/2012 2323   Sepsis Labs: @LABRCNTIP (procalcitonin:4,lacticidven:4) ) Recent Results (from the past 240 hours)  MRSA Next Gen by PCR, Nasal     Status: None   Collection Time: 08/21/23  4:21 PM   Specimen: Nasal Mucosa; Nasal Swab  Result Value Ref Range Status   MRSA by PCR Next Gen NOT DETECTED NOT DETECTED Final    Comment: (NOTE) The GeneXpert MRSA Assay (FDA approved for NASAL  specimens only), is one component of a comprehensive MRSA colonization surveillance program. It is not intended to diagnose MRSA infection nor to guide or monitor treatment for MRSA infections. Test performance is not FDA approved in patients less than 66 years old. Performed at [facility], [address], [place], [place] [address]      Scheduled Meds:  apixaban   5 mg Oral BID   arformoterol   15 mcg Nebulization BID   atorvastatin   40 mg Oral Daily   budesonide  (PULMICORT ) nebulizer solution  0.5 mg Nebulization BID   Chlorhexidine  Gluconate Cloth  6 each Topical Q0600   clopidogrel   75 mg Oral Q0600   diltiazem   30 mg Oral Q6H   guaiFENesin   600 mg Oral BID   ipratropium-albuterol   3 mL Nebulization Q6H   methylPREDNISolone  (SOLU-MEDROL ) injection  60 mg Intravenous Q12H   oseltamivir   75 mg Oral BID   Continuous Infusions:  Procedures/Studies: DG Chest 2 View Result Date: 08/21/2023 CLINICAL DATA:  Shortness of breath. EXAM: CHEST - 2 VIEW COMPARISON:  11/14/2012 FINDINGS: The lungs are clear without focal pneumonia, edema, pneumothorax  or pleural effusion. Streaky opacity at the left base suggest atelectasis or scarring. Interstitial markings are diffusely coarsened with chronic features. The cardiopericardial silhouette is within normal limits for size. No acute bony abnormality. Telemetry leads overlie the chest. IMPRESSION: Streaky opacity at the left base suggests atelectasis or scarring. Electronically Signed   By: Camellia Candle M.D.   On: 08/21/2023 12:38    Alm Schneider, DO  [facility]  If 7PM-7AM, please contact night-coverage [email or web address] Password [email or web address] 08/22/2023, 9:17 AM   LOS: 1 day

## 2023-08-22 NOTE — Hospital Course (Signed)
 78 year old male with a history of AAA s/p repair 03/26/21, coronary artery disease status post CABG 2014, hypertension, hyperlipidemia, tobacco abuse in remission, COPD presenting with 1 month of shortness of breath that significantly worsened past week.  He states that his wife recently passed away on Aug 15, 2023.  Since then, shortness of breath has significantly worsened.  He went to see his PCP and was diagnosed with influenza on 08/19/2023.  He was placed on prednisone  after given intramuscular injection of steroids and he was also given oseltamavir.  He was also set up with for home oxygen at 2 L at that time. Prior to his office visit on 08/19/2023, the patient had been using his wife's oxygen because of his shortness of breath. Despite the oseltamavir and prednisone , he continued to have worsening shortness of breath and coughing.  As result, the patient presented for further evaluation and treatment.  He denies any fevers, chills, chest pain, nausea, vomiting, diarrhea, abdominal pain, hemoptysis. The patient quit smoking 14 years ago after nearly 60-pack-year history.  In the ED, the patient was afebrile and hemodynamically stable.  He was noted to have oxygen saturation 88% on 3 L.  He was initially placed up to 10 L.  Chest x-ray showed left lower lobe opacity/atelectasis.  The patient was started on Solu-Medrol  bronchodilators.  He is admitted for further evaluation and treatment of his respiratory failure.  The patient developed atrial fibrillation with RVR.  He was placed on diltiazem  drip.  He spontaneously converted back to sinus rhythm.  He was transition to oral diltiazem .

## 2023-08-23 DIAGNOSIS — J9601 Acute respiratory failure with hypoxia: Secondary | ICD-10-CM | POA: Diagnosis not present

## 2023-08-23 DIAGNOSIS — J441 Chronic obstructive pulmonary disease with (acute) exacerbation: Secondary | ICD-10-CM | POA: Diagnosis not present

## 2023-08-23 DIAGNOSIS — J09X1 Influenza due to identified novel influenza A virus with pneumonia: Secondary | ICD-10-CM

## 2023-08-23 DIAGNOSIS — J9621 Acute and chronic respiratory failure with hypoxia: Secondary | ICD-10-CM | POA: Diagnosis not present

## 2023-08-23 LAB — MAGNESIUM: Magnesium: 2.2 mg/dL (ref 1.7–2.4)

## 2023-08-23 LAB — BASIC METABOLIC PANEL
Anion gap: 8 (ref 5–15)
BUN: 25 mg/dL — ABNORMAL HIGH (ref 8–23)
CO2: 36 mmol/L — ABNORMAL HIGH (ref 22–32)
Calcium: 8.4 mg/dL — ABNORMAL LOW (ref 8.9–10.3)
Chloride: 87 mmol/L — ABNORMAL LOW (ref 98–111)
Creatinine, Ser: 0.82 mg/dL (ref 0.61–1.24)
GFR, Estimated: >60 mL/min (ref >60)
Glucose, Bld: 146 mg/dL — ABNORMAL HIGH (ref 70–99)
Potassium: 4.2 mmol/L (ref 3.5–5.1)
Sodium: 131 mmol/L — ABNORMAL LOW (ref 135–145)

## 2023-08-23 MED ORDER — ALBUTEROL SULFATE (2.5 MG/3ML) 0.083% IN NEBU
2.5000 mg | INHALATION_SOLUTION | Freq: Four times a day (QID) | RESPIRATORY_TRACT | Status: DC
Start: 2023-08-24 — End: 2023-08-24

## 2023-08-23 MED ORDER — REVEFENACIN 175 MCG/3ML IN SOLN
175.0000 ug | Freq: Every day | RESPIRATORY_TRACT | Status: DC
  Administered 2023-08-24: 175 ug via RESPIRATORY_TRACT
  Filled 2023-08-23: qty 3

## 2023-08-23 MED ORDER — DILTIAZEM HCL ER COATED BEADS 120 MG PO CP24
120.0000 mg | ORAL_CAPSULE | Freq: Every day | ORAL | Status: DC
  Administered 2023-08-24: 120 mg via ORAL
  Filled 2023-08-23: qty 1

## 2023-08-23 NOTE — TOC Initial Note (Signed)
 Transition of Care [facility]) - Initial/Assessment Note    Patient Details  Name: [name] MRN: [id number] Date of Birth: Sep 27, 1945  Transition of Care [facility]) CM/SW Contact:    Juanda Noon Phone Number: 08/23/2023, 2:55 PM  Clinical Narrative:                 CSW spoke with patient at bedside. Patient shared that he lives alone but has support from his children , sister, and cousin. Patient stated that he has oxygen , walker, 3N1, and wheelchair. Patient states that he has never had HH before, but his wife use to have it when she was living. Patient could not recall oxygen company but stated that he had tanks at home already and on 2 liters, he thinks. TOC will continue to follow.    Expected Discharge Plan: Home/Self Care Barriers to Discharge: Continued Medical Work up   Patient Goals and CMS Choice Patient states their goals for this hospitalization and ongoing recovery are:: return back home CMS Medicare.gov Compare Post Acute Care list provided to:: Patient Choice offered to / list presented to : Patient      Expected Discharge Plan and Services In-house Referral: Clinical Social Work   Post Acute Care Choice: Durable Medical Equipment Living arrangements for the past 2 months: Single Family Home                 DME Arranged: N/A DME Agency: NA                  Prior Living Arrangements/Services Living arrangements for the past 2 months: Single Family Home Lives with:: Self Patient language and need for interpreter reviewed:: Yes Do you feel safe going back to the place where you live?: Yes      Need for Family Participation in Patient Care: Yes (Comment) Care giver support system in place?: Yes (comment) Current home services: DME Criminal Activity/Legal Involvement Pertinent to Current Situation/Hospitalization: No - Comment as needed  Activities of Daily Living   ADL Screening (condition at time of admission) Independently performs ADLs?: Yes  (appropriate for developmental age) Is the patient deaf or have difficulty hearing?: No Does the patient have difficulty seeing, even when wearing glasses/contacts?: No Does the patient have difficulty concentrating, remembering, or making decisions?: No  Permission Sought/Granted      Share Information with NAME: Hooman     Permission granted to share info w Relationship: Patient     Emotional Assessment Appearance:: Appears stated age   Affect (typically observed): Accepting, Appropriate Orientation: : Oriented to Self, Oriented to Place, Oriented to  Time, Oriented to Situation Alcohol / Substance Use: Not Applicable Psych Involvement: No (comment)  Admission diagnosis:  Grief [F43.21] Influenza A [J10.1] COPD exacerbation (HCC) [J44.1] Atrial fibrillation with rapid ventricular response (HCC) [I48.91] Acute respiratory failure with hypoxia (HCC) [J96.01] Patient Active Problem List   Diagnosis Date Noted   Acute on chronic respiratory failure with hypoxia and hypercapnia (HCC) 08/22/2023   Acute respiratory failure with hypoxia (HCC) 08/21/2023   Atrial fibrillation with RVR (HCC) 08/21/2023   COPD with acute exacerbation (HCC) 08/21/2023   Influenza A with pneumonia 08/21/2023   Trochanteric bursitis of right hip 02/22/2021   Greater trochanteric bursitis of right hip 02/22/2021   Heart disease 01/16/2021   AAA (abdominal aortic aneurysm) without rupture (HCC) 12/22/2020   Hyperlipidemia 12/19/2020   Kidney stones 12/19/2020   Dyspnea 11/29/2017   Lung nodule 08/10/2013   Sternal pain  07/25/2013   COPD, 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   Tobacco use disorder 10/19/2012   Unstable angina (HCC) 10/18/2012   HTN (hypertension) 10/18/2012   Dyslipidemia 10/18/2012   CAD (coronary artery disease), significant requiring CABG per cath 10/19/12 10/18/2012   PCP:  Macie Saxon, MD Pharmacy:   [facility] - [place], [place] - [address] [place] [address] Phone: [phone] Fax: [phone]     Social Drivers of Health (SDOH) Social History: SDOH Screenings   Food Insecurity: No Food Insecurity (08/21/2023)  Housing: Low Risk  (08/21/2023)  Transportation Needs: No Transportation Needs (08/21/2023)  Utilities: Not At Risk (08/21/2023)  Social Connections: Moderately Isolated (08/21/2023)  Tobacco Use: Medium Risk (08/21/2023)   SDOH Interventions:     Readmission Risk Interventions    08/23/2023    2:52 PM  Readmission Risk Prevention Plan  Medication Screening Complete  Transportation Screening Complete

## 2023-08-23 NOTE — Progress Notes (Addendum)
Patient Saturations on Room Air at Rest = 86%  Patient Saturations on [employer] while Ambulating = 85%  Patient Saturations on 4 Liters of oxygen while Ambulating = 92%  Patient could not maintain oxygen saturations above 90% without oxygen.

## 2023-08-23 NOTE — Progress Notes (Addendum)
 PROGRESS NOTE  [name] GMW:[id number] DOB: 1945-09-13 DOA: 08/21/2023 PCP: Macie Saxon, MD  Brief History:  78 year old male with a history of AAA s/p repair 03/26/21, coronary artery disease status post CABG 2014, hypertension, hyperlipidemia, tobacco abuse in remission, COPD presenting with 1 month of shortness of breath that significantly worsened past week.  He states that his wife recently passed away on 08-24-23.  Since then, shortness of breath has significantly worsened.  He went to see his PCP and was diagnosed with influenza on 08/19/2023.  He was placed on prednisone  after given intramuscular injection of steroids and he was also given oseltamavir.  He was also set up with for home oxygen at 2 L at that time. Prior to his office visit on 08/19/2023, the patient had been using his wife's oxygen because of his shortness of breath. Despite the oseltamavir and prednisone , he continued to have worsening shortness of breath and coughing.  As result, the patient presented for further evaluation and treatment.  He denies any fevers, chills, chest pain, nausea, vomiting, diarrhea, abdominal pain, hemoptysis. The patient quit smoking 14 years ago after nearly 60-pack-year history.  In the ED, the patient was afebrile and hemodynamically stable.  He was noted to have oxygen saturation 88% on 3 L.  He was initially placed up to 10 L.  Chest x-ray showed left lower lobe opacity/atelectasis.  The patient was started on Solu-Medrol  bronchodilators.  He is admitted for further evaluation and treatment of his respiratory failure.  The patient developed atrial fibrillation with RVR.  He was placed on diltiazem  drip.  He spontaneously converted back to sinus rhythm.  He was transition to oral diltiazem .   Assessment/Plan:  Acute respiratory failure with hypoxia and hypercarbia -Secondary COPD exacerbation and influenza -Wean oxygen as tolerated back to baseline 2 L -aim for oxygen saturation  >90%   COPD exacerbation -Started Brovana  -Started Pulmicort  -Started DuoNebs -Continue IV Solu-Medrol  -add yupelri    Influenza -Continue oseltamavir   Paroxysmal atrial fibrillation with RVR -Spontaneously converted back to sinus -Started on apixaban  -CHADSVASc = 4 (age x 2, HTN, CAD) -Echocardiogram--pending -TSH 0.376 -Continue diltiazem  for now pending echo   Coronary artery disease -No chest pain presently -Status post CABG 2014 -May be able to discontinue Plavix  as the patient is now on apixaban    Mixed hyperlipidemia -Continue statin   Essential hypertension -Continue diltiazem  for now -Holding, amlodipine  lisinopril  and HCTZ to allow BP margin   Obesity -BMI 32.84 -Lifestyle modification   Hypokalemia -repleted           Family Communication:  no Family at bedside   Consultants:  none   Code Status:  FULL    DVT Prophylaxis: apixaban      Procedures: As Listed in Progress Note Above   Antibiotics: None        Subjective: Pt is breathing better.  Still has cough.  Denies f/,c cp, nv/d/, abd pain  Objective: Vitals:   08/23/23 1630 08/23/23 1700 08/23/23 1730 08/23/23 1759  BP: (!) 154/47 (!) 164/57 (!) 174/61 (!) 139/41  Pulse: 72 76 78 87  Resp: (!) 21 16 19 12   Temp:    97.6 F (36.4 C)  TempSrc:    Oral  SpO2: 94% 90% 94% 90%  Weight:      Height:        Intake/Output Summary (Last 24 hours) at 08/23/2023 1848 Last data filed at 08/23/2023 1625 Gross per  24 hour  Intake 240 ml  Output 900 ml  Net -660 ml   Weight change: 1.413 kg Exam:  General:  Pt is alert, follows commands appropriately, not in acute distress HEENT: No icterus, No thrush, No neck mass, [place]/AT Cardiovascular: RRR, S1/S2, no rubs, no gallops Respiratory: diminished BS.  Bibasilar rales.  Min basilar wheeze Abdomen: Soft/+BS, non tender, non distended, no guarding Extremities: trace LE edema, No lymphangitis, No petechiae, No rashes, no  synovitis   Data Reviewed: I have personally reviewed following labs and imaging studies Basic Metabolic Panel: Recent Labs  Lab 08/21/23 1200 08/22/23 0436 08/23/23 0400  NA 133* 129* 131*  K 3.4* 4.1 4.2  CL 85* 86* 87*  CO2 39* 36* 36*  GLUCOSE 133* 137* 146*  BUN 18 22 25*  CREATININE 0.86 0.79 0.82  CALCIUM  8.7* 8.2* 8.4*  MG  --   --  2.2   Liver Function Tests: No results for input(s): "AST", "ALT", "ALKPHOS", "BILITOT", "PROT", "ALBUMIN " in the last 168 hours. No results for input(s): "LIPASE", "AMYLASE" in the last 168 hours. No results for input(s): "AMMONIA" in the last 168 hours. Coagulation Profile: No results for input(s): "INR", "PROTIME" in the last 168 hours. CBC: Recent Labs  Lab 08/21/23 1200 08/22/23 0436  WBC 8.6 7.2  NEUTROABS 7.5  --   HGB 14.1 13.1  HCT 44.1 40.0  MCV 95.9 94.3  PLT 181 191   Cardiac Enzymes: No results for input(s): "CKTOTAL", "CKMB", "CKMBINDEX", "TROPONINI" in the last 168 hours. BNP: Invalid input(s): "POCBNP" CBG: No results for input(s): "GLUCAP" in the last 168 hours. HbA1C: No results for input(s): "HGBA1C" in the last 72 hours. Urine analysis:    Component Value Date/Time   COLORURINE YELLOW 10/19/2012 2323   APPEARANCEUR CLEAR 10/19/2012 2323   LABSPEC 1.012 10/19/2012 2323   PHURINE 6.5 10/19/2012 2323   GLUCOSEU NEGATIVE 10/19/2012 2323   HGBUR NEGATIVE 10/19/2012 2323   BILIRUBINUR NEGATIVE 10/19/2012 2323   KETONESUR NEGATIVE 10/19/2012 2323   PROTEINUR NEGATIVE 10/19/2012 2323   UROBILINOGEN 1.0 10/19/2012 2323   NITRITE NEGATIVE 10/19/2012 2323   LEUKOCYTESUR NEGATIVE 10/19/2012 2323   Sepsis Labs: @LABRCNTIP (procalcitonin:4,lacticidven:4) ) Recent Results (from the past 240 hours)  MRSA Next Gen by PCR, Nasal     Status: None   Collection Time: 08/21/23  4:21 PM   Specimen: Nasal Mucosa; Nasal Swab  Result Value Ref Range Status   MRSA by PCR Next Gen NOT DETECTED NOT DETECTED Final     Comment: (NOTE) The GeneXpert MRSA Assay (FDA approved for NASAL specimens only), is one component of a comprehensive MRSA colonization surveillance program. It is not intended to diagnose MRSA infection nor to guide or monitor treatment for MRSA infections. Test performance is not FDA approved in patients less than 66 years old. Performed at [facility], [address], [place],  [address]      Scheduled Meds:  apixaban   5 mg Oral BID   arformoterol   15 mcg Nebulization BID   atorvastatin   40 mg Oral Daily   budesonide  (PULMICORT ) nebulizer solution  0.5 mg Nebulization BID   Chlorhexidine  Gluconate Cloth  6 each Topical Q0600   clopidogrel   75 mg Oral Q0600   diltiazem   30 mg Oral Q6H   guaiFENesin   600 mg Oral BID   ipratropium-albuterol   3 mL Nebulization Q6H   methylPREDNISolone  (SOLU-MEDROL ) injection  60 mg Intravenous Q12H   oseltamivir   75 mg Oral BID   Continuous Infusions:  Procedures/Studies: DG Chest 2 View Result Date: 08/21/2023 CLINICAL DATA:  Shortness of breath. EXAM: CHEST - 2 VIEW COMPARISON:  11/14/2012 FINDINGS: The lungs are clear without focal pneumonia, edema, pneumothorax or pleural effusion. Streaky opacity at the left base suggest atelectasis or scarring. Interstitial markings are diffusely coarsened with chronic features. The cardiopericardial silhouette is within normal limits for size. No acute bony abnormality. Telemetry leads overlie the chest. IMPRESSION: Streaky opacity at the left base suggests atelectasis or scarring. Electronically Signed   By: Donnal Fusi M.D.   On: 08/21/2023 12:38    Demaris Fillers, DO  [facility]  If 7PM-7AM, please contact night-coverage [email or web address] Password [facility] 08/23/2023, 6:48 PM   LOS: 2 days

## 2023-08-24 ENCOUNTER — Inpatient Hospital Stay (HOSPITAL_COMMUNITY): Payer: Medicare Other

## 2023-08-24 ENCOUNTER — Other Ambulatory Visit (HOSPITAL_COMMUNITY): Payer: Self-pay | Admitting: *Deleted

## 2023-08-24 DIAGNOSIS — I4891 Unspecified atrial fibrillation: Secondary | ICD-10-CM

## 2023-08-24 DIAGNOSIS — J9621 Acute and chronic respiratory failure with hypoxia: Secondary | ICD-10-CM | POA: Diagnosis not present

## 2023-08-24 DIAGNOSIS — J441 Chronic obstructive pulmonary disease with (acute) exacerbation: Secondary | ICD-10-CM | POA: Diagnosis not present

## 2023-08-24 DIAGNOSIS — J9601 Acute respiratory failure with hypoxia: Secondary | ICD-10-CM | POA: Diagnosis not present

## 2023-08-24 LAB — MAGNESIUM: Magnesium: 2.2 mg/dL (ref 1.7–2.4)

## 2023-08-24 LAB — BASIC METABOLIC PANEL
Anion gap: 6 (ref 5–15)
BUN: 25 mg/dL — ABNORMAL HIGH (ref 8–23)
CO2: 35 mmol/L — ABNORMAL HIGH (ref 22–32)
Calcium: 8.3 mg/dL — ABNORMAL LOW (ref 8.9–10.3)
Chloride: 90 mmol/L — ABNORMAL LOW (ref 98–111)
Creatinine, Ser: 0.75 mg/dL (ref 0.61–1.24)
GFR, Estimated: >60 mL/min (ref >60)
Glucose, Bld: 138 mg/dL — ABNORMAL HIGH (ref 70–99)
Potassium: 4.3 mmol/L (ref 3.5–5.1)
Sodium: 131 mmol/L — ABNORMAL LOW (ref 135–145)

## 2023-08-24 LAB — ECHOCARDIOGRAM COMPLETE
AR max vel: 2.15 cm2
AV Area VTI: 2.12 cm2
AV Area mean vel: 2.16 cm2
AV Mean grad: 11 mm[Hg]
AV Peak grad: 22.5 mm[Hg]
Ao pk vel: 2.37 m/s
Area-P 1/2: 3.76 cm2
Calc EF: 66.1 %
Height: 67 in
MV VTI: 2.43 cm2
S' Lateral: 2.6 cm
Single Plane A2C EF: 62.9 %
Single Plane A4C EF: 70.5 %
Weight: 3329.83 [oz_av]

## 2023-08-24 LAB — PHOSPHORUS: Phosphorus: 2.5 mg/dL (ref 2.5–4.6)

## 2023-08-24 MED ORDER — DILTIAZEM HCL ER COATED BEADS 120 MG PO CP24: 120.0000 mg | ORAL_CAPSULE | Freq: Every day | ORAL | 1 refills | Status: DC

## 2023-08-24 MED ORDER — ALBUTEROL SULFATE (2.5 MG/3ML) 0.083% IN NEBU
2.5000 mg | INHALATION_SOLUTION | Freq: Three times a day (TID) | RESPIRATORY_TRACT | Status: DC
  Administered 2023-08-24: 2.5 mg via RESPIRATORY_TRACT
  Filled 2023-08-24 (×2): qty 3

## 2023-08-24 MED ORDER — APIXABAN 5 MG PO TABS
5.0000 mg | ORAL_TABLET | Freq: Two times a day (BID) | ORAL | 1 refills | Status: DC
Start: 2023-08-24 — End: 2023-09-09

## 2023-08-24 NOTE — Progress Notes (Addendum)
    [name] has severe/advanced COPD with chronic hypoxic and hypercapnic respiratory failure.  Despite aggressive treatment , patient continues to exhibit signs of significant hypercapnia associated with chronic respiratory failure secondary to  COPD.  Patient requires the use of NIV to help with exacerbation periods.   -VBG on admission was-- PH 7.44, pCo2 was 66, oxygen sats was 77    - The use of the NIV will treat patient's high PCO2 levels (please see ABG results on Bipap), and use of NIV can reduce risk of exacerbation in future hospitalizations when used at night and during the day.   Patient will need these advanced treatments in conjunction with the current medication regimen and aggressive pulmonary treatment to reduce exacerbations and subsequent hospital admissions:   Failure to have NIV available for use could lead to recurrent hospitalizations and exacerbations  Onalee Hua Pradyun Ishman, DO

## 2023-08-24 NOTE — TOC Progression Note (Signed)
Transition of Care [facility]) - Progression Note    Patient Details  Name: [name] MRN: [id number] Date of Birth: 1946/04/16  [facility]) CM/SW Contact  Karn Cassis, [place] Phone Number: 08/24/2023, 11:44 AM  Clinical Narrative:  Pt's home O2 is through [employer]. LCSW notified Ashly with [employer] that pt has new home O2 orders and sat qual note is in for 4L continuous home O2. Pt reports he has portable tank for transport home. Pt aware he could qualify for NIV, but he refuses. Ashly updated. No other needs reported.      Expected Discharge Plan: Home/Self Care Barriers to Discharge: Continued Medical Work up  Expected Discharge Plan and Services In-house Referral: Clinical Social Work   Post Acute Care Choice: Durable Medical Equipment Living arrangements for the past 2 months: Single Family Home                 DME Arranged: N/A DME Agency: NA                   Social Determinants of Health (SDOH) Interventions SDOH Screenings   Food Insecurity: No Food Insecurity (08/21/2023)  Housing: Low Risk  (08/21/2023)  Transportation Needs: No Transportation Needs (08/21/2023)  Utilities: Not At Risk (08/21/2023)  Social Connections: Moderately Isolated (08/21/2023)  Tobacco Use: Medium Risk (08/21/2023)    Readmission Risk Interventions    08/23/2023    2:52 PM  Readmission Risk Prevention Plan  Medication Screening Complete  Transportation Screening Complete

## 2023-08-24 NOTE — Progress Notes (Signed)
SATURATION QUALIFICATIONS: (This note is used to comply with regulatory documentation for home oxygen)   Patient Saturations on Room Air at Rest = 85   Patient Saturations on Room Air while Ambulating = N/A   Patient Saturations on 4 Liters of oxygen while Ambulating = 92   Please briefly explain why patient needs home oxygen: To maintain 02 sat at 90% or above during ambulation.  Catarina Hartshorn, DO

## 2023-08-24 NOTE — Discharge Summary (Addendum)
Physician Discharge Summary   Patient: [name] MRN: [id number] DOB: [birth year]  Admit date:     08/21/2023  Discharge date: 08/24/23  Discharge Physician: Onalee Hua Laconda Basich   PCP: Leanna Sato, MD   Recommendations at discharge:   Please follow up with primary care provider within 1-2 weeks  Please repeat BMP and CBC in one week    Hospital Course: 78 year old male with a history of AAA s/p repair 03/26/21, coronary artery disease status post CABG 2014, hypertension, hyperlipidemia, tobacco abuse in remission, COPD presenting with 1 month of shortness of breath that significantly worsened past week.  He states that his wife recently passed away on 2023-08-16.  Since then, shortness of breath has significantly worsened.  He went to see his PCP and was diagnosed with influenza on 08/19/2023.  He was placed on prednisone after given intramuscular injection of steroids and he was also given oseltamavir.  He was also set up with for home oxygen at 2 L at that time. Prior to his office visit on 08/19/2023, the patient had been using his wife's oxygen because of his shortness of breath. Despite the oseltamavir and prednisone, he continued to have worsening shortness of breath and coughing.  As result, the patient presented for further evaluation and treatment.  He denies any fevers, chills, chest pain, nausea, vomiting, diarrhea, abdominal pain, hemoptysis. The patient quit smoking 14 years ago after nearly 60-pack-year history.  In the ED, the patient was afebrile and hemodynamically stable.  He was noted to have oxygen saturation 88% on 3 L.  He was initially placed up to 10 L.  Chest x-ray showed left lower lobe opacity/atelectasis.  The patient was started on Solu-Medrol bronchodilators.  He is admitted for further evaluation and treatment of his respiratory failure.  The patient developed atrial fibrillation with RVR.  He was placed on diltiazem drip.  He spontaneously converted back to sinus rhythm.  He  was transition to oral diltiazem.  Assessment and Plan: Acute respiratory failure with hypoxia and hypercarbia -Secondary COPD exacerbation and influenza -Wean oxygen as tolerated back to baseline 2 L -on 4L during hospitalization -aim for oxygen saturation >90% -oxygen 4L will be set up for patient to go home -pt did not want NIV for home use   COPD exacerbation -Started Brovana -Started Pulmicort -Started DuoNebs -Continue IV Solu-Medrol>>d/c home with prednisone -added yupelri   Influenza -Continue oseltamavir x 4 more doses after d/c   Paroxysmal atrial fibrillation with RVR -Spontaneously converted back to sinus -Started on apixaban -CHADSVASc = 4 (age x 2, HTN, CAD) -Echo EF 60-6%%, no WMA, G1DD -TSH 0.376 -Continue diltiazem for now pending echo -ambulatory referral to afib clinic   Coronary artery disease -No chest pain presently -Status post CABG 2014 -May be able to discontinue Plavix as the patient is now on apixaban   Mixed hyperlipidemia -Continue statin   Essential hypertension -Continue diltiazem for now -Holding, amlodipine lisinopril and HCTZ to allow BP margin   Obesity -BMI 32.84 -Lifestyle modification   Hypokalemia -repleted   Consultants: none Procedures performed: none  Disposition: Home Diet recommendation:  Cardiac diet DISCHARGE MEDICATION: Allergies as of 08/24/2023   No Known Allergies      Medication List     STOP taking these medications    amLODipine 2.5 MG tablet Commonly known as: NORVASC   ibuprofen 200 MG tablet Commonly known as: ADVIL   lisinopril-hydrochlorothiazide 10-12.5 MG tablet Commonly known as: ZESTORETIC   metoprolol succinate 25 MG 24  hr tablet Commonly known as: TOPROL-XL   predniSONE 20 MG tablet Commonly known as: DELTASONE       TAKE these medications    Advair HFA 115-21 MCG/ACT inhaler Generic drug: fluticasone-salmeterol Inhale 2 puffs into the lungs 2 (two) times daily.    albuterol 108 (90 Base) MCG/ACT inhaler Commonly known as: VENTOLIN HFA Inhale 1-2 puffs into the lungs every 6 (six) hours as needed for wheezing or shortness of breath.   apixaban 5 MG Tabs tablet Commonly known as: ELIQUIS Take 1 tablet (5 mg total) by mouth 2 (two) times daily.   atorvastatin 40 MG tablet Commonly known as: LIPITOR Take 40 mg by mouth daily.   clopidogrel 75 MG tablet Commonly known as: PLAVIX Take 1 tablet (75 mg total) by mouth daily at 6 (six) AM.   diltiazem 120 MG 24 hr capsule Commonly known as: CARDIZEM CD Take 1 capsule (120 mg total) by mouth daily. Start taking on: August 25, 2023   guaiFENesin-codeine 100-10 MG/5ML syrup Take 5 mLs by mouth every 6 (six) hours as needed for cough.   oseltamivir 75 MG capsule Commonly known as: TAMIFLU Take 75 mg by mouth 2 (two) times daily.   PreserVision AREDS 2 Caps Take 1 capsule by mouth in the morning and at bedtime.   Spiriva HandiHaler 18 MCG inhalation capsule Generic drug: tiotropium Place 18 mcg into inhaler and inhale daily.   Vitamin D 125 MCG (5000 UT) Caps Take by mouth.               Durable Medical Equipment  (From admission, onward)           Start     Ordered   08/24/23 1104  For home use only DME oxygen  Once       Comments: POC for COPD  Question Answer Comment  Length of Need 12 Months   Mode or (Route) Nasal cannula   Liters per Minute 4   Frequency Continuous (stationary and portable oxygen unit needed)   Oxygen conserving device Yes   Oxygen delivery system Gas      08/24/23 1103            Discharge Exam: Filed Weights   08/21/23 1703 08/22/23 0500 08/23/23 0439  Weight: 93.8 kg 95.1 kg 94.4 kg   HEENT:  [place]/AT, No thrush, no icterus CV:  RRR, no rub, no S3, no S4 Lung:  diminished BS.  Basilar rales. No wheeze Abd:  soft/+BS, NT Ext:  No edema, no lymphangitis, no synovitis, no rash   Condition at discharge: stable  The results of  significant diagnostics from this hospitalization (including imaging, microbiology, ancillary and laboratory) are listed below for reference.   Imaging Studies: ECHOCARDIOGRAM COMPLETE Result Date: 08/24/2023    ECHOCARDIOGRAM REPORT   Patient Name:   ALFREDDIE CONSALVO Date of Exam: 08/24/2023 Medical Rec #:  [id number]    Height:       67.0 in Accession #:    [id number]   Weight:       208.1 lb Date of Birth:  [birth year]   BSA:          2.057 m Patient Age:    77 years     BP:           134/43 mmHg Patient Gender: M            HR:           75 bpm. [facility]am [facility] Procedure: 2D Echo, Cardiac Doppler and Color Doppler Indications:    A Fib  History:        Patient has no prior history of Echocardiogram examinations.                 AAA, CAD, Prior CABG, COPD; Risk Factors:Hypertension.  Sonographer:    Amy Chionchio Referring Phys: [id number] Arleatha Philipps IMPRESSIONS  1. Left ventricular ejection fraction, by estimation, is 60 to 65%. The left ventricle has normal function. The left ventricle has no regional wall motion abnormalities. Left ventricular diastolic parameters are consistent with Grade I diastolic dysfunction (impaired relaxation). Elevated left ventricular end-diastolic pressure.  2. Right ventricular systolic function is normal. The right ventricular size is mildly enlarged. Tricuspid regurgitation signal is inadequate for assessing PA pressure.  3. Left atrial size was mildly dilated.  4. The mitral valve is abnormal. No evidence of mitral valve regurgitation. No evidence of mitral stenosis. Severe mitral annular calcification.  5. The aortic valve is tricuspid. There is moderate calcification of the aortic valve. Aortic valve regurgitation is not visualized. Mild aortic valve stenosis. Aortic valve area, by VTI measures 2.12 cm. Aortic valve mean gradient measures 11.0 mmHg. Aortic valve Vmax measures 2.37 m/s.  6. Aortic dilatation noted. There is mild dilatation of the aortic root, measuring 40  mm. Normal size of ascending aorta.  7. The inferior vena cava is normal in size with greater than 50% respiratory variability, suggesting right atrial pressure of 3 mmHg. Comparison(s): No prior Echocardiogram. FINDINGS  Left Ventricle: Left ventricular ejection fraction, by estimation, is 60 to 65%. The left ventricle has normal function. The left ventricle has no regional wall motion abnormalities. The left ventricular internal cavity size was normal in size. There is  no left ventricular hypertrophy. Left ventricular diastolic parameters are consistent with Grade I diastolic dysfunction (impaired relaxation). Elevated left ventricular end-diastolic pressure. Right Ventricle: The right ventricular size is mildly enlarged. No increase in right ventricular wall thickness. Right ventricular systolic function is normal. Tricuspid regurgitation signal is inadequate for assessing PA pressure. Left Atrium: Left atrial size was mildly dilated. Right Atrium: Right atrial size was normal in size. Pericardium: There is no evidence of pericardial effusion. Mitral Valve: The mitral valve is abnormal. Severe mitral annular calcification. No evidence of mitral valve regurgitation. No evidence of mitral valve stenosis. MV peak gradient, 7.8 mmHg. The mean mitral valve gradient is 2.0 mmHg. Tricuspid Valve: The tricuspid valve is normal in structure. Tricuspid valve regurgitation is not demonstrated. No evidence of tricuspid stenosis. Aortic Valve: The aortic valve is tricuspid. There is moderate calcification of the aortic valve. Aortic valve regurgitation is not visualized. Mild aortic stenosis is present. Aortic valve mean gradient measures 11.0 mmHg. Aortic valve peak gradient measures 22.5 mmHg. Aortic valve area, by VTI measures 2.12 cm. Pulmonic Valve: The pulmonic valve was normal in structure. Pulmonic valve regurgitation is mild. No evidence of pulmonic stenosis. Aorta: Aortic dilatation noted. There is mild dilatation  of the aortic root, measuring 40 mm. Venous: The inferior vena cava is normal in size with greater than 50% respiratory variability, suggesting right atrial pressure of 3 mmHg. IAS/Shunts: No atrial level shunt detected by color flow Doppler.  LEFT VENTRICLE PLAX 2D LVIDd:         4.50 cm      Diastology LVIDs:         2.60 cm      LV e' medial:    6.74 cm/s LV  PW:         1.10 cm      LV E/e' medial:  19.1 LV IVS:        1.10 cm      LV e' lateral:   5.77 cm/s LVOT diam:     2.20 cm      LV E/e' lateral: 22.4 LV SV:         91 LV SV Index:   44 LVOT Area:     3.80 cm  LV Volumes (MOD) LV vol d, MOD A2C: 115.0 ml LV vol d, MOD A4C: 117.0 ml LV vol s, MOD A2C: 42.7 ml LV vol s, MOD A4C: 34.5 ml LV SV MOD A2C:     72.3 ml LV SV MOD A4C:     117.0 ml LV SV MOD BP:      78.7 ml RIGHT VENTRICLE             IVC RV Basal diam:  4.10 cm     IVC diam: 2.00 cm RV Mid diam:    3.90 cm RV S prime:     10.60 cm/s TAPSE (M-mode): 2.0 cm LEFT ATRIUM             Index        RIGHT ATRIUM           Index LA Vol (A2C):   41.8 ml 20.32 ml/m  RA Area:     20.70 cm LA Vol (A4C):   73.7 ml 35.83 ml/m  RA Volume:   55.70 ml  27.08 ml/m LA Biplane Vol: 57.3 ml 27.86 ml/m  AORTIC VALVE                     PULMONIC VALVE AV Area (Vmax):    2.15 cm      PV Vmax:          0.96 m/s AV Area (Vmean):   2.16 cm      PV Peak grad:     3.7 mmHg AV Area (VTI):     2.12 cm      PR End Diast Vel: 7.40 msec AV Vmax:           237.00 cm/s AV Vmean:          149.000 cm/s AV VTI:            0.429 m AV Peak Grad:      22.5 mmHg AV Mean Grad:      11.0 mmHg LVOT Vmax:         134.00 cm/s LVOT Vmean:        84.600 cm/s LVOT VTI:          0.239 m LVOT/AV VTI ratio: 0.56  AORTA Ao Root diam: 4.00 cm Ao Asc diam:  3.70 cm MITRAL VALVE MV Area (PHT): 3.76 cm     SHUNTS MV Area VTI:   2.43 cm     Systemic VTI:  0.24 m MV Peak grad:  7.8 mmHg     Systemic Diam: 2.20 cm MV Mean grad:  2.0 mmHg MV Vmax:       1.40 m/s MV Vmean:      60.4 cm/s MV Decel  Time: 202 msec MV E velocity: 129.00 cm/s MV A velocity: 119.00 cm/s MV E/A ratio:  1.08 Vishnu Priya Mallipeddi Electronically signed by Winfield Rast Mallipeddi Signature Date/Time: 08/24/2023/5:14:43 PM    Final    DG Chest 2 View Result Date: 08/21/2023  CLINICAL DATA:  Shortness of breath. EXAM: CHEST - 2 VIEW COMPARISON:  11/14/2012 FINDINGS: The lungs are clear without focal pneumonia, edema, pneumothorax or pleural effusion. Streaky opacity at the left base suggest atelectasis or scarring. Interstitial markings are diffusely coarsened with chronic features. The cardiopericardial silhouette is within normal limits for size. No acute bony abnormality. Telemetry leads overlie the chest. IMPRESSION: Streaky opacity at the left base suggests atelectasis or scarring. Electronically Signed   By: Kennith Center M.D.   On: 08/21/2023 12:38    Microbiology: Results for orders placed or performed during the hospital encounter of 08/21/23  MRSA Next Gen by PCR, Nasal     Status: None   Collection Time: 08/21/23  4:21 PM   Specimen: Nasal Mucosa; Nasal Swab  Result Value Ref Range Status   MRSA by PCR Next Gen NOT DETECTED NOT DETECTED Final    Comment: (NOTE) The GeneXpert MRSA Assay (FDA approved for NASAL specimens only), is one component of a comprehensive MRSA colonization surveillance program. It is not intended to diagnose MRSA infection nor to guide or monitor treatment for MRSA infections. Test performance is not FDA approved in patients less than 26 years old. Performed at [facility], [address] [place], [place] [address]     Labs: CBC: Recent Labs  Lab 08/21/23 1200 08/22/23 0436  WBC 8.6 7.2  NEUTROABS 7.5  --   HGB 14.1 13.1  HCT 44.1 40.0  MCV 95.9 94.3  PLT 181 191   Basic Metabolic Panel: Recent Labs  Lab 08/21/23 1200 08/22/23 0436 08/23/23 0400 08/24/23 0413  NA 133* 129* 131* 131*  K 3.4* 4.1 4.2 4.3  CL 85* 86* 87* 90*  CO2 39* 36* 36* 35*  GLUCOSE 133*  137* 146* 138*  BUN 18 22 25* 25*  CREATININE 0.86 0.79 0.82 0.75  CALCIUM 8.7* 8.2* 8.4* 8.3*  MG  --   --  2.2 2.2  PHOS  --   --   --  2.5   Liver Function Tests: No results for input(s): "AST", "ALT", "ALKPHOS", "BILITOT", "PROT", "ALBUMIN" in the last 168 hours. CBG: No results for input(s): "GLUCAP" in the last 168 hours.  Discharge time spent: greater than 30 minutes.  Signed: Catarina Hartshorn, MD [facility] 08/24/2023

## 2023-08-24 NOTE — Progress Notes (Signed)
Physical therapy in with patient.     Celesta Gentile, RCS

## 2023-08-24 NOTE — Evaluation (Signed)
Physical Therapy Evaluation Patient Details Name: BRENNER VISCONTI MRN: [id number] DOB: January 09, 1946 Today's Date: 08/24/2023  History of Present Illness  KONNOR VONDRASEK is a 78 y.o. male with medical history significant of COPD on intermittent use of oxygen, coronary artery disease, hypertension, history of MI, AAA, peripheral artery disease status post stenting.  Patient recently lost his wife and had a funeral about a week ago.  He then developed COPD exacerbation and was diagnosed with pneumonia.  He started on Tamiflu and steroids 2 days ago, however he became more short of breath over the last 24 hours.  He came to the hospital and was found to be hypoxic with oxygen saturations in the 80s.  He was placed on 3 L nasal cannula, and then transition to nonrebreather because he was mouth breathing.  He received several rounds of albuterol, then he went into A-fib with RVR.cardioversion was attempted, which briefly put him in sinus rhythm, but he transition back to A-fib with RVR.  He got 1 dose of IV metoprolol which helped a little, but he continued to have a rapid ventricular rate.  I was called to admit him due to the shortness of breath and A-fib.  He has no documented history of A-fib and is not on any anticoagulants.   Clinical Impression  Patient was agreeable to therapy. Performed all tasks assessed Mod I. Increased time to complete each task. SpO2 was monitored during session. At start SpO2 was at 93% on 4L of O2. During ambulation it held at 93% until the end when it dropped to 80%. At that moment ambulation was discontinued and SpO2 returned back up to 93% at conclusion of session. Patient was left in bed at conclusion of session. Plan:  Patient discharged from physical therapy to care of nursing for ambulation daily as tolerated for length of stay.         If plan is discharge home, recommend the following: A little help with walking and/or transfers;Assistance with cooking/housework;A little  help with bathing/dressing/bathroom;Help with stairs or ramp for entrance   Can travel by private vehicle        Equipment Recommendations None recommended by PT  Recommendations for Other Services       Functional Status Assessment Patient has had a recent decline in their functional status and demonstrates the ability to make significant improvements in function in a reasonable and predictable amount of time.     Precautions / Restrictions Precautions Precautions: None Restrictions Weight Bearing Restrictions Per Provider Order: No      Mobility  Bed Mobility Overal bed mobility: Modified Independent               Patient Response: Cooperative  Transfers Overall transfer level: Modified independent                      Ambulation/Gait Ambulation/Gait assistance: Modified independent (Device/Increase time) Gait Distance (Feet): 75 Feet Assistive device: None Gait Pattern/deviations: WFL(Within Functional Limits) Gait velocity: decreased     General Gait Details: slow, labored walking, pause for SOB due to COPD  Stairs            Wheelchair Mobility     Tilt Bed Tilt Bed Patient Response: Cooperative  Modified Rankin (Stroke Patients Only)       Balance Overall balance assessment: Modified Independent  Pertinent Vitals/Pain Pain Assessment Pain Assessment: No/denies pain    Home Living Family/patient expects to be discharged to:: Private residence Living Arrangements: Alone Available Help at Discharge: Family;Friend(s) Type of Home: House Home Access: Stairs to enter Entrance Stairs-Rails: None Entrance Stairs-Number of Steps: 3 Alternate Level Stairs-Number of Steps: 7 Home Layout: Two level Home Equipment: Agricultural consultant (2 wheels);Cane - single point      Prior Function Prior Level of Function : Independent/Modified Independent                      Extremity/Trunk Assessment   Upper Extremity Assessment Upper Extremity Assessment: Defer to OT evaluation    Lower Extremity Assessment Lower Extremity Assessment: Overall WFL for tasks assessed    Cervical / Trunk Assessment Cervical / Trunk Assessment: Normal  Communication   Communication Communication: No apparent difficulties    Cognition Arousal: Alert Behavior During Therapy: WFL for tasks assessed/performed   PT - Cognitive impairments: No apparent impairments                         Following commands: Intact       Cueing Cueing Techniques: Verbal cues, Tactile cues     General Comments      Exercises     Assessment/Plan    PT Assessment Patient does not need any further PT services  PT Problem List         PT Treatment Interventions      PT Goals (Current goals can be found in the Care Plan section)  Acute Rehab PT Goals Patient Stated Goal: to return home PT Goal Formulation: With patient Time For Goal Achievement: 08/31/23 Potential to Achieve Goals: Good    Frequency       Co-evaluation               AM-PAC PT "6 Clicks" Mobility  Outcome Measure Help needed turning from your back to your side while in a flat bed without using bedrails?: None Help needed moving from lying on your back to sitting on the side of a flat bed without using bedrails?: None Help needed moving to and from a bed to a chair (including a wheelchair)?: None Help needed standing up from a chair using your arms (e.g., wheelchair or bedside chair)?: None Help needed to walk in hospital room?: A Little Help needed climbing 3-5 steps with a railing? : A Little 6 Click Score: 22    End of Session   Activity Tolerance: Patient tolerated treatment well Patient left: in bed;with call bell/phone within reach Nurse Communication: Mobility status PT Visit Diagnosis: Unsteadiness on feet (R26.81);Other abnormalities of gait and mobility (R26.89);Muscle  weakness (generalized) (M62.81)    Time: 2956-2130 PT Time Calculation (min) (ACUTE ONLY): 27 min   Charges:   PT Evaluation $PT Eval Moderate Complexity: 1 Mod PT Treatments $Therapeutic Activity: 23-37 mins PT General Charges $$ ACUTE PT VISIT: 1 Visit         Keysi Oelkers SPT

## 2023-08-26 DIAGNOSIS — J449 Chronic obstructive pulmonary disease, unspecified: Secondary | ICD-10-CM | POA: Diagnosis not present

## 2023-08-27 DIAGNOSIS — R079 Chest pain, unspecified: Secondary | ICD-10-CM | POA: Diagnosis not present

## 2023-08-27 DIAGNOSIS — R0789 Other chest pain: Secondary | ICD-10-CM | POA: Diagnosis not present

## 2023-09-09 ENCOUNTER — Ambulatory Visit (HOSPITAL_COMMUNITY)
Admission: RE | Admit: 2023-09-09 | Discharge: 2023-09-09 | Disposition: A | Discharge status: Home / Self Care | Payer: Medicare Other | Source: Ambulatory Visit | Attending: Internal Medicine | Admitting: Internal Medicine

## 2023-09-09 ENCOUNTER — Encounter (HOSPITAL_COMMUNITY): Payer: Self-pay | Admitting: Internal Medicine

## 2023-09-09 VITALS — BP 140/80 | HR 72 | Ht 67.0 in | Wt 201.6 lb

## 2023-09-09 DIAGNOSIS — I10 Essential (primary) hypertension: Secondary | ICD-10-CM | POA: Insufficient documentation

## 2023-09-09 DIAGNOSIS — D6869 Other thrombophilia: Secondary | ICD-10-CM | POA: Insufficient documentation

## 2023-09-09 DIAGNOSIS — I4891 Unspecified atrial fibrillation: Secondary | ICD-10-CM

## 2023-09-09 DIAGNOSIS — I480 Paroxysmal atrial fibrillation: Secondary | ICD-10-CM | POA: Insufficient documentation

## 2023-09-09 DIAGNOSIS — Z7901 Long term (current) use of anticoagulants: Secondary | ICD-10-CM | POA: Diagnosis not present

## 2023-09-09 MED ORDER — APIXABAN 5 MG PO TABS: 5.0000 mg | ORAL_TABLET | Freq: Two times a day (BID) | ORAL | 4 refills | Status: DC

## 2023-09-09 MED ORDER — DILTIAZEM HCL ER COATED BEADS 180 MG PO CP24: 180.0000 mg | ORAL_CAPSULE | Freq: Every day | ORAL | 3 refills | Status: DC

## 2023-09-09 NOTE — Patient Instructions (Signed)
Increase cardizem to 180mg once a day 

## 2023-09-09 NOTE — Progress Notes (Signed)
 Primary Care Physician: Leanna Sato, MD Primary Cardiologist: Chrystie Nose, MD Electrophysiologist: None     Referring Physician: Dr. Everlene Balls [name] is a 78 y.o. male with a history of AAA s/p repair, CAD s/p CABG 2014, HTN, HLD, former tobacco use, COPD, and atrial fibrillation who presents for consultation in the [facility]. Hospital admission 2/8-11/25 for acute respiratory failure with hypoxia complicated by COPD and influenza. He is on baseline 2 L oxygen via nasal cannula prior to hospital admission but after discharge is currently on 4 L oxygen. Noted to have Afib with RVR; placed on diltiazem gtt and he had spontaneous conversion to NSR. Discharged on diltiazem 120 mg daily. Patient is on Eliquis 5 mg BID for a CHADS2VASC score of 4.  On evaluation today, he is currently in NSR. He has noted two episodes of Afib (lasted for couple of hours) since hospital discharge. He used his pulse oximeter to note the high heart rate and could feel palpitations. No missed doses of diltiazem or Eliquis. He is still grieving the loss of his wife.   Today, he denies symptoms of  chest pain, shortness of breath, orthopnea, PND, lower extremity edema, dizziness, presyncope, syncope, snoring, daytime somnolence, bleeding, or neurologic sequela. The patient is tolerating medications without difficulties and is otherwise without complaint today.    he has a BMI of Body mass index is 31.58 kg/m.Marland Kitchen Filed Weights   09/09/23 1504  Weight: 91.4 kg    Current Outpatient Medications  Medication Sig Dispense Refill   ADVAIR HFA 115-21 MCG/ACT inhaler Inhale 2 puffs into the lungs 2 (two) times daily.     albuterol (VENTOLIN HFA) 108 (90 Base) MCG/ACT inhaler Inhale 1-2 puffs into the lungs every 6 (six) hours as needed for wheezing or shortness of breath.     atorvastatin (LIPITOR) 40 MG tablet Take 40 mg by mouth daily.     Cholecalciferol (VITAMIN D) 125 MCG (5000  UT) CAPS Take by mouth.     clopidogrel (PLAVIX) 75 MG tablet Take 1 tablet (75 mg total) by mouth daily at 6 (six) AM. 30 tablet 10   guaiFENesin-codeine 100-10 MG/5ML syrup Take 5 mLs by mouth every 6 (six) hours as needed for cough.     Multiple Vitamins-Minerals (PRESERVISION AREDS 2) CAPS Take 1 capsule by mouth in the morning and at bedtime.     SPIRIVA HANDIHALER 18 MCG inhalation capsule Place 18 mcg into inhaler and inhale daily.     apixaban (ELIQUIS) 5 MG TABS tablet Take 1 tablet (5 mg total) by mouth 2 (two) times daily. 60 tablet 4   diltiazem (CARDIZEM CD) 180 MG 24 hr capsule Take 1 capsule (180 mg total) by mouth daily. 30 capsule 3   No current facility-administered medications for this encounter.    Atrial Fibrillation Management history:  Previous antiarrhythmic drugs: none Previous cardioversions: none Previous ablations: none Anticoagulation history: Eliquis   ROS- All systems are reviewed and negative except as per the HPI above.  Physical Exam: BP (!) 140/80   Pulse 72   Ht 5\' 7"  (1.702 m)   Wt 91.4 kg   BMI 31.58 kg/m   GEN: Well nourished, well developed in no acute distress NECK: No JVD; No carotid bruits CARDIAC: Regular rate and rhythm, no murmurs, rubs, gallops RESPIRATORY:  Clear to auscultation without rales, wheezing or rhonchi  ABDOMEN: Soft, non-tender, non-distended EXTREMITIES:  No edema; No deformity  EKG today demonstrates  Vent. rate 72 BPM PR interval 192 ms QRS duration 90 ms QT/QTcB 374/409 ms P-R-T axes 21 111 77 Normal sinus rhythm Right axis deviation Septal infarct , age undetermined Abnormal ECG When compared with ECG of 21-Aug-2023 11:32, PREVIOUS ECG IS PRESENT  Echo 08/24/23 demonstrated  1. Left ventricular ejection fraction, by estimation, is 60 to 65%. The  left ventricle has normal function. The left ventricle has no regional  wall motion abnormalities. Left ventricular diastolic parameters are  consistent  with Grade I diastolic  dysfunction (impaired relaxation). Elevated left ventricular end-diastolic  pressure.   2. Right ventricular systolic function is normal. The right ventricular  size is mildly enlarged. Tricuspid regurgitation signal is inadequate for  assessing PA pressure.   3. Left atrial size was mildly dilated.   4. The mitral valve is abnormal. No evidence of mitral valve  regurgitation. No evidence of mitral stenosis. Severe mitral annular  calcification.   5. The aortic valve is tricuspid. There is moderate calcification of the  aortic valve. Aortic valve regurgitation is not visualized. Mild aortic  valve stenosis. Aortic valve area, by VTI measures 2.12 cm. Aortic valve  mean gradient measures 11.0 mmHg.  Aortic valve Vmax measures 2.37 m/s.   6. Aortic dilatation noted. There is mild dilatation of the aortic root,  measuring 40 mm. Normal size of ascending aorta.   7. The inferior vena cava is normal in size with greater than 50%  respiratory variability, suggesting right atrial pressure of 3 mmHg.    ASSESSMENT & PLAN CHA2DS2-VASc Score = 4  The patient's score is based upon: CHF History: 0 HTN History: 1 Diabetes History: 0 Stroke History: 0 Vascular Disease History: 1 Age Score: 2 Gender Score: 0       ASSESSMENT AND PLAN: Paroxysmal Atrial Fibrillation (ICD10:  I48.0) The patient's CHA2DS2-VASc score is 4, indicating a 4.8% annual risk of stroke.    He is currently in NSR. Education provided about Afib. Discussion about triggers and stress related to recent loss of wife. Due to him noting 2 episodes of Afib since hospital discharge, will increase diltiazem to 180 mg daily. After discussion, we will proceed with conservative observation at this time. Rhythm monitoring device recommended.    Secondary Hypercoagulable State (ICD10:  D68.69) The patient is at significant risk for stroke/thromboembolism based upon his CHA2DS2-VASc Score of 4.  Continue  Apixaban (Eliquis).  Continue without interruption. Will draw CBC in 1 month.      Follow up 1 month Afib clinic.    Lake Bells, PA-C  Afib Clinic [facility] [address] [place], [place] [address] [phone]

## 2023-09-16 DIAGNOSIS — J449 Chronic obstructive pulmonary disease, unspecified: Secondary | ICD-10-CM | POA: Diagnosis not present

## 2023-09-17 DIAGNOSIS — J449 Chronic obstructive pulmonary disease, unspecified: Secondary | ICD-10-CM | POA: Diagnosis not present

## 2023-09-20 DIAGNOSIS — J45901 Unspecified asthma with (acute) exacerbation: Secondary | ICD-10-CM | POA: Diagnosis not present

## 2023-09-24 ENCOUNTER — Telehealth (INDEPENDENT_AMBULATORY_CARE_PROVIDER_SITE_OTHER): Payer: Self-pay

## 2023-09-24 DIAGNOSIS — Z8709 Personal history of other diseases of the respiratory system: Secondary | ICD-10-CM | POA: Diagnosis not present

## 2023-09-24 DIAGNOSIS — J9621 Acute and chronic respiratory failure with hypoxia: Secondary | ICD-10-CM | POA: Diagnosis not present

## 2023-09-24 DIAGNOSIS — Z9289 Personal history of other medical treatment: Secondary | ICD-10-CM | POA: Diagnosis not present

## 2023-09-24 DIAGNOSIS — I4891 Unspecified atrial fibrillation: Secondary | ICD-10-CM | POA: Diagnosis not present

## 2023-09-24 NOTE — Telephone Encounter (Signed)
 He should continue with eliquis

## 2023-09-24 NOTE — Telephone Encounter (Signed)
 Left voice mail

## 2023-09-24 NOTE — Telephone Encounter (Addendum)
 Scott called from [facility] pharmacy statin that the patient is currently taking 10 mg of Plavix 1 time a day from [facility], but he was recently seen at the ED for Afib and was place on Eliquis 5 mg 2 times a day. The pharmacist would like to know which should he keep taking Plavix or Eliquis.   Please advise   Okay to leave voicemail

## 2023-10-04 DIAGNOSIS — J449 Chronic obstructive pulmonary disease, unspecified: Secondary | ICD-10-CM | POA: Diagnosis not present

## 2023-10-07 ENCOUNTER — Ambulatory Visit (HOSPITAL_COMMUNITY)
Admission: RE | Admit: 2023-10-07 | Discharge: 2023-10-07 | Disposition: A | Discharge status: Home / Self Care | Payer: Medicare Other | Source: Ambulatory Visit | Attending: Internal Medicine | Admitting: Internal Medicine

## 2023-10-07 VITALS — BP 160/84 | HR 79 | Ht 67.0 in | Wt 194.6 lb

## 2023-10-07 DIAGNOSIS — Z951 Presence of aortocoronary bypass graft: Secondary | ICD-10-CM | POA: Diagnosis not present

## 2023-10-07 DIAGNOSIS — Z87891 Personal history of nicotine dependence: Secondary | ICD-10-CM | POA: Insufficient documentation

## 2023-10-07 DIAGNOSIS — Z7901 Long term (current) use of anticoagulants: Secondary | ICD-10-CM | POA: Insufficient documentation

## 2023-10-07 DIAGNOSIS — I452 Bifascicular block: Secondary | ICD-10-CM | POA: Insufficient documentation

## 2023-10-07 DIAGNOSIS — Z7952 Long term (current) use of systemic steroids: Secondary | ICD-10-CM | POA: Diagnosis not present

## 2023-10-07 DIAGNOSIS — D6869 Other thrombophilia: Secondary | ICD-10-CM

## 2023-10-07 DIAGNOSIS — I2510 Atherosclerotic heart disease of native coronary artery without angina pectoris: Secondary | ICD-10-CM | POA: Insufficient documentation

## 2023-10-07 DIAGNOSIS — Z634 Disappearance and death of family member: Secondary | ICD-10-CM | POA: Diagnosis not present

## 2023-10-07 DIAGNOSIS — E785 Hyperlipidemia, unspecified: Secondary | ICD-10-CM | POA: Insufficient documentation

## 2023-10-07 DIAGNOSIS — I480 Paroxysmal atrial fibrillation: Secondary | ICD-10-CM

## 2023-10-07 DIAGNOSIS — Z79899 Other long term (current) drug therapy: Secondary | ICD-10-CM | POA: Insufficient documentation

## 2023-10-07 DIAGNOSIS — J449 Chronic obstructive pulmonary disease, unspecified: Secondary | ICD-10-CM | POA: Insufficient documentation

## 2023-10-07 DIAGNOSIS — Z9981 Dependence on supplemental oxygen: Secondary | ICD-10-CM | POA: Insufficient documentation

## 2023-10-07 DIAGNOSIS — I10 Essential (primary) hypertension: Secondary | ICD-10-CM | POA: Insufficient documentation

## 2023-10-07 DIAGNOSIS — Z7951 Long term (current) use of inhaled steroids: Secondary | ICD-10-CM | POA: Insufficient documentation

## 2023-10-07 LAB — CBC
HCT: 45.2 % (ref 39.0–52.0)
Hemoglobin: 14.9 g/dL (ref 13.0–17.0)
MCH: 30.2 pg (ref 26.0–34.0)
MCHC: 33 g/dL (ref 30.0–36.0)
MCV: 91.7 fL (ref 80.0–100.0)
Platelets: 234 10*3/uL (ref 150–400)
RBC: 4.93 MIL/uL (ref 4.22–5.81)
RDW: 13.5 % (ref 11.5–15.5)
WBC: 15.7 10*3/uL — ABNORMAL HIGH (ref 4.0–10.5)
nRBC: 0 % (ref 0.0–0.2)

## 2023-10-07 MED ORDER — DILTIAZEM HCL ER COATED BEADS 180 MG PO CP24: 180.0000 mg | ORAL_CAPSULE | Freq: Every day | ORAL | 5 refills | Status: DC

## 2023-10-07 MED ORDER — APIXABAN 5 MG PO TABS: 5.0000 mg | ORAL_TABLET | Freq: Two times a day (BID) | ORAL | 4 refills | Status: AC

## 2023-10-07 NOTE — Progress Notes (Addendum)
 Primary Care Physician: Leanna Sato, MD Primary Cardiologist: Chrystie Nose, MD Electrophysiologist: None     Referring Physician: Dr. Everlene Balls [name] is a 78 y.o. male with a history of AAA s/p repair, CAD s/p CABG 2014, HTN, HLD, former tobacco use, COPD, and atrial fibrillation who presents for consultation in the [facility]. Hospital admission 2/8-11/25 for acute respiratory failure with hypoxia complicated by COPD and influenza. He is on baseline 2 L oxygen via nasal cannula prior to hospital admission but after discharge is currently on 4 L oxygen. Noted to have Afib with RVR; placed on diltiazem gtt and he had spontaneous conversion to NSR. Discharged on diltiazem 120 mg daily. Patient is on Eliquis 5 mg BID for a CHADS2VASC score of 4. He notes acute stress due to recent loss of wife.   On follow up 10/07/23, he is currently in NSR. Patient notes he is doing much better compared to last visit. His breathing has improved and he is now down to 2 L oxygen. He is no longer having to use oxygen during the day; using 2 L oxygen at bedtime which is baseline. He notes his BP has been higher since being on the decadron, systolic 140-150s at home. He is doing a slow wean on decadron currently on 4 mg going down by 1 mg over the next 3 months. He would like to establish with a cardiologist in [place] closer to home. No bleeding issues on Eliquis.   Today, he denies symptoms of  chest pain, shortness of breath, orthopnea, PND, lower extremity edema, dizziness, presyncope, syncope, snoring, daytime somnolence, bleeding, or neurologic sequela. The patient is tolerating medications without difficulties and is otherwise without complaint today.    he has a BMI of Body mass index is 30.48 kg/m.Marland Kitchen Filed Weights   10/07/23 1500  Weight: 88.3 kg     Current Outpatient Medications  Medication Sig Dispense Refill   ADVAIR HFA 115-21 MCG/ACT inhaler Inhale 2  puffs into the lungs 2 (two) times daily.     albuterol (VENTOLIN HFA) 108 (90 Base) MCG/ACT inhaler Inhale 1-2 puffs into the lungs every 6 (six) hours as needed for wheezing or shortness of breath.     atorvastatin (LIPITOR) 40 MG tablet Take 40 mg by mouth daily.     budesonide (PULMICORT) 0.5 MG/2ML nebulizer solution Inhale 0.5 mg into the lungs daily.     Cholecalciferol (VITAMIN D) 125 MCG (5000 UT) CAPS Take 1 tablet by mouth every morning.     clopidogrel (PLAVIX) 75 MG tablet Take 1 tablet (75 mg total) by mouth daily at 6 (six) AM. 30 tablet 10   dexamethasone (DECADRON) 1 MG tablet Taking 4 pills by mouth daily     Multiple Vitamins-Minerals (PRESERVISION AREDS 2) CAPS Take 1 capsule by mouth in the morning and at bedtime.     SPIRIVA HANDIHALER 18 MCG inhalation capsule Place 18 mcg into inhaler and inhale daily.     torsemide (DEMADEX) 20 MG tablet Take 20 mg by mouth every other day.     apixaban (ELIQUIS) 5 MG TABS tablet Take 1 tablet (5 mg total) by mouth 2 (two) times daily. 60 tablet 4   diltiazem (CARDIZEM CD) 180 MG 24 hr capsule Take 1 capsule (180 mg total) by mouth daily. 30 capsule 5   No current facility-administered medications for this encounter.    Atrial Fibrillation Management history:  Previous antiarrhythmic drugs: none  Previous cardioversions: none Previous ablations: none Anticoagulation history: Eliquis   ROS- All systems are reviewed and negative except as per the HPI above.  Physical Exam: BP (!) 160/84   Pulse 79   Ht 5\' 7"  (1.702 m)   Wt 88.3 kg   BMI 30.48 kg/m   GEN- The patient is well appearing, alert and oriented x 3 today.   Neck - no JVD or carotid bruit noted Lungs- Clear to ausculation bilaterally, normal work of breathing Heart- Regular rate and rhythm, no murmurs, rubs or gallops, PMI not laterally displaced Extremities- no clubbing, cyanosis, or edema Skin - no rash or ecchymosis noted   EKG today demonstrates  Vent. rate  79 BPM PR interval 180 ms QRS duration 92 ms QT/QTcB 374/428 ms P-R-T axes 55 119 70 Normal sinus rhythm Left posterior fasicular block Incomplete right bundle branch block Septal infarct , age undetermined Abnormal ECG No significant change since last tracing Confirmed by Olga Millers ([id number]) on 10/07/2023 3:25:35 PM  Echo 08/24/23 demonstrated  1. Left ventricular ejection fraction, by estimation, is 60 to 65%. The  left ventricle has normal function. The left ventricle has no regional  wall motion abnormalities. Left ventricular diastolic parameters are  consistent with Grade I diastolic  dysfunction (impaired relaxation). Elevated left ventricular end-diastolic  pressure.   2. Right ventricular systolic function is normal. The right ventricular  size is mildly enlarged. Tricuspid regurgitation signal is inadequate for  assessing PA pressure.   3. Left atrial size was mildly dilated.   4. The mitral valve is abnormal. No evidence of mitral valve  regurgitation. No evidence of mitral stenosis. Severe mitral annular  calcification.   5. The aortic valve is tricuspid. There is moderate calcification of the  aortic valve. Aortic valve regurgitation is not visualized. Mild aortic  valve stenosis. Aortic valve area, by VTI measures 2.12 cm. Aortic valve  mean gradient measures 11.0 mmHg.  Aortic valve Vmax measures 2.37 m/s.   6. Aortic dilatation noted. There is mild dilatation of the aortic root,  measuring 40 mm. Normal size of ascending aorta.   7. The inferior vena cava is normal in size with greater than 50%  respiratory variability, suggesting right atrial pressure of 3 mmHg.    ASSESSMENT & PLAN CHA2DS2-VASc Score = 4  The patient's score is based upon: CHF History: 0 HTN History: 1 Diabetes History: 0 Stroke History: 0 Vascular Disease History: 1 Age Score: 2 Gender Score: 0       ASSESSMENT AND PLAN: Paroxysmal Atrial Fibrillation (ICD10:  I48.0) The  patient's CHA2DS2-VASc score is 4, indicating a 4.8% annual risk of stroke.    He is currently in NSR. Continue diltiazem 180 mg daily. He declines titration for now as he suspect BP will improve as he continues decadron wean. His BP was in the 130s previously at home. He would like to establish care with cardiology in [place] and at this time declines f/u in Afib clinic unless he has more Afib in the future. This is reasonable given he'd like to stay closer to home. Okay to transition diltiazem and Eliquis to new cardiologist.  Secondary Hypercoagulable State (ICD10:  [phone]) The patient is at significant risk for stroke/thromboembolism based upon his CHA2DS2-VASc Score of 4.  Continue Apixaban (Eliquis).  Continue Eliquis 5 mg BID without interruption. CBC drawn today.      Follow up Afib clinic prn.    Lake Bells, PA-C  Afib Clinic [facility] [address]  [address] [place], [place] [address] [phone]

## 2023-10-17 DIAGNOSIS — J449 Chronic obstructive pulmonary disease, unspecified: Secondary | ICD-10-CM | POA: Diagnosis not present

## 2023-10-21 DIAGNOSIS — J45901 Unspecified asthma with (acute) exacerbation: Secondary | ICD-10-CM | POA: Diagnosis not present

## 2023-10-27 ENCOUNTER — Telehealth (INDEPENDENT_AMBULATORY_CARE_PROVIDER_SITE_OTHER): Payer: Self-pay

## 2023-10-27 NOTE — Telephone Encounter (Deleted)
error 

## 2023-10-27 NOTE — Telephone Encounter (Signed)
 Patient left a message stating that he has been taking Clopidogrel 75 mg. Patient was prescribed Eliquis by different provider while in the hospital. Patient was notified that he should take Eliquis per [facility] NP.

## 2023-11-04 DIAGNOSIS — J449 Chronic obstructive pulmonary disease, unspecified: Secondary | ICD-10-CM | POA: Diagnosis not present

## 2023-11-15 ENCOUNTER — Other Ambulatory Visit: Payer: Self-pay | Admitting: Pulmonary Disease

## 2023-11-15 DIAGNOSIS — Z8709 Personal history of other diseases of the respiratory system: Secondary | ICD-10-CM

## 2023-11-15 DIAGNOSIS — J9621 Acute and chronic respiratory failure with hypoxia: Secondary | ICD-10-CM

## 2023-11-16 DIAGNOSIS — J449 Chronic obstructive pulmonary disease, unspecified: Secondary | ICD-10-CM | POA: Diagnosis not present

## 2023-11-20 DIAGNOSIS — J45901 Unspecified asthma with (acute) exacerbation: Secondary | ICD-10-CM | POA: Diagnosis not present

## 2023-11-24 ENCOUNTER — Ambulatory Visit
Admission: RE | Admit: 2023-11-24 | Discharge: 2023-11-24 | Disposition: A | Discharge status: Home / Self Care | Source: Ambulatory Visit | Attending: Pulmonary Disease | Admitting: Pulmonary Disease

## 2023-11-24 DIAGNOSIS — J9621 Acute and chronic respiratory failure with hypoxia: Secondary | ICD-10-CM | POA: Insufficient documentation

## 2023-11-24 DIAGNOSIS — Z8709 Personal history of other diseases of the respiratory system: Secondary | ICD-10-CM | POA: Diagnosis not present

## 2023-12-03 ENCOUNTER — Other Ambulatory Visit (HOSPITAL_COMMUNITY)
Admission: RE | Admit: 2023-12-03 | Discharge: 2023-12-03 | Disposition: A | Discharge status: Home / Self Care | Source: Ambulatory Visit | Attending: Medical | Admitting: Medical

## 2023-12-03 ENCOUNTER — Ambulatory Visit: Attending: Medical | Admitting: Medical

## 2023-12-03 ENCOUNTER — Other Ambulatory Visit (HOSPITAL_COMMUNITY): Payer: Self-pay | Admitting: Internal Medicine

## 2023-12-03 ENCOUNTER — Encounter: Payer: Self-pay | Admitting: Medical

## 2023-12-03 VITALS — BP 150/68 | HR 54 | Ht 67.0 in | Wt 202.0 lb

## 2023-12-03 DIAGNOSIS — E785 Hyperlipidemia, unspecified: Secondary | ICD-10-CM

## 2023-12-03 DIAGNOSIS — Z79899 Other long term (current) drug therapy: Secondary | ICD-10-CM | POA: Diagnosis not present

## 2023-12-03 DIAGNOSIS — I2510 Atherosclerotic heart disease of native coronary artery without angina pectoris: Secondary | ICD-10-CM

## 2023-12-03 DIAGNOSIS — R600 Localized edema: Secondary | ICD-10-CM

## 2023-12-03 DIAGNOSIS — I480 Paroxysmal atrial fibrillation: Secondary | ICD-10-CM | POA: Diagnosis not present

## 2023-12-03 DIAGNOSIS — Z8679 Personal history of other diseases of the circulatory system: Secondary | ICD-10-CM

## 2023-12-03 DIAGNOSIS — Z9889 Other specified postprocedural states: Secondary | ICD-10-CM

## 2023-12-03 LAB — BASIC METABOLIC PANEL WITH GFR
Anion gap: 7 (ref 5–15)
BUN: 25 mg/dL — ABNORMAL HIGH (ref 8–23)
CO2: 31 mmol/L (ref 22–32)
Calcium: 9 mg/dL (ref 8.9–10.3)
Chloride: 95 mmol/L — ABNORMAL LOW (ref 98–111)
Creatinine, Ser: 0.99 mg/dL (ref 0.61–1.24)
GFR, Estimated: >60 mL/min (ref >60)
Glucose, Bld: 105 mg/dL — ABNORMAL HIGH (ref 70–99)
Potassium: 4.3 mmol/L (ref 3.5–5.1)
Sodium: 133 mmol/L — ABNORMAL LOW (ref 135–145)

## 2023-12-03 LAB — LIPID PANEL
Cholesterol: 144 mg/dL (ref 0–200)
HDL: 66 mg/dL (ref >40)
LDL Cholesterol: 70 mg/dL (ref 0–99)
Total CHOL/HDL Ratio: 2.2 ratio
Triglycerides: 38 mg/dL (ref <150)
VLDL: 8 mg/dL (ref 0–40)

## 2023-12-03 LAB — CBC
HCT: 40.9 % (ref 39.0–52.0)
Hemoglobin: 13.8 g/dL (ref 13.0–17.0)
MCH: 32.5 pg (ref 26.0–34.0)
MCHC: 33.7 g/dL (ref 30.0–36.0)
MCV: 96.5 fL (ref 80.0–100.0)
Platelets: 183 10*3/uL (ref 150–400)
RBC: 4.24 MIL/uL (ref 4.22–5.81)
RDW: 14.6 % (ref 11.5–15.5)
WBC: 11.7 10*3/uL — ABNORMAL HIGH (ref 4.0–10.5)
nRBC: 0 % (ref 0.0–0.2)

## 2023-12-03 LAB — BRAIN NATRIURETIC PEPTIDE: B Natriuretic Peptide: 121 pg/mL — ABNORMAL HIGH (ref 0.0–100.0)

## 2023-12-03 MED ORDER — TORSEMIDE 20 MG PO TABS: ORAL_TABLET | ORAL | 3 refills | Status: DC

## 2023-12-03 NOTE — Patient Instructions (Signed)
 Medication Instructions:   Increase Torsemide to 40 mg Daily for the next 3 Days Then Decrease back to 20 mg Daily   *If you need a refill on your cardiac medications before your next appointment, please call your pharmacy*  Lab Work: Your physician recommends that you return for lab work in: Today ( Lipid, CBC, BMP, BNP)   Your physician recommends that you return for lab work in: 2 Weeks ( BMP)   If you have labs (blood work) drawn today and your tests are completely normal, you will receive your results only by: MyChart Message (if you have MyChart) OR A paper copy in the mail If you have any lab test that is abnormal or we need to change your treatment, we will call you to review the results.  Testing/Procedures: NONE   Follow-Up: At [facility], you and your health needs are our priority.  As part of our continuing mission to provide you with exceptional heart care, our providers are all part of one team.  This team includes your primary Cardiologist (physician) and Advanced Practice Providers or APPs (Physician Assistants and Nurse Practitioners) who all work together to provide you with the care you need, when you need it.  Your next appointment:   1 month(s)  Provider:   You may see Hazle Lites, MD or one of the following Advanced Practice Providers on your designated Care Team:   Woodfin Hays, PA-C  [place], [place] Theotis Flake, [place]     We recommend signing up for the patient portal called "MyChart".  Sign up information is provided on this After Visit Summary.  MyChart is used to connect with patients for Virtual Visits (Telemedicine).  Patients are able to view lab/test results, encounter notes, upcoming appointments, etc.  Non-urgent messages can be sent to your provider as well.   To learn more about what you can do with MyChart, go to [email or web address].   Other Instructions Thank you for choosing [facility]!

## 2023-12-03 NOTE — Progress Notes (Signed)
 . Cardiology Office Note:  .   Date:  12/03/2023  ID:  GERADO NABERS, DOB 09-12-1945, MRN [id number] PCP: Macie Saxon, MD  [facility] Providers Cardiologist:  Hazle Lites, MD     History of Present Illness: Aaron Aas   [name] is a 78 y.o. male with a h/o CAD s/p CABG, HTN, HLD, Paroxysmal Afib, tobacco use, AAA s/p repair 03/2021 who presents for follow-up.   He was initially evaluated in 2004 with cardiac cath with chronic occlusion of the RCA and L>R collaterals with insignificant disease in the left cornary system. He was treated medcially.   He had repeat cath April 2014 showing left main and severe 3V CAD with preserved EF. He underwent CABG with LIMA to LAD, SVG to OM, SVG to PDA.   He was admitted 08/2023 with COPD exacerbation, Flu, and new onset Afib.  He underwent DCCV with conversion to NSR. Echo showed LVEF 60-65%.  He was started on Eliquis  5mg  BID and referred to the Afib clinic.  He was last seen 09/2023 by the Afib clinic. He was in NSR.   Today, the patient reports lower leg edema that started after the hospitalization in  08/2023 for COPD/Flu/PAF. He is currently on steroids until June 12th.  He has been taking torsemide 20mg  every other day. He wears compression socks. He denies chest pain or SOB. He has been doing walking, this has helped him feel better. He wear O2 at night only.   Studies Reviewed: Aaron Aas   EKG Interpretation Date/Time:  Friday Dec 03 2023 14:05:40 EDT Ventricular Rate:  56 PR Interval:  192 QRS Duration:  92 QT Interval:  426 QTC Calculation: 411 R Axis:   110  Text Interpretation: Sinus bradycardia Right axis deviation Incomplete right bundle branch block Septal infarct (cited on or before 09-Sep-2023) When compared with ECG of 07-Oct-2023 15:04, Nonspecific T wave abnormality now evident in Lateral leads Confirmed by Gennaro Khat, Raelan Burgoon ([id number]) on 12/03/2023 2:07:29 PM    Echo 08/2023  1. Left ventricular ejection fraction, by estimation, is 60  to 65%. The  left ventricle has normal function. The left ventricle has no regional  wall motion abnormalities. Left ventricular diastolic parameters are  consistent with Grade I diastolic  dysfunction (impaired relaxation). Elevated left ventricular end-diastolic  pressure.   2. Right ventricular systolic function is normal. The right ventricular  size is mildly enlarged. Tricuspid regurgitation signal is inadequate for  assessing PA pressure.   3. Left atrial size was mildly dilated.   4. The mitral valve is abnormal. No evidence of mitral valve  regurgitation. No evidence of mitral stenosis. Severe mitral annular  calcification.   5. The aortic valve is tricuspid. There is moderate calcification of the  aortic valve. Aortic valve regurgitation is not visualized. Mild aortic  valve stenosis. Aortic valve area, by VTI measures 2.12 cm. Aortic valve  mean gradient measures 11.0 mmHg.  Aortic valve Vmax measures 2.37 m/s.   6. Aortic dilatation noted. There is mild dilatation of the aortic root,  measuring 40 mm. Normal size of ascending aorta.   7. The inferior vena cava is normal in size with greater than 50%  respiratory variability, suggesting right atrial pressure of 3 mmHg.   Comparison(s): No prior Echocardiogram.        Physical Exam:   VS:  BP (!) 150/68 (BP Location: Left Arm, Patient Position: Sitting, Cuff Size: Normal)   Pulse (!) 54   Ht 5'  7" (1.702 m)   Wt 202 lb (91.6 kg)   SpO2 93%   BMI 31.64 kg/m    Wt Readings from Last 3 Encounters:  12/03/23 202 lb (91.6 kg)  10/07/23 194 lb 9.6 oz (88.3 kg)  09/09/23 201 lb 9.6 oz (91.4 kg)    GEN: Well nourished, well developed in no acute distress NECK: No JVD; No carotid bruits CARDIAC: RRR, + murmurs, no rubs, gallops RESPIRATORY:  Clear to auscultation without rales, wheezing or rhonchi  ABDOMEN: Soft, non-tender, non-distended EXTREMITIES:  1+ lower leg edema; No deformity   ASSESSMENT AND PLAN: .     LLE HFpEF The patient reports lower leg edema since COPD/Flu/Afib hospitalization in 08/2023. He has been on steroids however, and PCP started Torsemide 20mg  every other day. On exam he has lower leg edema, no JVD. He reports breathing is normal. Echo in 08/2023 showed LVEF 60-65%, no WMA, G1DD, severe mitral calcification, mild AS with mild aortic root dilation.  I will check BMET, CBC, BNP. I will increase Torsemide 40mg  daily x 3 days, then down to 20mg  daily. BMET in 2 weeks. He will be done with steroids June 12th and suspect swelling will improve after this.   Paroxysmal Afib EKG today shows SB, 56bpm. Continue Eliquis  5mg  BID for stroke ppx. Continue Diltiazem  180mg  daily for rate control. Continue monitor HR.  CAD s/p CABG in 2014 No chest pain reported. He has been walking with no issues. No ASA/Plavix  with Eliquis . Continue Lipitor 40mg  daily.    HTN BP high in the setting of extra volume. Will reassess at follow-up. Continue diltiazem  180mg  daily.   HLD I will update a lipid panel. Continue Lipitor 40mg  daily.   AAA s/p repair 03/2021 [facility]  2024 showed no evidence of endoleak, EVAR increased in size. Monitored by VS.      Dispo: Follow-up in 1 month  Signed, Florabel Faulks Rebekah Canada, PA-C

## 2023-12-04 DIAGNOSIS — J449 Chronic obstructive pulmonary disease, unspecified: Secondary | ICD-10-CM | POA: Diagnosis not present

## 2023-12-07 ENCOUNTER — Ambulatory Visit: Payer: Self-pay | Admitting: Medical

## 2023-12-07 ENCOUNTER — Telehealth: Payer: Self-pay | Admitting: Medical

## 2023-12-07 MED ORDER — TORSEMIDE 20 MG PO TABS
ORAL_TABLET | ORAL | 3 refills | Status: DC
Start: 2023-12-07 — End: 2023-12-30

## 2023-12-07 NOTE — Telephone Encounter (Signed)
 Pt called office and stated that his torsemide  that was called into [facility] on Friday did not go through but he would like for it to be sent to [facility] in [place]. [phone] is the best number to reach the pt.

## 2023-12-07 NOTE — Telephone Encounter (Signed)
 Refill resent to [place] clinic.

## 2023-12-16 NOTE — Addendum Note (Signed)
 Addended by: Arcadia Gorgas G on: 12/16/2023 05:20 PM   Modules accepted: Orders

## 2023-12-17 ENCOUNTER — Other Ambulatory Visit (HOSPITAL_COMMUNITY)
Admission: RE | Admit: 2023-12-17 | Discharge: 2023-12-17 | Disposition: A | Discharge status: Home / Self Care | Source: Ambulatory Visit | Attending: Medical | Admitting: Medical

## 2023-12-17 DIAGNOSIS — Z79899 Other long term (current) drug therapy: Secondary | ICD-10-CM | POA: Insufficient documentation

## 2023-12-17 DIAGNOSIS — J449 Chronic obstructive pulmonary disease, unspecified: Secondary | ICD-10-CM | POA: Diagnosis not present

## 2023-12-17 LAB — BASIC METABOLIC PANEL WITH GFR
Anion gap: 11 (ref 5–15)
BUN: 22 mg/dL (ref 8–23)
CO2: 37 mmol/L — ABNORMAL HIGH (ref 22–32)
Calcium: 8.9 mg/dL (ref 8.9–10.3)
Chloride: 87 mmol/L — ABNORMAL LOW (ref 98–111)
Creatinine, Ser: 1.15 mg/dL (ref 0.61–1.24)
GFR, Estimated: >60 mL/min (ref >60)
Glucose, Bld: 109 mg/dL — ABNORMAL HIGH (ref 70–99)
Potassium: 3.5 mmol/L (ref 3.5–5.1)
Sodium: 135 mmol/L (ref 135–145)

## 2023-12-21 DIAGNOSIS — J45901 Unspecified asthma with (acute) exacerbation: Secondary | ICD-10-CM | POA: Diagnosis not present

## 2023-12-23 DIAGNOSIS — R0902 Hypoxemia: Secondary | ICD-10-CM | POA: Diagnosis not present

## 2023-12-23 DIAGNOSIS — Z9981 Dependence on supplemental oxygen: Secondary | ICD-10-CM | POA: Diagnosis not present

## 2023-12-23 DIAGNOSIS — J449 Chronic obstructive pulmonary disease, unspecified: Secondary | ICD-10-CM | POA: Diagnosis not present

## 2023-12-23 DIAGNOSIS — J432 Centrilobular emphysema: Secondary | ICD-10-CM | POA: Diagnosis not present

## 2023-12-23 DIAGNOSIS — R918 Other nonspecific abnormal finding of lung field: Secondary | ICD-10-CM | POA: Diagnosis not present

## 2023-12-30 ENCOUNTER — Ambulatory Visit: Attending: Student | Admitting: Student

## 2023-12-30 ENCOUNTER — Encounter: Payer: Self-pay | Admitting: Student

## 2023-12-30 VITALS — BP 132/68 | HR 68 | Ht 67.0 in | Wt 200.2 lb

## 2023-12-30 DIAGNOSIS — Z9889 Other specified postprocedural states: Secondary | ICD-10-CM

## 2023-12-30 DIAGNOSIS — I10 Essential (primary) hypertension: Secondary | ICD-10-CM

## 2023-12-30 DIAGNOSIS — I2510 Atherosclerotic heart disease of native coronary artery without angina pectoris: Secondary | ICD-10-CM | POA: Diagnosis not present

## 2023-12-30 DIAGNOSIS — R600 Localized edema: Secondary | ICD-10-CM

## 2023-12-30 DIAGNOSIS — I480 Paroxysmal atrial fibrillation: Secondary | ICD-10-CM

## 2023-12-30 DIAGNOSIS — I350 Nonrheumatic aortic (valve) stenosis: Secondary | ICD-10-CM

## 2023-12-30 DIAGNOSIS — Z79899 Other long term (current) drug therapy: Secondary | ICD-10-CM

## 2023-12-30 DIAGNOSIS — Z8679 Personal history of other diseases of the circulatory system: Secondary | ICD-10-CM

## 2023-12-30 DIAGNOSIS — E785 Hyperlipidemia, unspecified: Secondary | ICD-10-CM

## 2023-12-30 MED ORDER — TORSEMIDE 20 MG PO TABS: ORAL_TABLET | ORAL | 11 refills | Status: DC

## 2023-12-30 NOTE — Patient Instructions (Signed)
 Medication Instructions:   Increase Torsemide  to 20 mg alternating with 40 mg Daily   *If you need a refill on your cardiac medications before your next appointment, please call your pharmacy*  Lab Work: Your physician recommends that you return for lab work in 1-2 Weeks   If you have labs (blood work) drawn today and your tests are completely normal, you will receive your results only by: [employer] (if you have MyChart) OR A paper copy in the mail If you have any lab test that is abnormal or we need to change your treatment, we will call you to review the results.  Testing/Procedures: NONE   Follow-Up: At [facility], you and your health needs are our priority.  As part of our continuing mission to provide you with exceptional heart care, our providers are all part of one team.  This team includes your primary Cardiologist (physician) and Advanced Practice Providers or APPs (Physician Assistants and Nurse Practitioners) who all work together to provide you with the care you need, when you need it.  Your next appointment:   3 month(s)  Provider:   Woodfin Hays, PA-C    We recommend signing up for the patient portal called MyChart.  Sign up information is provided on this After Visit Summary.  MyChart is used to connect with patients for Virtual Visits (Telemedicine).  Patients are able to view lab/test results, encounter notes, upcoming appointments, etc.  Non-urgent messages can be sent to your provider as well.   To learn more about what you can do with MyChart, go to [email or web address].   Other Instructions Thank you for choosing [facility]!

## 2023-12-30 NOTE — Progress Notes (Signed)
 Cardiology Office Note    Date:  12/30/2023  ID:  [name], DOB [birth year], MRN [id number] Cardiologist: Hazle Lites, MD  --> patient requests to follow-up in [facility]  History of Present Illness:    [name] is a 78 y.o. male past medical history of CAD (s/p CABG in 2014 with LIMA to LAD, SVG to OM and SVG to PDA), persistent atrial fibrillation (diagnosed during admission in 08/2023 for acute hypoxic respiratory failure in the setting of COPD and influenza), aortic stenosis, HTN, HLD, tobacco abuse and AAA (s/p EVAR in 03/2021) who presents to the office today for 1 month follow-up.  He was last examined by Cadence Furth, PA in 11/2023 and reported worsening lower extremity edema and had recently been started on steroids. He was taking Torsemide  20 mg daily. Weight was at 202 lbs and he did have 1+ pitting edema on examination. Torsemide  was increased to 40 mg daily for 3 days then down to 20 mg daily. It was felt that symptoms would likely improve once he had finished his course of steroids. Repeat labs at that time did show his BNP was mildly elevated at 121 and K+ was at 4.3 with creatinine at 0.99. Most recent labs on 12/17/2023 showed K+ was at 3.5 and creatinine at 1.15.  In talking with the patient today, he reports still having lower extremity edema. Says that he did stop his steroids last week but symptoms have not significantly improved. He has been taking Torsemide  20 mg daily and reports good urination with this. He does try to limit his sodium intake. Consumes 3-4 bottles of water a day. He has baseline dyspnea on exertion in the setting of COPD but no specific orthopnea or PND. On 2L [place]. No recent exertional chest pain or palpitations.   Studies Reviewed:   EKG: EKG is not ordered today.  Echocardiogram: 08/2023 IMPRESSIONS     1. Left ventricular ejection fraction, by estimation, is 60 to 65%. The  left ventricle has normal function. The left ventricle has  no regional  wall motion abnormalities. Left ventricular diastolic parameters are  consistent with Grade I diastolic  dysfunction (impaired relaxation). Elevated left ventricular end-diastolic  pressure.   2. Right ventricular systolic function is normal. The right ventricular  size is mildly enlarged. Tricuspid regurgitation signal is inadequate for  assessing PA pressure.   3. Left atrial size was mildly dilated.   4. The mitral valve is abnormal. No evidence of mitral valve  regurgitation. No evidence of mitral stenosis. Severe mitral annular  calcification.   5. The aortic valve is tricuspid. There is moderate calcification of the  aortic valve. Aortic valve regurgitation is not visualized. Mild aortic  valve stenosis. Aortic valve area, by VTI measures 2.12 cm. Aortic valve  mean gradient measures 11.0 mmHg.  Aortic valve Vmax measures 2.37 m/s.   6. Aortic dilatation noted. There is mild dilatation of the aortic root,  measuring 40 mm. Normal size of ascending aorta.   7. The inferior vena cava is normal in size with greater than 50%  respiratory variability, suggesting right atrial pressure of 3 mmHg.   Comparison(s): No prior Echocardiogram.    Risk Assessment/Calculations:    CHA2DS2-VASc Score = 4   This indicates a 4.8% annual risk of stroke. The patient's score is based upon: CHF History: 0 HTN History: 1 Diabetes History: 0 Stroke History: 0 Vascular Disease History: 1 Age Score: 2 Gender Score: 0  Physical Exam:   VS:  BP 132/68   Pulse 68   Ht 5' 7 (1.702 m)   Wt 200 lb 3.2 oz (90.8 kg)   SpO2 97% Comment: on 2L [place] via [place]  BMI 31.36 kg/m    Wt Readings from Last 3 Encounters:  12/30/23 200 lb 3.2 oz (90.8 kg)  12/03/23 202 lb (91.6 kg)  10/07/23 194 lb 9.6 oz (88.3 kg)     GEN: Well nourished, well developed male appearing in no acute distress NECK: No JVD; No carotid bruits CARDIAC: RRR, 2/6 SEM along RUSB.  RESPIRATORY:  Clear to  auscultation without rales, wheezing or rhonchi  ABDOMEN: Appears non-distended. No obvious abdominal masses. EXTREMITIES: No clubbing or cyanosis. 1+ pitting edema up to mid-shins bilaterally.  Distal pedal pulses are 2+ bilaterally.   Assessment and Plan:   1. Coronary artery disease involving native coronary artery of native heart without angina pectoris - He previously underwent CABG in 2014 as outlined above. Echocardiogram in 08/2023 showed a preserved EF of 60 to 65% with no regional wall motion abnormalities. - While his activity is limited given his respiratory status, he denies any specific chest pain. Continue current medical therapy with Atorvastatin  40 mg daily. He is not on ASA given the need for anticoagulation.  2. S/P AAA (abdominal aortic aneurysm) repair - Previously underwent repair in 03/2021. Ultrasound in 04/2023 showed no evidence of endoleak. Followed by Vascular Surgery.  3. Paroxysmal atrial fibrillation (HCC) - Diagnosed during his admission earlier this year and he did convert back to normal sinus rhythm.  Maintaining normal sinus rhythm by examination today. Continue Cardizem  CD 180 mg daily for rate control. - He is on Eliquis  5 mg twice daily for anticoagulation which is the appropriate dose given his age, weight and renal function.  4. Aortic Stenosis - Echocardiogram in 08/2023 showed mild aortic valve stenosis and mild dilatation of the aortic root at 40 mm. Would anticipate repeat imaging in 1 year for reassessment.  5. Essential hypertension - BP is well-controlled at 132/68 during today's visit. Continue current medical therapy with Cardizem  CD 180 mg daily.  6. Hyperlipidemia LDL goal <70 - FLP in 11/2023 showed total cholesterol 144, triglycerides 38, HDL 66 and LDL 70. Continue current medical therapy with Atorvastatin  40 mg daily.  7. Lower Extremity Edema - He still has 1+ pitting edema on examination today and as discussed previously, this is  possibly secondary to steroid use. Given his persistent symptoms, will titrate Torsemide  from 20 mg daily to 20 mg daily alternating with 40 mg daily. Repeat BMET in 7-10 days. He will initially plan to increase potassium intake in his diet but if found to be hypokalemic, will need to start K-dur.   Signed, Dorma Gash, PA-C

## 2024-01-04 DIAGNOSIS — J449 Chronic obstructive pulmonary disease, unspecified: Secondary | ICD-10-CM | POA: Diagnosis not present

## 2024-01-07 ENCOUNTER — Other Ambulatory Visit (HOSPITAL_COMMUNITY)
Admission: RE | Admit: 2024-01-07 | Discharge: 2024-01-07 | Disposition: A | Discharge status: Home / Self Care | Source: Ambulatory Visit | Attending: Student | Admitting: Student

## 2024-01-07 ENCOUNTER — Ambulatory Visit: Payer: Self-pay | Admitting: Student

## 2024-01-07 DIAGNOSIS — I10 Essential (primary) hypertension: Secondary | ICD-10-CM | POA: Insufficient documentation

## 2024-01-07 DIAGNOSIS — Z79899 Other long term (current) drug therapy: Secondary | ICD-10-CM | POA: Diagnosis not present

## 2024-01-07 LAB — BASIC METABOLIC PANEL WITH GFR
Anion gap: 12 (ref 5–15)
BUN: 21 mg/dL (ref 8–23)
CO2: 39 mmol/L — ABNORMAL HIGH (ref 22–32)
Calcium: 9.2 mg/dL (ref 8.9–10.3)
Chloride: 89 mmol/L — ABNORMAL LOW (ref 98–111)
Creatinine, Ser: 1.09 mg/dL (ref 0.61–1.24)
GFR, Estimated: >60 mL/min (ref >60)
Glucose, Bld: 137 mg/dL — ABNORMAL HIGH (ref 70–99)
Potassium: 2.9 mmol/L — ABNORMAL LOW (ref 3.5–5.1)
Sodium: 140 mmol/L (ref 135–145)

## 2024-01-07 MED ORDER — POTASSIUM CHLORIDE CRYS ER 20 MEQ PO TBCR: EXTENDED_RELEASE_TABLET | ORAL | 3 refills | Status: DC

## 2024-01-07 NOTE — Telephone Encounter (Signed)
-----   Message from Laymon CHRISTELLA Qua sent at 01/07/2024 10:07 AM EDT ----- Please let the patient know that his kidney function remains stable but potassium is low at 2.9. Would recommend starting K-dur 20 mEq daily but have him take 40 mEq today and tomorrow. Repeat BMET  in 1 week. How has his swelling been doing since titrating Lasix ? ----- Message ----- From: Interface, [facility] [place] Sent: 01/07/2024   9:14 AM EDT To: Laymon CHRISTELLA Qua, PA-C

## 2024-01-07 NOTE — Telephone Encounter (Signed)
 The patient has been notified of the result and verbalized understanding.  All questions (if any) were answered. Salima Rumer, LPN 3/72/7974 4:82 PM  Orders placed

## 2024-01-17 ENCOUNTER — Other Ambulatory Visit (HOSPITAL_COMMUNITY)
Admission: RE | Admit: 2024-01-17 | Discharge: 2024-01-17 | Disposition: A | Discharge status: Home / Self Care | Source: Ambulatory Visit | Attending: Student | Admitting: Student

## 2024-01-17 ENCOUNTER — Ambulatory Visit: Payer: Self-pay | Admitting: Student

## 2024-01-17 DIAGNOSIS — Z79899 Other long term (current) drug therapy: Secondary | ICD-10-CM | POA: Insufficient documentation

## 2024-01-17 LAB — BASIC METABOLIC PANEL WITH GFR
Anion gap: 9 (ref 5–15)
BUN: 14 mg/dL (ref 8–23)
CO2: 35 mmol/L — ABNORMAL HIGH (ref 22–32)
Calcium: 9 mg/dL (ref 8.9–10.3)
Chloride: 96 mmol/L — ABNORMAL LOW (ref 98–111)
Creatinine, Ser: 1.09 mg/dL (ref 0.61–1.24)
GFR, Estimated: >60 mL/min (ref >60)
Glucose, Bld: 98 mg/dL (ref 70–99)
Potassium: 3.9 mmol/L (ref 3.5–5.1)
Sodium: 140 mmol/L (ref 135–145)

## 2024-01-20 DIAGNOSIS — J45901 Unspecified asthma with (acute) exacerbation: Secondary | ICD-10-CM | POA: Diagnosis not present

## 2024-02-20 DIAGNOSIS — J45901 Unspecified asthma with (acute) exacerbation: Secondary | ICD-10-CM | POA: Diagnosis not present

## 2024-04-07 ENCOUNTER — Ambulatory Visit: Attending: Student | Admitting: Student

## 2024-04-07 ENCOUNTER — Encounter: Payer: Self-pay | Admitting: Student

## 2024-04-07 VITALS — BP 122/76 | HR 84 | Ht 67.0 in | Wt 188.0 lb

## 2024-04-07 DIAGNOSIS — I480 Paroxysmal atrial fibrillation: Secondary | ICD-10-CM

## 2024-04-07 DIAGNOSIS — E785 Hyperlipidemia, unspecified: Secondary | ICD-10-CM

## 2024-04-07 DIAGNOSIS — Z9889 Other specified postprocedural states: Secondary | ICD-10-CM

## 2024-04-07 DIAGNOSIS — Z8679 Personal history of other diseases of the circulatory system: Secondary | ICD-10-CM

## 2024-04-07 DIAGNOSIS — I2510 Atherosclerotic heart disease of native coronary artery without angina pectoris: Secondary | ICD-10-CM | POA: Diagnosis not present

## 2024-04-07 DIAGNOSIS — I10 Essential (primary) hypertension: Secondary | ICD-10-CM

## 2024-04-07 DIAGNOSIS — R600 Localized edema: Secondary | ICD-10-CM

## 2024-04-07 DIAGNOSIS — I350 Nonrheumatic aortic (valve) stenosis: Secondary | ICD-10-CM

## 2024-04-07 MED ORDER — TORSEMIDE 20 MG PO TABS: 20.0000 mg | ORAL_TABLET | Freq: Every day | ORAL | 3 refills | Status: AC

## 2024-04-07 MED ORDER — POTASSIUM CHLORIDE CRYS ER 20 MEQ PO TBCR: 20.0000 meq | EXTENDED_RELEASE_TABLET | Freq: Every day | ORAL | 3 refills | Status: AC

## 2024-04-07 NOTE — Patient Instructions (Addendum)
 Medication Instructions:  Your physician recommends that you continue on your current medications as directed. Please refer to the Current Medication list given to you today.   Labwork: None today  Testing/Procedures: None today  Follow-Up: 6 months [place] Strader,PA-C  Any Other Special Instructions Will Be Listed Below (If Applicable).  If you need a refill on your cardiac medications before your next appointment, please call your pharmacy.

## 2024-04-07 NOTE — Progress Notes (Signed)
 Cardiology Office Note    Date:  04/07/2024  ID:  [name], [name] 1946/02/23, MRN [id number] Cardiologist: Vinie JAYSON Maxcy, MD --> patient requests to follow-up in [facility]  Cardiology APP:  Johnson Laymon HERO, PA-C {  :  History of Present Illness:    [name] is a 78 y.o. male with past medical history of CAD (s/p CABG in 2014 with LIMA to LAD, SVG to OM and SVG to PDA), persistent atrial fibrillation (diagnosed during admission in 08/2023 for acute hypoxic respiratory failure in the setting of COPD and influenza), aortic stenosis, HTN, HLD, tobacco abuse and AAA (s/p EVAR in 03/2021) who presents to the office today for 53-month follow-up.  He was last examined by myself in 12/2023 and reported still having some lower extremity edema despite dose adjustment of his diuretic but had recently stopped his course of steroids as this was frequently felt to be contributing to symptoms. Torsemide  was titrated from 20 mg daily to 20 mg daily alternating with 40 mg daily with plans for follow-up labs. Follow-up labs showed his creatinine remained stable at 1.09 but K+ was low at 2.9 and he was started on potassium supplementation. Repeat labs on 01/17/2024 showed his potassium had normalized to 3.9 and creatinine remained stable at 1.09.  In talking with the patient today, he reports much improvement in his volume status since his last office visit. He has made dietary changes and is also no longer on steroids. Says that his breathing has significantly improved as he has lost over 12 pounds. He has been able to reduce his diuretic and is currently taking Torsemide  10 mg daily and did reduce potassium to 20 mEq daily. He does have follow-up labs with his PCP in the next few weeks. He denies any specific orthopnea, PND or pitting edema. No recent chest pain or palpitations. Remains on Eliquis  for anticoagulation with no reports of melena, hematochezia or hematuria.  Studies Reviewed:   EKG:  EKG is not ordered today.  Echocardiogram: 08/2023 IMPRESSIONS     1. Left ventricular ejection fraction, by estimation, is 60 to 65%. The  left ventricle has normal function. The left ventricle has no regional  wall motion abnormalities. Left ventricular diastolic parameters are  consistent with Grade I diastolic  dysfunction (impaired relaxation). Elevated left ventricular end-diastolic  pressure.   2. Right ventricular systolic function is normal. The right ventricular  size is mildly enlarged. Tricuspid regurgitation signal is inadequate for  assessing PA pressure.   3. Left atrial size was mildly dilated.   4. The mitral valve is abnormal. No evidence of mitral valve  regurgitation. No evidence of mitral stenosis. Severe mitral annular  calcification.   5. The aortic valve is tricuspid. There is moderate calcification of the  aortic valve. Aortic valve regurgitation is not visualized. Mild aortic  valve stenosis. Aortic valve area, by VTI measures 2.12 cm. Aortic valve  mean gradient measures 11.0 mmHg.  Aortic valve Vmax measures 2.37 m/s.   6. Aortic dilatation noted. There is mild dilatation of the aortic root,  measuring 40 mm. Normal size of ascending aorta.   7. The inferior vena cava is normal in size with greater than 50%  respiratory variability, suggesting right atrial pressure of 3 mmHg.   Comparison(s): No prior Echocardiogram.    Risk Assessment/Calculations:    CHA2DS2-VASc Score = 4  This indicates a 4.8% annual risk of stroke. The patient's score is based upon: CHF History: 0 HTN  History: 1 Diabetes History: 0 Stroke History: 0 Vascular Disease History: 1 Age Score: 2 Gender Score: 0   Physical Exam:   VS:  BP 122/76   Pulse 84   Ht 5' 7 (1.702 m)   Wt 188 lb (85.3 kg)   SpO2 94% Comment: on oxygen 2 liters via [place]  BMI 29.44 kg/m    Wt Readings from Last 3 Encounters:  04/07/24 188 lb (85.3 kg)  12/30/23 200 lb 3.2 oz (90.8 kg)   12/03/23 202 lb (91.6 kg)     GEN: Well nourished, well developed male appearing in no acute distress NECK: No JVD; No carotid bruits CARDIAC: RRR, 2/6 SEM along RUSB.  RESPIRATORY:  Clear to auscultation without rales, wheezing or rhonchi. On 2L [place].   ABDOMEN: Appears non-distended. No obvious abdominal masses. EXTREMITIES: No clubbing or cyanosis. No pitting edema.  Distal pedal pulses are 2+ bilaterally.   Assessment and Plan:   1. Coronary artery disease involving native coronary artery of native heart without angina pectoris - He is s/p CABG in 2014 and most recent echocardiogram in 08/2023 showed a preserved EF with no regional wall motion abnormalities. He denies any recent chest pain when doing his routine activities an his respiratory status has improved. - Continue Atorvastatin  40 mg daily. He is not on ASA given the need for anticoagulation.  2. S/P AAA (abdominal aortic aneurysm) repair - Followed by Vascular Surgery. Underwent repair in 03/2021 and most recent ultrasound in 04/2023 showed no evidence of endoleak. Ct Chest in 11/2023 did show his aortic root was enlarged at 4.4 cm and ascending aorta was at 4 cm with repeat imaging recommended in 1 year for reassessment.   3. Paroxysmal atrial fibrillation (HCC) - Diagnosed during admission earlier this year and he did convert back to normal sinus rhythm. He denies any recent palpitations and is in normal sinus rhythm by examination today. Continue Cardizem  CD 180 mg daily for rate-control. - No reports of active bleeding. Remains on Eliquis  5 mg twice daily for anticoagulation which is the appropriate dose given his current age, weight and renal function (creatinine at 1.09 when checked in 01/2024). CBC in 11/2023 showed his hemoglobin was stable at 13.8 with platelets at 183 K.   4. Essential hypertension - BP is well-controlled at 122/76 during today's visit. Continue Cardizem  CD 180 mg daily.  5. Hyperlipidemia LDL goal  <70 - LDL was at 70 when checked in 11/2023. Continue atorvastatin  40 mg daily.  6. Aortic valve stenosis, etiology of cardiac valve disease unspecified - Most recent echocardiogram in 08/2023 showed his aortic stenosis was in a mild range and he did have mild dilatation of the aortic root at 40 mm. Would plan for follow-up imaging next year.  7. Bilateral lower extremity edema - Symptoms have significantly improved since his last visit since now being off steroids and due to dietary changes. Will continue with Torsemide  10 mg daily with associated potassium supplementation. He is scheduled for follow-up labs within the next few weeks and prefers to have these with his PCP.  Signed, Laymon CHRISTELLA Qua, PA-C

## 2024-04-13 ENCOUNTER — Ambulatory Visit: Admitting: Student

## 2024-04-18 DIAGNOSIS — Z23 Encounter for immunization: Secondary | ICD-10-CM | POA: Diagnosis not present

## 2024-04-21 DIAGNOSIS — J45901 Unspecified asthma with (acute) exacerbation: Secondary | ICD-10-CM | POA: Diagnosis not present

## 2024-05-29 DIAGNOSIS — Z125 Encounter for screening for malignant neoplasm of prostate: Secondary | ICD-10-CM | POA: Diagnosis not present

## 2024-05-29 DIAGNOSIS — R7309 Other abnormal glucose: Secondary | ICD-10-CM | POA: Diagnosis not present

## 2024-05-29 DIAGNOSIS — I10 Essential (primary) hypertension: Secondary | ICD-10-CM | POA: Diagnosis not present

## 2024-10-11 ENCOUNTER — Ambulatory Visit: Admitting: Student
